# Patient Record
Sex: Female | Born: 1955 | Race: White | Hispanic: No | Smoking: Current every day smoker
Health system: Southern US, Community
[De-identification: ages and names within clinical notes are randomized; demographics above are authoritative.]

## PROBLEM LIST (undated history)

## (undated) DIAGNOSIS — S82409A Unspecified fracture of shaft of unspecified fibula, initial encounter for closed fracture: Secondary | ICD-10-CM

## (undated) DIAGNOSIS — I340 Nonrheumatic mitral (valve) insufficiency: Secondary | ICD-10-CM

## (undated) DIAGNOSIS — S82209A Unspecified fracture of shaft of unspecified tibia, initial encounter for closed fracture: Secondary | ICD-10-CM

## (undated) DIAGNOSIS — I4891 Unspecified atrial fibrillation: Secondary | ICD-10-CM

## (undated) DIAGNOSIS — K259 Gastric ulcer, unspecified as acute or chronic, without hemorrhage or perforation: Secondary | ICD-10-CM

## (undated) DIAGNOSIS — F418 Other specified anxiety disorders: Secondary | ICD-10-CM

## (undated) DIAGNOSIS — N289 Disorder of kidney and ureter, unspecified: Secondary | ICD-10-CM

## (undated) DIAGNOSIS — E785 Hyperlipidemia, unspecified: Secondary | ICD-10-CM

## (undated) DIAGNOSIS — D649 Anemia, unspecified: Secondary | ICD-10-CM

## (undated) DIAGNOSIS — K2980 Duodenitis without bleeding: Secondary | ICD-10-CM

## (undated) DIAGNOSIS — N1330 Unspecified hydronephrosis: Secondary | ICD-10-CM

## (undated) DIAGNOSIS — E43 Unspecified severe protein-calorie malnutrition: Secondary | ICD-10-CM

## (undated) DIAGNOSIS — D3500 Benign neoplasm of unspecified adrenal gland: Secondary | ICD-10-CM

## (undated) DIAGNOSIS — K852 Alcohol induced acute pancreatitis without necrosis or infection: Secondary | ICD-10-CM

## (undated) DIAGNOSIS — I10 Essential (primary) hypertension: Secondary | ICD-10-CM

## (undated) DIAGNOSIS — K589 Irritable bowel syndrome without diarrhea: Secondary | ICD-10-CM

## (undated) DIAGNOSIS — K859 Acute pancreatitis without necrosis or infection, unspecified: Secondary | ICD-10-CM

## (undated) DIAGNOSIS — I739 Peripheral vascular disease, unspecified: Secondary | ICD-10-CM

## (undated) DIAGNOSIS — R4701 Aphasia: Secondary | ICD-10-CM

## (undated) HISTORY — PX: ESOPHAGOGASTRODUODENOSCOPY: SHX1529

## (undated) HISTORY — PX: ABDOMINAL HYSTERECTOMY: SHX81

## (undated) HISTORY — DX: Peripheral vascular disease, unspecified: I73.9

## (undated) HISTORY — PX: BONE MARROW BIOPSY: SHX199

## (undated) HISTORY — PX: OTHER SURGICAL HISTORY: SHX169

## (undated) HISTORY — DX: Alcohol induced acute pancreatitis without necrosis or infection: K85.20

## (undated) HISTORY — DX: Gastric ulcer, unspecified as acute or chronic, without hemorrhage or perforation: K25.9

## (undated) HISTORY — DX: Unspecified atrial fibrillation: I48.91

## (undated) HISTORY — DX: Disorder of kidney and ureter, unspecified: N28.9

## (undated) HISTORY — DX: Acute pancreatitis without necrosis or infection, unspecified: K85.90

---

## 1999-11-17 ENCOUNTER — Encounter: Admission: RE | Admit: 1999-11-17 | Discharge: 1999-11-17 | Payer: Self-pay | Admitting: *Deleted

## 2000-12-19 ENCOUNTER — Encounter: Payer: Self-pay | Admitting: Internal Medicine

## 2000-12-19 ENCOUNTER — Encounter: Admission: RE | Admit: 2000-12-19 | Discharge: 2000-12-19 | Payer: Self-pay | Admitting: Internal Medicine

## 2002-08-10 ENCOUNTER — Encounter: Payer: Self-pay | Admitting: Family Medicine

## 2002-08-10 ENCOUNTER — Encounter: Admission: RE | Admit: 2002-08-10 | Discharge: 2002-08-10 | Payer: Self-pay | Admitting: Family Medicine

## 2002-08-14 ENCOUNTER — Encounter: Admission: RE | Admit: 2002-08-14 | Discharge: 2002-08-14 | Payer: Self-pay | Admitting: Family Medicine

## 2002-08-14 ENCOUNTER — Encounter: Payer: Self-pay | Admitting: Family Medicine

## 2002-09-15 ENCOUNTER — Encounter: Payer: Self-pay | Admitting: Urology

## 2002-09-15 ENCOUNTER — Ambulatory Visit (HOSPITAL_COMMUNITY): Admission: RE | Admit: 2002-09-15 | Discharge: 2002-09-15 | Payer: Self-pay | Admitting: Urology

## 2003-03-25 ENCOUNTER — Encounter: Admission: RE | Admit: 2003-03-25 | Discharge: 2003-03-25 | Payer: Self-pay | Admitting: Family Medicine

## 2003-04-03 DIAGNOSIS — K2980 Duodenitis without bleeding: Secondary | ICD-10-CM

## 2003-04-03 HISTORY — DX: Duodenitis without bleeding: K29.80

## 2003-04-06 ENCOUNTER — Encounter: Payer: Self-pay | Admitting: Internal Medicine

## 2003-04-06 DIAGNOSIS — K2980 Duodenitis without bleeding: Secondary | ICD-10-CM | POA: Insufficient documentation

## 2003-04-06 DIAGNOSIS — K449 Diaphragmatic hernia without obstruction or gangrene: Secondary | ICD-10-CM | POA: Insufficient documentation

## 2004-03-14 ENCOUNTER — Emergency Department (HOSPITAL_COMMUNITY): Admission: EM | Admit: 2004-03-14 | Discharge: 2004-03-14 | Payer: Self-pay | Admitting: Emergency Medicine

## 2004-05-09 ENCOUNTER — Emergency Department (HOSPITAL_COMMUNITY): Admission: EM | Admit: 2004-05-09 | Discharge: 2004-05-09 | Payer: Self-pay | Admitting: Emergency Medicine

## 2005-02-03 ENCOUNTER — Emergency Department (HOSPITAL_COMMUNITY): Admission: EM | Admit: 2005-02-03 | Discharge: 2005-02-03 | Payer: Self-pay | Admitting: Emergency Medicine

## 2005-03-27 ENCOUNTER — Encounter: Admission: RE | Admit: 2005-03-27 | Discharge: 2005-03-27 | Payer: Self-pay | Admitting: Nurse Practitioner

## 2006-04-02 DIAGNOSIS — F418 Other specified anxiety disorders: Secondary | ICD-10-CM

## 2006-04-02 DIAGNOSIS — K589 Irritable bowel syndrome without diarrhea: Secondary | ICD-10-CM

## 2006-04-02 HISTORY — DX: Irritable bowel syndrome, unspecified: K58.9

## 2006-04-02 HISTORY — DX: Other specified anxiety disorders: F41.8

## 2006-04-03 ENCOUNTER — Encounter: Admission: RE | Admit: 2006-04-03 | Discharge: 2006-07-02 | Payer: Self-pay | Admitting: Family Medicine

## 2006-12-31 ENCOUNTER — Ambulatory Visit: Payer: Self-pay | Admitting: Internal Medicine

## 2007-01-30 ENCOUNTER — Ambulatory Visit: Payer: Self-pay | Admitting: Internal Medicine

## 2007-06-10 DIAGNOSIS — F17200 Nicotine dependence, unspecified, uncomplicated: Secondary | ICD-10-CM

## 2007-06-10 DIAGNOSIS — K219 Gastro-esophageal reflux disease without esophagitis: Secondary | ICD-10-CM | POA: Insufficient documentation

## 2007-06-10 DIAGNOSIS — E785 Hyperlipidemia, unspecified: Secondary | ICD-10-CM

## 2007-06-10 DIAGNOSIS — I10 Essential (primary) hypertension: Secondary | ICD-10-CM | POA: Insufficient documentation

## 2007-06-10 DIAGNOSIS — N200 Calculus of kidney: Secondary | ICD-10-CM | POA: Insufficient documentation

## 2007-06-10 DIAGNOSIS — K589 Irritable bowel syndrome without diarrhea: Secondary | ICD-10-CM | POA: Insufficient documentation

## 2007-09-17 ENCOUNTER — Encounter: Payer: Self-pay | Admitting: Internal Medicine

## 2008-04-02 DIAGNOSIS — N1330 Unspecified hydronephrosis: Secondary | ICD-10-CM

## 2008-04-02 HISTORY — DX: Unspecified hydronephrosis: N13.30

## 2008-04-12 ENCOUNTER — Emergency Department (HOSPITAL_COMMUNITY): Admission: EM | Admit: 2008-04-12 | Discharge: 2008-04-12 | Payer: Self-pay | Admitting: Emergency Medicine

## 2008-06-23 ENCOUNTER — Encounter: Admission: RE | Admit: 2008-06-23 | Discharge: 2008-06-23 | Payer: Self-pay | Admitting: Internal Medicine

## 2008-12-14 ENCOUNTER — Emergency Department (HOSPITAL_COMMUNITY): Admission: EM | Admit: 2008-12-14 | Discharge: 2008-12-14 | Payer: Self-pay | Admitting: Emergency Medicine

## 2009-01-28 ENCOUNTER — Encounter: Admission: RE | Admit: 2009-01-28 | Discharge: 2009-01-28 | Payer: Self-pay | Admitting: Internal Medicine

## 2009-02-03 ENCOUNTER — Encounter: Admission: RE | Admit: 2009-02-03 | Discharge: 2009-02-03 | Payer: Self-pay | Admitting: Internal Medicine

## 2009-04-02 DIAGNOSIS — I10 Essential (primary) hypertension: Secondary | ICD-10-CM

## 2009-04-02 DIAGNOSIS — E785 Hyperlipidemia, unspecified: Secondary | ICD-10-CM

## 2009-04-02 HISTORY — DX: Essential (primary) hypertension: I10

## 2009-04-02 HISTORY — DX: Hyperlipidemia, unspecified: E78.5

## 2009-07-12 ENCOUNTER — Encounter: Admission: RE | Admit: 2009-07-12 | Discharge: 2009-07-12 | Payer: Self-pay | Admitting: Internal Medicine

## 2009-07-28 ENCOUNTER — Inpatient Hospital Stay (HOSPITAL_COMMUNITY): Admission: EM | Admit: 2009-07-28 | Discharge: 2009-07-29 | Payer: Self-pay | Admitting: Emergency Medicine

## 2009-07-29 ENCOUNTER — Encounter (INDEPENDENT_AMBULATORY_CARE_PROVIDER_SITE_OTHER): Payer: Self-pay | Admitting: Internal Medicine

## 2009-08-08 ENCOUNTER — Emergency Department (HOSPITAL_COMMUNITY): Admission: EM | Admit: 2009-08-08 | Discharge: 2009-08-08 | Payer: Self-pay | Admitting: Emergency Medicine

## 2009-11-14 ENCOUNTER — Encounter: Payer: Self-pay | Admitting: Cardiology

## 2009-12-19 ENCOUNTER — Emergency Department (HOSPITAL_COMMUNITY): Admission: EM | Admit: 2009-12-19 | Discharge: 2009-12-19 | Payer: Self-pay | Admitting: Emergency Medicine

## 2010-02-16 ENCOUNTER — Encounter: Payer: Self-pay | Admitting: Cardiology

## 2010-02-17 ENCOUNTER — Ambulatory Visit: Payer: Self-pay | Admitting: Cardiology

## 2010-02-17 DIAGNOSIS — Z9189 Other specified personal risk factors, not elsewhere classified: Secondary | ICD-10-CM | POA: Insufficient documentation

## 2010-02-17 DIAGNOSIS — R0681 Apnea, not elsewhere classified: Secondary | ICD-10-CM

## 2010-02-17 DIAGNOSIS — R0609 Other forms of dyspnea: Secondary | ICD-10-CM

## 2010-02-17 DIAGNOSIS — R0989 Other specified symptoms and signs involving the circulatory and respiratory systems: Secondary | ICD-10-CM

## 2010-02-20 ENCOUNTER — Telehealth: Payer: Self-pay | Admitting: Cardiology

## 2010-02-20 ENCOUNTER — Encounter: Payer: Self-pay | Admitting: Cardiology

## 2010-02-28 ENCOUNTER — Telehealth: Payer: Self-pay | Admitting: Cardiology

## 2010-03-02 DIAGNOSIS — R4701 Aphasia: Secondary | ICD-10-CM

## 2010-03-02 DIAGNOSIS — I340 Nonrheumatic mitral (valve) insufficiency: Secondary | ICD-10-CM

## 2010-03-02 HISTORY — DX: Aphasia: R47.01

## 2010-03-02 HISTORY — DX: Nonrheumatic mitral (valve) insufficiency: I34.0

## 2010-03-04 ENCOUNTER — Inpatient Hospital Stay (HOSPITAL_COMMUNITY)
Admission: EM | Admit: 2010-03-04 | Discharge: 2010-03-07 | Payer: Self-pay | Source: Home / Self Care | Attending: Internal Medicine | Admitting: Internal Medicine

## 2010-03-07 ENCOUNTER — Encounter (INDEPENDENT_AMBULATORY_CARE_PROVIDER_SITE_OTHER): Payer: Self-pay | Admitting: Internal Medicine

## 2010-03-22 ENCOUNTER — Emergency Department (HOSPITAL_COMMUNITY)
Admission: EM | Admit: 2010-03-22 | Discharge: 2010-03-22 | Payer: Self-pay | Source: Home / Self Care | Admitting: Emergency Medicine

## 2010-04-07 ENCOUNTER — Ambulatory Visit: Admit: 2010-04-07 | Payer: Self-pay

## 2010-04-07 ENCOUNTER — Ambulatory Visit: Admit: 2010-04-07 | Payer: Self-pay | Admitting: Cardiology

## 2010-05-02 NOTE — Assessment & Plan Note (Signed)
Summary: NP6/UNCONTROLLED BP/JML  Medications Added TOPROL XL 100 MG XR24H-TAB (METOPROLOL SUCCINATE) 1 by mouth daily ASPIRIN 81 MG  TABS (ASPIRIN) 1 by mouth daily LISINOPRIL 20 MG TABS (LISINOPRIL) one daily CYMBALTA 60 MG CPEP (DULOXETINE HCL) 1 by mouth daily KLONOPIN 0.5 MG TABS (CLONAZEPAM) 2 by mouth daily LIPITOR 10 MG TABS (ATORVASTATIN CALCIUM) 1 by mouth daily PROTONIX 40 MG SOLR (PANTOPRAZOLE SODIUM) 1 podaily PROZAC 40 MG CAPS (FLUOXETINE HCL) 1 by mouth daily DILTIAZEM HCL 120 MG TABS (DILTIAZEM HCL) 2  by mouth daily TRAZODONE HCL 100 MG TABS (TRAZODONE HCL) as needed CLONIDINE HCL 0.1 MG TABS (CLONIDINE HCL) one twice a day      Allergies Added: NKDA  Visit Type:  Initial Consult Primary Provider:  Tomi Bamberger, DNP  CC:  HTN.  History of Present Illness: The patient presents for difficult to control hypertension. She has apparently had this for some years. I did see an admission earlier this year hypertensive urgency with altered mental status. She had an echocardiogram which demonstrated no structural evidence of allergies other than mild LVH. He had CT and MRI of her head and neurology consult. Ultimately she was treated for her hypertension. However, this continues to be difficult to control. Recently she required clonidine and her primary provider office and had 0.1 mg daily added to her regimen. I did review other hospital records and seated in 2004 she had imaging of her kidneys and was found to have bilateral hydronephrosis. I am not sure if it was ever any treatment indicated for this or further evaluation. She denies other cardiac workup or history. She has multiple cardiovascular risk factors. However, she does not get substernal chest pressure, neck or arm discomfort. She has had some bleeding chest pain in the past.  She is mostly bothered by fatigue.  She says that she does not sleep well.  She does snore.  She will get dizzy occasionally and she relates this  to high blood pressure but she has not had any presyncope or syncope.  Current Medications (verified): 1)  Toprol Xl 100 Mg Xr24h-Tab (Metoprolol Succinate) .Marland Kitchen.. 1 By Mouth Daily 2)  Aspirin 81 Mg  Tabs (Aspirin) .Marland Kitchen.. 1 By Mouth Daily 3)  Lisinopril 10 Mg Tabs (Lisinopril) .Marland Kitchen.. 1 By Mouth Daily 4)  Cymbalta 60 Mg Cpep (Duloxetine Hcl) .Marland Kitchen.. 1 By Mouth Daily 5)  Klonopin 0.5 Mg Tabs (Clonazepam) .... 2 By Mouth Daily 6)  Lipitor 10 Mg Tabs (Atorvastatin Calcium) .Marland Kitchen.. 1 By Mouth Daily 7)  Protonix 40 Mg Solr (Pantoprazole Sodium) .Marland Kitchen.. 1 Podaily 8)  Prozac 40 Mg Caps (Fluoxetine Hcl) .Marland Kitchen.. 1 By Mouth Daily 9)  Diltiazem Hcl 120 Mg Tabs (Diltiazem Hcl) .... 2  By Mouth Daily 10)  Trazodone Hcl 100 Mg Tabs (Trazodone Hcl) .... As Needed  Allergies (verified): No Known Drug Allergies  Past History:  Past Medical History: DUODENITIS (ICD-535.60) HIATAL HERNIA (ICD-553.3) RENAL CALCULUS (ICD-592.0) HYPERLIPIDEMIA (ICD-272.4) HYPERTENSION (ICD-401.9) IBS (ICD-564.1) SMOKER (ICD-305.1) GERD (ICD-530.81)  Past Surgical History: Hysterectomy 1994 Tonsillectomy  Family History: Significant for father had diabetes, dyslipidemia, CVA  and  coronary artery disease.  Mother had CVA, diabetes mellitus, and  dyslipidemia.    Social History: Lives with husband and smokes a pack of cigarettes per  day and has been doing this for many years, at least greater than 10 years.  Drinks occasionally but denies illicit drug use.  She drives a school bus.  Review of Systems  Positive for headaches, joint pains. Negative for all other systems.  Vital Signs:  Patient profile:   55 year old female Height:      64 inches Weight:      188 pounds BMI:     32.39 Pulse rate:   76 / minute Resp:     16 per minute BP sitting:   154 / 102  (right arm)  Vitals Entered By: Marrion Coy, CNA (February 17, 2010 3:10 PM)  Physical Exam  General:  Well developed, well nourished, in no acute  distress. Head:  normocephalic and atraumatic Eyes:  PERRLA/EOM intact; conjunctiva and lids normal. Mouth:  Poor dentition. Oral mucosa normal. Neck:  Neck supple, no JVD. No masses, thyromegaly or abnormal cervical nodes. Chest Wall:  no deformities or breast masses noted Lungs:  Clear bilaterally to auscultation and percussion. Abdomen:  Bowel sounds positive; abdomen soft and non-tender without masses, organomegaly, or hernias noted. No hepatosplenomegaly. Msk:  Back normal, normal gait. Muscle strength and tone normal. Extremities:  No clubbing or cyanosis. Neurologic:  Alert and oriented x 3. Skin:  Intact without lesions or rashes. Cervical Nodes:  no significant adenopathy Axillary Nodes:  no significant adenopathy Inguinal Nodes:  no significant adenopathy Psych:  Normal affect.   Detailed Cardiovascular Exam  Neck    Carotids: Carotids full and equal bilaterally without bruits.  Carotids full and equal bilaterally without bruits.      Neck Veins: Normal, no JVD.  Normal, no JVD.    Heart    Inspection: no deformities or lifts noted.  no deformities or lifts noted.      Palpation: normal PMI with no thrills palpable.  normal PMI with no thrills palpable.      Auscultation: regular rate and rhythm, S1, S2 without murmurs, rubs, gallops, or clicks.  regular rate and rhythm, S1, S2 without murmurs, rubs, gallops, or clicks.    Vascular    Abdominal Aorta: no palpable masses, pulsations, or audible bruits.  no palpable masses, pulsations, or audible bruits.      Femoral Pulses: normal femoral pulses bilaterally.  normal femoral pulses bilaterally.      Pedal Pulses: normal pedal pulses bilaterally.  normal pedal pulses bilaterally.      Radial Pulses: normal radial pulses bilaterally.  normal radial pulses bilaterally.      Peripheral Circulation: no clubbing, cyanosis, or edema noted with normal capillary refill.  no clubbing, cyanosis, or edema noted with normal capillary  refill.     Impression & Recommendations:  Problem # 1:  HYPERTENSION (ICD-401.9) Her blood pressure has been somewhat difficult to control. I did review labs and she had a normal thyroid and urinalysis and basic metabolic profile except for a slightly low potassium recently. I am going to order renal Dopplers to evaluate a secondary etiology. Today I will increase her clonidine to b.i.d. and her lisinopril to 20 mg daily. She'll come back in about 2 weeks for a basic metabolic profile and followup. She will keep a blood pressure diary. Orders: Renal Artery Duplex (Renal Artery Duplex)  Problem # 2:  SMOKER (ICD-305.1) We discussed the need to stop smoking.  Problem # 3:  HYPERLIPIDEMIA (ICD-272.4) I reviewed recent blood tests and her LDL was 205. I will have her increase her Lipitor to 40 mg daily.  Problem # 4:  SNORING (ICD-786.09) The patient snorts his fatigue has headaches. I will send her for a sleep apnea study. This can contribute to difficult to control hypertension.  Other Orders: Sleep Disorder Referral (Sleep Disorder)  Patient Instructions: 1)  Your physician recommends that you schedule a follow-up appointment same day as renal doppler 2)  Your physician has recommended you make the following change in your medication: Increase Lisinopril to 20 mg a day and Clonidine 0.1 mg to twice a day 3)  Your physician has requested that you have a renal artery duplex. During this test, an ultrasound is used to evaluate blood flow to the kidneys. Allow one hour for this exam. Do not eat after midnight the day before and avoid carbonated beverages. Take your medications as you usually do. 4)  Your physician has recommended that you have a sleep study.  This test records several body functions during sleep, including:  brain activity, eye movement, oxygen and carbon dioxide blood levels, heart rate and rhythm, breathing rate and rhythm, the flow of air through your mouth and nose,  snoring, body muscle movements, and chest and belly movement. Prescriptions: CLONIDINE HCL 0.1 MG TABS (CLONIDINE HCL) one twice a day  #60 x 11   Entered by:   Charolotte Capuchin, RN   Authorized by:   Rollene Rotunda, MD, [facility]   Signed by:   Charolotte Capuchin, RN on 02/17/2010   Method used:   Electronically to        [facility] [phone]* (retail)       [address]       [place], [place]  [address]       Ph: [phone]       Fax: [phone]   RxID:   [id number] LISINOPRIL 20 MG TABS (LISINOPRIL) one daily  #30 x 11   Entered by:   Charolotte Capuchin, RN   Authorized by:   Rollene Rotunda, MD, [facility]   Signed by:   Charolotte Capuchin, RN on 02/17/2010   Method used:   Electronically to        [facility] [phone]* (retail)       [address]       [place], [place]  [address]       Ph: [phone]       Fax: [phone]   RxID:   [id number]

## 2010-05-02 NOTE — Letter (Signed)
Summary: Return To Work  [employer], Main Office  [address]   [place], [place] [address]   Phone: [phone]  Fax: [phone]        02/20/2010  TO: [facility] IT MAY CONCERN   RE: Krista Carter [id number] [id number]   The above named individual is under my medical care and may return to work on:  February 27, 2010 with no restrictions.  If you have any further questions or need additional information, please call.     Sincerely,    Charolotte Capuchin, RN for Dr Rollene Rotunda

## 2010-05-02 NOTE — Progress Notes (Signed)
Summary: addition to rtn ot work note   Phone Note Call from Patient   Caller: Patient Reason for Call: Talk to Nurse Summary of Call: pt needs rtn to work note dated the 18th, to state she can go back with no restrictions and fax to [phone] Initial call taken by: Glynda Jaeger,  February 20, 2010 11:20 AM  Follow-up for Phone Call        pt needs the note to be dates 02/27/2010 is previously discussed - just needs no restrictions added - done and will be faxed at the pts request Follow-up by: Charolotte Capuchin, RN,  February 20, 2010 12:24 PM

## 2010-05-02 NOTE — Letter (Signed)
Summary: Return To Work  [employer], Main Office  [address]   [place], [place] [address]   Phone: [phone]  Fax: [phone]        02/17/2010    TO: [facility] IT MAY CONCERN   RE: Krista Carter [id number] [id number]   The above named individual is under my medical care and may return to work on:  Monday February 27, 2010  If you have any further questions or need additional information, please call.       Sincerely,      Charolotte Capuchin, RN for Dr Rollene Rotunda

## 2010-05-02 NOTE — Progress Notes (Signed)
Summary: calling about get time off work due to /returning your call   Phone Note Call from Patient Call back at Home Phone [phone] Call back at [phone]    Caller: Patient Summary of Call: Pt calling about being taken out work due to her health,pt went back to work yesterday and had very bad pains Initial call taken by: Judie Grieve,  February 28, 2010 10:20 AM  Follow-up for Phone Call        [facility] for call back.  Layne Benton, RN, BSN  February 28, 2010 10:38 AM   Additional Follow-up for Phone Call Additional follow up Details #1::        Phone Call Completed SEE OTHER NOTE.PHONE CALL COMPLETED Scherrie Bateman, LPN  February 28, 2010 3:25 PM Additional Follow-up by: Roe Coombs,  February 28, 2010 1:21 PM

## 2010-05-02 NOTE — Progress Notes (Signed)
Summary: pt needs note for work   Phone Note Call from Patient Call back at [phone]   Caller: Patient Reason for Call: Talk to Nurse, Talk to Doctor Summary of Call: pt needs a note for work because she is not able to go back yet her arms, legs, and back still hurt Initial call taken by: Omer Jack,  February 28, 2010 3:04 PM  Follow-up for Phone Call        Phone Call Completed SPOKE WITH PT AND INFORMED PT TO CALL PMD FOR NOTE FOR WORK S/S LISTED IN MESSAGE ARE NOT HEART RELATED AND  WE ARE SEING PT FOR HYPERTENSION WHCIH WOULD NOT BE CAUSING LIMB AND BACK PAIN.PT VERBALIZED UNDERSTANDING. Follow-up by: Scherrie Bateman, LPN,  February 28, 2010 3:24 PM

## 2010-05-02 NOTE — Miscellaneous (Signed)
  Clinical Lists Changes  Observations: Added new observation of ECHOINTERP:  Left ventricle: The cavity size was normal. Wall thickness was   increased in a pattern of mild LVH. Systolic function was normal.   The estimated ejection fraction was in the range of 60% to 65%. Wall   motion was normal; there were no regional wall motion abnormalities.   Impressions:    - No cardiac source of emboli was indentified. (07/28/2009 9:43)      Echocardiogram  Procedure date:  07/28/2009  Findings:       Left ventricle: The cavity size was normal. Wall thickness was   increased in a pattern of mild LVH. Systolic function was normal.   The estimated ejection fraction was in the range of 60% to 65%. Wall   motion was normal; there were no regional wall motion abnormalities.   Impressions:    - No cardiac source of emboli was indentified.

## 2010-05-04 NOTE — Letter (Signed)
Summary: [facility] Office Note   [facility] Office Note   Imported By: Roderic Ovens 04/13/2010 14:03:51  _____________________________________________________________________  External Attachment:    Type:   Image     Comment:   External Document

## 2010-06-07 ENCOUNTER — Emergency Department (HOSPITAL_COMMUNITY)
Admission: EM | Admit: 2010-06-07 | Discharge: 2010-06-07 | Discharge status: Against Medical Advice | Payer: BC Managed Care – PPO | Attending: Emergency Medicine | Admitting: Emergency Medicine

## 2010-06-07 DIAGNOSIS — R110 Nausea: Secondary | ICD-10-CM | POA: Insufficient documentation

## 2010-06-07 DIAGNOSIS — H53149 Visual discomfort, unspecified: Secondary | ICD-10-CM | POA: Insufficient documentation

## 2010-06-07 DIAGNOSIS — R51 Headache: Secondary | ICD-10-CM | POA: Insufficient documentation

## 2010-06-13 LAB — HEPATIC FUNCTION PANEL
ALT: 12 U/L (ref 0–35)
Alkaline Phosphatase: 76 U/L (ref 39–117)
Bilirubin, Direct: 0.1 mg/dL (ref 0.0–0.3)
Indirect Bilirubin: 0.3 mg/dL (ref 0.3–0.9)
Total Bilirubin: 0.4 mg/dL (ref 0.3–1.2)
Total Protein: 6.1 g/dL (ref 6.0–8.3)

## 2010-06-13 LAB — BASIC METABOLIC PANEL
BUN: 11 mg/dL (ref 6–23)
BUN: 15 mg/dL (ref 6–23)
CO2: 28 mEq/L (ref 19–32)
Chloride: 100 mEq/L (ref 96–112)
Chloride: 103 mEq/L (ref 96–112)
Creatinine, Ser: 0.83 mg/dL (ref 0.4–1.2)
Glucose, Bld: 97 mg/dL (ref 70–99)
Potassium: 3 mEq/L — ABNORMAL LOW (ref 3.5–5.1)

## 2010-06-13 LAB — GLUCOSE, CAPILLARY
Glucose-Capillary: 103 mg/dL — ABNORMAL HIGH (ref 70–99)
Glucose-Capillary: 119 mg/dL — ABNORMAL HIGH (ref 70–99)
Glucose-Capillary: 215 mg/dL — ABNORMAL HIGH (ref 70–99)
Glucose-Capillary: 98 mg/dL (ref 70–99)
Glucose-Capillary: 99 mg/dL (ref 70–99)

## 2010-06-13 LAB — POCT CARDIAC MARKERS
CKMB, poc: <1 ng/mL — ABNORMAL LOW (ref 1.0–8.0)
Myoglobin, poc: 67.6 ng/mL (ref 12–200)
Troponin i, poc: <0.05 ng/mL (ref 0.00–0.09)

## 2010-06-13 LAB — URINALYSIS, ROUTINE W REFLEX MICROSCOPIC
Glucose, UA: NEGATIVE mg/dL
Specific Gravity, Urine: 1.012 (ref 1.005–1.030)
pH: 5.5 (ref 5.0–8.0)

## 2010-06-13 LAB — LIPID PANEL
Cholesterol: 165 mg/dL (ref 0–200)
Cholesterol: 165 mg/dL (ref 0–200)
LDL Cholesterol: 95 mg/dL (ref 0–99)
LDL Cholesterol: 98 mg/dL (ref 0–99)
Total CHOL/HDL Ratio: 3.5 RATIO

## 2010-06-13 LAB — CBC
HCT: 40.2 % (ref 36.0–46.0)
MCHC: 33.8 g/dL (ref 30.0–36.0)
MCV: 92 fL (ref 78.0–100.0)
RDW: 13.8 % (ref 11.5–15.5)

## 2010-06-13 LAB — DIFFERENTIAL
Basophils Absolute: 0.1 10*3/uL (ref 0.0–0.1)
Basophils Relative: 1 % (ref 0–1)
Eosinophils Relative: 5 % (ref 0–5)
Monocytes Absolute: 0.5 10*3/uL (ref 0.1–1.0)

## 2010-06-13 LAB — COMPREHENSIVE METABOLIC PANEL
Alkaline Phosphatase: 76 U/L (ref 39–117)
BUN: 14 mg/dL (ref 6–23)
Chloride: 97 mEq/L (ref 96–112)
Glucose, Bld: 121 mg/dL — ABNORMAL HIGH (ref 70–99)
Potassium: 3.4 mEq/L — ABNORMAL LOW (ref 3.5–5.1)
Total Bilirubin: 0.4 mg/dL (ref 0.3–1.2)
Total Protein: 6 g/dL (ref 6.0–8.3)

## 2010-06-13 LAB — PROTIME-INR: INR: 1.01 (ref 0.00–1.49)

## 2010-06-20 LAB — CBC
HCT: 39 % (ref 36.0–46.0)
HCT: 39.2 % (ref 36.0–46.0)
Hemoglobin: 13.4 g/dL (ref 12.0–15.0)
Hemoglobin: 13.5 g/dL (ref 12.0–15.0)
Hemoglobin: 13.6 g/dL (ref 12.0–15.0)
MCHC: 34.5 g/dL (ref 30.0–36.0)
MCHC: 34.9 g/dL (ref 30.0–36.0)
MCV: 93.1 fL (ref 78.0–100.0)
MCV: 93.2 fL (ref 78.0–100.0)
Platelets: 167 10*3/uL (ref 150–400)
Platelets: 173 10*3/uL (ref 150–400)
RBC: 4.12 MIL/uL (ref 3.87–5.11)
RBC: 4.19 MIL/uL (ref 3.87–5.11)
RBC: 4.21 MIL/uL (ref 3.87–5.11)
RDW: 14.3 % (ref 11.5–15.5)
RDW: 14.6 % (ref 11.5–15.5)
WBC: 5.4 10*3/uL (ref 4.0–10.5)
WBC: 6.3 10*3/uL (ref 4.0–10.5)
WBC: 9.2 10*3/uL (ref 4.0–10.5)

## 2010-06-20 LAB — COMPREHENSIVE METABOLIC PANEL
ALT: 15 U/L (ref 0–35)
ALT: 15 U/L (ref 0–35)
AST: 15 U/L (ref 0–37)
AST: 19 U/L (ref 0–37)
Albumin: 3.5 g/dL (ref 3.5–5.2)
Albumin: 3.5 g/dL (ref 3.5–5.2)
Alkaline Phosphatase: 72 U/L (ref 39–117)
Alkaline Phosphatase: 73 U/L (ref 39–117)
BUN: 11 mg/dL (ref 6–23)
BUN: 11 mg/dL (ref 6–23)
CO2: 26 mEq/L (ref 19–32)
CO2: 26 mEq/L (ref 19–32)
Calcium: 8.8 mg/dL (ref 8.4–10.5)
Calcium: 8.9 mg/dL (ref 8.4–10.5)
Chloride: 105 mEq/L (ref 96–112)
Chloride: 109 mEq/L (ref 96–112)
Creatinine, Ser: 0.73 mg/dL (ref 0.4–1.2)
Creatinine, Ser: 0.82 mg/dL (ref 0.4–1.2)
GFR calc Af Amer: >60 mL/min (ref >60)
GFR calc Af Amer: >60 mL/min (ref >60)
GFR calc non Af Amer: >60 mL/min (ref >60)
GFR calc non Af Amer: >60 mL/min (ref >60)
Glucose, Bld: 91 mg/dL (ref 70–99)
Glucose, Bld: 93 mg/dL (ref 70–99)
Potassium: 3.2 mEq/L — ABNORMAL LOW (ref 3.5–5.1)
Potassium: 3.7 mEq/L (ref 3.5–5.1)
Sodium: 138 mEq/L (ref 135–145)
Sodium: 143 mEq/L (ref 135–145)
Total Bilirubin: 0.5 mg/dL (ref 0.3–1.2)
Total Bilirubin: 0.7 mg/dL (ref 0.3–1.2)
Total Protein: 6.4 g/dL (ref 6.0–8.3)
Total Protein: 6.4 g/dL (ref 6.0–8.3)

## 2010-06-20 LAB — POCT I-STAT, CHEM 8
BUN: 14 mg/dL (ref 6–23)
Calcium, Ion: 1.1 mmol/L — ABNORMAL LOW (ref 1.12–1.32)
Calcium, Ion: 1.02 mmol/L — ABNORMAL LOW (ref 1.12–1.32)
Chloride: 105 mEq/L (ref 96–112)
Chloride: 110 mEq/L (ref 96–112)
Creatinine, Ser: 0.8 mg/dL (ref 0.4–1.2)
Creatinine, Ser: 0.8 mg/dL (ref 0.4–1.2)
Glucose, Bld: 98 mg/dL (ref 70–99)
Glucose, Bld: 79 mg/dL (ref 70–99)
HCT: 45 % (ref 36.0–46.0)
HCT: 41 % (ref 36.0–46.0)
Hemoglobin: 13.9 g/dL (ref 12.0–15.0)
Potassium: 3.2 mEq/L — ABNORMAL LOW (ref 3.5–5.1)
Sodium: 142 mEq/L (ref 135–145)
TCO2: 28 mmol/L (ref 0–100)

## 2010-06-20 LAB — URINALYSIS, ROUTINE W REFLEX MICROSCOPIC
Bilirubin Urine: NEGATIVE
Glucose, UA: NEGATIVE mg/dL
Nitrite: NEGATIVE
Specific Gravity, Urine: 1.012 (ref 1.005–1.030)
pH: 5.5 (ref 5.0–8.0)

## 2010-06-20 LAB — LIPID PANEL
Cholesterol: 172 mg/dL (ref 0–200)
Cholesterol: 180 mg/dL (ref 0–200)
HDL: 54 mg/dL (ref >39)
LDL Cholesterol: 102 mg/dL — ABNORMAL HIGH (ref 0–99)
Total CHOL/HDL Ratio: 3.3 RATIO
Total CHOL/HDL Ratio: 3.9 RATIO
Triglycerides: 121 mg/dL (ref <150)
VLDL: 24 mg/dL (ref 0–40)

## 2010-06-20 LAB — DIFFERENTIAL
Basophils Absolute: 0.1 10*3/uL (ref 0.0–0.1)
Basophils Relative: 1 % (ref 0–1)
Basophils Relative: 1 % (ref 0–1)
Eosinophils Absolute: 0.2 10*3/uL (ref 0.0–0.7)
Eosinophils Relative: 3 % (ref 0–5)
Lymphocytes Relative: 38 % (ref 12–46)
Lymphocytes Relative: 39 % (ref 12–46)
Lymphs Abs: 2.1 10*3/uL (ref 0.7–4.0)
Lymphs Abs: 2.4 10*3/uL (ref 0.7–4.0)
Monocytes Absolute: 0.3 10*3/uL (ref 0.1–1.0)
Monocytes Relative: 6 % (ref 3–12)
Monocytes Relative: 7 % (ref 3–12)
Neutro Abs: 2.7 10*3/uL (ref 1.7–7.7)
Neutro Abs: 3.3 10*3/uL (ref 1.7–7.7)
Neutrophils Relative %: 50 % (ref 43–77)
Neutrophils Relative %: 53 % (ref 43–77)

## 2010-06-20 LAB — POCT CARDIAC MARKERS: Troponin i, poc: <0.05 ng/mL (ref 0.00–0.09)

## 2010-06-20 LAB — GLUCOSE, CAPILLARY: Glucose-Capillary: 94 mg/dL (ref 70–99)

## 2010-06-20 LAB — TSH: TSH: 0.442 u[IU]/mL (ref 0.350–4.500)

## 2010-06-27 ENCOUNTER — Emergency Department (HOSPITAL_COMMUNITY)
Admission: EM | Admit: 2010-06-27 | Discharge: 2010-06-27 | Discharge status: Against Medical Advice | Payer: BC Managed Care – PPO | Attending: Emergency Medicine | Admitting: Emergency Medicine

## 2010-06-27 ENCOUNTER — Encounter (HOSPITAL_COMMUNITY)
Admission: RE | Admit: 2010-06-27 | Discharge: 2010-06-27 | Disposition: A | Discharge status: Home / Self Care | Payer: BC Managed Care – PPO | Source: Ambulatory Visit | Attending: Orthopedic Surgery | Admitting: Orthopedic Surgery

## 2010-06-27 DIAGNOSIS — Z01812 Encounter for preprocedural laboratory examination: Secondary | ICD-10-CM | POA: Insufficient documentation

## 2010-06-27 DIAGNOSIS — I10 Essential (primary) hypertension: Secondary | ICD-10-CM | POA: Insufficient documentation

## 2010-06-27 LAB — COMPREHENSIVE METABOLIC PANEL
ALT: 18 U/L (ref 0–35)
AST: 21 U/L (ref 0–37)
Alkaline Phosphatase: 95 U/L (ref 39–117)
CO2: 27 mEq/L (ref 19–32)
Chloride: 104 mEq/L (ref 96–112)
GFR calc non Af Amer: >60 mL/min (ref >60)
Glucose, Bld: 108 mg/dL — ABNORMAL HIGH (ref 70–99)
Potassium: 4.3 mEq/L (ref 3.5–5.1)
Sodium: 140 mEq/L (ref 135–145)
Total Bilirubin: 0.4 mg/dL (ref 0.3–1.2)

## 2010-06-27 LAB — DIFFERENTIAL
Basophils Absolute: 0.1 10*3/uL (ref 0.0–0.1)
Lymphocytes Relative: 33 % (ref 12–46)
Monocytes Absolute: 0.4 10*3/uL (ref 0.1–1.0)
Neutro Abs: 4.3 10*3/uL (ref 1.7–7.7)
Neutrophils Relative %: 60 % (ref 43–77)

## 2010-06-27 LAB — CBC
HCT: 44.7 % (ref 36.0–46.0)
Hemoglobin: 15.2 g/dL — ABNORMAL HIGH (ref 12.0–15.0)
MCHC: 34 g/dL (ref 30.0–36.0)
RBC: 4.97 MIL/uL (ref 3.87–5.11)
WBC: 7.2 10*3/uL (ref 4.0–10.5)

## 2010-06-27 LAB — SURGICAL PCR SCREEN: Staphylococcus aureus: NEGATIVE

## 2010-07-02 HISTORY — PX: ORIF NONUNION HUMERUS FRACTURE: SUR945

## 2010-07-05 ENCOUNTER — Encounter (HOSPITAL_COMMUNITY)
Admission: RE | Admit: 2010-07-05 | Discharge: 2010-07-05 | Disposition: A | Discharge status: Home / Self Care | Payer: BC Managed Care – PPO | Source: Ambulatory Visit | Attending: Orthopedic Surgery | Admitting: Orthopedic Surgery

## 2010-07-05 LAB — DIFFERENTIAL
Basophils Absolute: 0.1 10*3/uL (ref 0.0–0.1)
Basophils Relative: 1 % (ref 0–1)
Eosinophils Relative: 3 % (ref 0–5)
Monocytes Absolute: 0.5 10*3/uL (ref 0.1–1.0)
Monocytes Relative: 6 % (ref 3–12)

## 2010-07-05 LAB — COMPREHENSIVE METABOLIC PANEL
ALT: 13 U/L (ref 0–35)
CO2: 29 mEq/L (ref 19–32)
Calcium: 9.4 mg/dL (ref 8.4–10.5)
Creatinine, Ser: 0.77 mg/dL (ref 0.4–1.2)
GFR calc Af Amer: >60 mL/min (ref >60)
GFR calc non Af Amer: >60 mL/min (ref >60)
Glucose, Bld: 110 mg/dL — ABNORMAL HIGH (ref 70–99)
Sodium: 142 mEq/L (ref 135–145)
Total Protein: 6.6 g/dL (ref 6.0–8.3)

## 2010-07-05 LAB — CBC
HCT: 42.2 % (ref 36.0–46.0)
MCH: 30.6 pg (ref 26.0–34.0)
MCHC: 33.9 g/dL (ref 30.0–36.0)
RDW: 14.3 % (ref 11.5–15.5)

## 2010-07-07 LAB — URINALYSIS, ROUTINE W REFLEX MICROSCOPIC
Glucose, UA: NEGATIVE mg/dL
Ketones, ur: NEGATIVE mg/dL
Nitrite: NEGATIVE
Specific Gravity, Urine: 1.016 (ref 1.005–1.030)
pH: 5.5 (ref 5.0–8.0)

## 2010-07-07 LAB — DIFFERENTIAL
Basophils Absolute: 0 10*3/uL (ref 0.0–0.1)
Basophils Relative: 0 % (ref 0–1)
Eosinophils Absolute: 0 10*3/uL (ref 0.0–0.7)
Monocytes Relative: 3 % (ref 3–12)
Neutro Abs: 9.7 10*3/uL — ABNORMAL HIGH (ref 1.7–7.7)
Neutrophils Relative %: 91 % — ABNORMAL HIGH (ref 43–77)

## 2010-07-07 LAB — COMPREHENSIVE METABOLIC PANEL
ALT: 12 U/L (ref 0–35)
Alkaline Phosphatase: 91 U/L (ref 39–117)
BUN: 17 mg/dL (ref 6–23)
CO2: 25 mEq/L (ref 19–32)
Chloride: 106 mEq/L (ref 96–112)
GFR calc non Af Amer: 49 mL/min — ABNORMAL LOW (ref >60)
Glucose, Bld: 161 mg/dL — ABNORMAL HIGH (ref 70–99)
Potassium: 2.8 mEq/L — ABNORMAL LOW (ref 3.5–5.1)
Sodium: 141 mEq/L (ref 135–145)
Total Bilirubin: 0.3 mg/dL (ref 0.3–1.2)
Total Protein: 6.1 g/dL (ref 6.0–8.3)

## 2010-07-07 LAB — CBC
HCT: 38.9 % (ref 36.0–46.0)
Hemoglobin: 13.4 g/dL (ref 12.0–15.0)
RBC: 4.18 MIL/uL (ref 3.87–5.11)
WBC: 10.6 10*3/uL — ABNORMAL HIGH (ref 4.0–10.5)

## 2010-07-11 ENCOUNTER — Inpatient Hospital Stay (HOSPITAL_COMMUNITY)
Admission: RE | Admit: 2010-07-11 | Discharge: 2010-07-13 | DRG: 219 | Disposition: A | Discharge status: Home / Self Care | Payer: BC Managed Care – PPO | Source: Ambulatory Visit | Attending: Orthopedic Surgery | Admitting: Orthopedic Surgery

## 2010-07-11 ENCOUNTER — Other Ambulatory Visit: Payer: Self-pay | Admitting: Orthopedic Surgery

## 2010-07-11 DIAGNOSIS — W19XXXS Unspecified fall, sequela: Secondary | ICD-10-CM

## 2010-07-11 DIAGNOSIS — E785 Hyperlipidemia, unspecified: Secondary | ICD-10-CM | POA: Diagnosis present

## 2010-07-11 DIAGNOSIS — F17200 Nicotine dependence, unspecified, uncomplicated: Secondary | ICD-10-CM | POA: Diagnosis present

## 2010-07-11 DIAGNOSIS — J45909 Unspecified asthma, uncomplicated: Secondary | ICD-10-CM | POA: Diagnosis present

## 2010-07-11 DIAGNOSIS — E669 Obesity, unspecified: Secondary | ICD-10-CM | POA: Diagnosis present

## 2010-07-11 DIAGNOSIS — F341 Dysthymic disorder: Secondary | ICD-10-CM | POA: Diagnosis present

## 2010-07-11 DIAGNOSIS — E119 Type 2 diabetes mellitus without complications: Secondary | ICD-10-CM | POA: Diagnosis present

## 2010-07-11 DIAGNOSIS — K219 Gastro-esophageal reflux disease without esophagitis: Secondary | ICD-10-CM | POA: Diagnosis present

## 2010-07-11 DIAGNOSIS — S42309S Unspecified fracture of shaft of humerus, unspecified arm, sequela: Secondary | ICD-10-CM

## 2010-07-11 DIAGNOSIS — Z79899 Other long term (current) drug therapy: Secondary | ICD-10-CM

## 2010-07-11 DIAGNOSIS — I10 Essential (primary) hypertension: Secondary | ICD-10-CM | POA: Diagnosis present

## 2010-07-11 DIAGNOSIS — Z7982 Long term (current) use of aspirin: Secondary | ICD-10-CM

## 2010-07-11 DIAGNOSIS — Z01812 Encounter for preprocedural laboratory examination: Secondary | ICD-10-CM

## 2010-07-11 DIAGNOSIS — IMO0002 Reserved for concepts with insufficient information to code with codable children: Principal | ICD-10-CM | POA: Diagnosis present

## 2010-07-11 LAB — GLUCOSE, CAPILLARY: Glucose-Capillary: 93 mg/dL (ref 70–99)

## 2010-07-11 LAB — HEPATIC FUNCTION PANEL
AST: 12 U/L (ref 0–37)
Albumin: 3 g/dL — ABNORMAL LOW (ref 3.5–5.2)
Alkaline Phosphatase: 76 U/L (ref 39–117)
Total Bilirubin: 0.5 mg/dL (ref 0.3–1.2)
Total Protein: 5 g/dL — ABNORMAL LOW (ref 6.0–8.3)

## 2010-07-11 LAB — CBC
HCT: 32.7 % — ABNORMAL LOW (ref 36.0–46.0)
Hemoglobin: 10.6 g/dL — ABNORMAL LOW (ref 12.0–15.0)
MCHC: 32.4 g/dL (ref 30.0–36.0)
WBC: 11.3 10*3/uL — ABNORMAL HIGH (ref 4.0–10.5)

## 2010-07-11 LAB — BASIC METABOLIC PANEL
CO2: 27 mEq/L (ref 19–32)
Chloride: 103 mEq/L (ref 96–112)
Creatinine, Ser: 0.63 mg/dL (ref 0.4–1.2)
GFR calc Af Amer: >60 mL/min (ref >60)
Glucose, Bld: 137 mg/dL — ABNORMAL HIGH (ref 70–99)
Sodium: 138 mEq/L (ref 135–145)

## 2010-07-12 LAB — CBC
HCT: 30.9 % — ABNORMAL LOW (ref 36.0–46.0)
MCHC: 33 g/dL (ref 30.0–36.0)
MCV: 90.6 fL (ref 78.0–100.0)
Platelets: 172 10*3/uL (ref 150–400)
RDW: 14.4 % (ref 11.5–15.5)

## 2010-07-12 LAB — HEMOGLOBIN A1C
Hgb A1c MFr Bld: 5.9 % — ABNORMAL HIGH (ref <5.7)
Mean Plasma Glucose: 123 mg/dL — ABNORMAL HIGH (ref <117)

## 2010-07-12 LAB — TSH: TSH: 0.486 u[IU]/mL (ref 0.350–4.500)

## 2010-07-12 LAB — GLUCOSE, CAPILLARY: Glucose-Capillary: 113 mg/dL — ABNORMAL HIGH (ref 70–99)

## 2010-07-12 LAB — BASIC METABOLIC PANEL
BUN: 8 mg/dL (ref 6–23)
Calcium: 8.3 mg/dL — ABNORMAL LOW (ref 8.4–10.5)
Creatinine, Ser: 0.78 mg/dL (ref 0.4–1.2)
GFR calc non Af Amer: >60 mL/min (ref >60)
Glucose, Bld: 121 mg/dL — ABNORMAL HIGH (ref 70–99)

## 2010-07-13 LAB — GLUCOSE, CAPILLARY: Glucose-Capillary: 124 mg/dL — ABNORMAL HIGH (ref 70–99)

## 2010-07-13 LAB — CBC
HCT: 30.4 % — ABNORMAL LOW (ref 36.0–46.0)
Hemoglobin: 10.1 g/dL — ABNORMAL LOW (ref 12.0–15.0)
MCH: 30 pg (ref 26.0–34.0)
MCV: 90.2 fL (ref 78.0–100.0)
RBC: 3.37 MIL/uL — ABNORMAL LOW (ref 3.87–5.11)

## 2010-07-14 LAB — GLUCOSE, CAPILLARY: Glucose-Capillary: 102 mg/dL — ABNORMAL HIGH (ref 70–99)

## 2010-07-17 LAB — DIFFERENTIAL
Basophils Absolute: 0 10*3/uL (ref 0.0–0.1)
Eosinophils Absolute: 0 10*3/uL (ref 0.0–0.7)
Eosinophils Relative: 0 % (ref 0–5)
Lymphocytes Relative: 23 % (ref 12–46)

## 2010-07-17 LAB — BASIC METABOLIC PANEL
BUN: 11 mg/dL (ref 6–23)
Creatinine, Ser: 0.68 mg/dL (ref 0.4–1.2)
GFR calc non Af Amer: >60 mL/min (ref >60)
Glucose, Bld: 101 mg/dL — ABNORMAL HIGH (ref 70–99)
Potassium: 3.2 mEq/L — ABNORMAL LOW (ref 3.5–5.1)

## 2010-07-17 LAB — CBC
HCT: 42.5 % (ref 36.0–46.0)
MCV: 89.3 fL (ref 78.0–100.0)
Platelets: 203 10*3/uL (ref 150–400)
RDW: 14.3 % (ref 11.5–15.5)

## 2010-07-17 LAB — GLUCOSE, CAPILLARY: Glucose-Capillary: 103 mg/dL — ABNORMAL HIGH (ref 70–99)

## 2010-07-17 NOTE — Op Note (Signed)
Krista Carter, CORRON NO.:  [id number]  MEDICAL RECORD NO.:  [id number]           PATIENT TYPE:  LOCATION:                                 FACILITY:  PHYSICIAN:  Dionne Ano. Emelie Newsom, M.D.DATE OF BIRTH:  03/01/1956  DATE OF PROCEDURE: DATE OF DISCHARGE:                              OPERATIVE REPORT   PREOPERATIVE DIAGNOSIS:  Nonunion, right humerus.  POSTOPERATIVE DIAGNOSIS:  Nonunion, right humerus.  PROCEDURE: 1. Open reduction and internal fixation, nonunion, with 12 hole DePuy     plate, right humerus.  This was takedown of a nonunion subsequent     plating and bone grafting. 2. Bone grafted with cancellous bone chips and OP1 implantation.  This     was a long bone type repair with OP1 implant and cancellus bone     chips. 3. Stress radiography.  SURGEON:  Dionne Ano. [name], MD.  ASSISTANT:  [name], PA-C.  COMPLICATIONS:  None.  ANESTHESIA:  General with preoperative block by Dr. [name].  DRAINS:  One.  TOURNIQUET TIME:  Zero.  ESTIMATED BLOOD LOSS:  250 mL.  INDICATIONS FOR PROCEDURE:  This patient is an unfortunate female, has a history of diabetes tobacco abuse, hypertension, and other medical problems, who sustained a fracture about the humerus.  She was seen by my partner Dr. [name] and treated, ultimately signs of nonunion ensued and the patient was referred for evaluation.  We discussed with the patient our options and recommended the above-mentioned procedure.  She understand and accept the risks, benefits of bleeding, infection, anesthesia, damage to normal structures, and failure of surgery to accomplish its intended goals of relieving symptoms and restoring function.  With this mind, she desired to proceed.  OPERATIVE PROCEDURE:  The patient was administered anesthesia.  She was given a block in the preop holding area by Dr. [name].  She was counseled given preoperative antibiotics in the form of Ancef and  then taken to the operative arena and underwent a general anesthetic.  She was laid supine, appropriately padded, and the left hip was draped out for possible bone graft as well as the right upper extremity.  I have performed a two separate Betadine scrub and paints from shoulder to the fingertips and isolated the sterile field.  Once this was done, the patient underwent incision after outline marks were made and Ioban was placed over the skin.  Once the incision was made, dissection was carried down.  Anterior approach was created.  This was done by sweeping the biceps and cephalic vein medially.  This identified the brachialis, which was split midline in the internervous plane.  I then exposed the fracture.  Once this was done, I then encountered the high level of the injury in terms of the bony nonunion and extended the incision.  I created an interval between the PEG and the deltoid and exposed this very carefully.  I exposed the fracture site where there was a pseudoarthrosis.  Joint type fluid/serosanguineous fluid was suctioned out, and then following this, I then performed very careful and cautious take down the nonunion and exposed good  bleeding bone on both the proximal and distal ends. Once this was done, I noted that this was a long oblique type fracture and placed a 12 hole plate and compress the T-bones.  Following this, I placed a large amount of cancellous bone graft in the defect proximally and then performed a compression type plating technique.  The patient had great interdigitation of the opposing bones.  The 12 hole plate was applied in compression mode without difficulty.  She tolerated this quite well.  I was very please with stability.  X-rays were taken, which showed excellent position of screw and plate.  Following this, we placed OP1 bone graft.  This bone graft was chosen to try and maximize the ability of the fracture site to heal.  This was mixed with  cancellus bone graft.  Following mixing the OP1 implant with cancellous bone graft and placing this in the site, we then laid it on top of the fracture site.  Once this was done, the patient was closed deeply with 0 Vicryl and superficially with 3-0 Vicryl and the subcu followed by subcuticular stitch.  I should note greater than 3 liters of saline were placed prior to bone graft placement and the area was washed out nicely.  The radial nerve was not identified but was kept in mind at all times as was the medial neurovascular bundle (brachial artery median nerve).  I placed a drain prior to definitive closure of course and this will be removed postop day #1.  She was dressed sterilely and placed in the coaptation splint.  She tolerated the procedure well and no complicating features.  All sponge, needle, and instrument counts were reported as correct.  The patient will be monitored in the recovery room, will admitted to step down and given her history of multiple medical problems.  We are aggressive with the bone grafting technique given her history of diabetes, tobacco abuse, hypertension, and other medical problems.  We are going to plan for suture removal in 12-14 days, serial radiographs, gentle interval range of motion including Codman and pendulum exercises to try to decrease the risk of a frozen shoulder which she is going to have a high risk for my opinion.  I have discussed these issues.  I did not want her lifting, gripping, pushing, pulling, twisting, only gentle range of motion.  These notes had been discussed.  X-rays looked excellent.  We are pleased this.  She had excellent pulse.  No problems at the conclusion of the case.     Dionne Ano. [name], M.D.     [facility]  D:  07/11/2010  T:  07/12/2010  Job:  [id number]  Electronically Signed by Dominica Severin M.D. on 07/17/2010 08:26:13 PM

## 2010-08-13 NOTE — Consult Note (Signed)
NAMECARRIE, USERY NO.:  [id number]  MEDICAL RECORD NO.:  [id number]           PATIENT TYPE:  I  LOCATION:  3301                         FACILITY:  [facility]  PHYSICIAN:  Eduard Clos, MDDATE OF BIRTH:  12/27/1955  DATE OF CONSULTATION: DATE OF DISCHARGE:                                CONSULTATION   PRIMARY CARE PHYSICIAN:  Dr. Tomi Bamberger.  Consult requested by Dionne Ano. [name], MD for medical management of her medical issues.  CHIEF COMPLAINT:  Status post right ORIF and has diabetes, hypertension, and has been consulted for management of diabetes, hypertension.  HISTORY OF PRESENT ILLNESS:  A 55 year old female with a history of nonhealing right humerus fracture since fall, and has had a right ORIF today.  The patient has a known history of diabetes, hypertension, anxiety, and has been recently restarted on medication, which she was not taking for some time.  At this time, the patient is status post ORIF and is alert, awake, status post intubation. She is following commands, has no complaint.  Denies any chest pain, shortness of breath, any nausea, vomiting, abdominal pain.  Denies any fever or chills, cough, headache, or visual symptoms.  The patient at this time is on her regular home medication except for metformin.  PAST MEDICAL HISTORY:  Hypertension, diabetes mellitus type 2, hyperlipidemia, COPD, ongoing tobacco abuse.  PAST SURGICAL HISTORY:  Partial hysterectomy and tonsillectomy.  MEDICATIONS PRIOR TO ADMISSION: 1. Fluoxetine 40 mg p.o. daily. 2. Diltiazem XT 120 mg p.o. daily. 3. Lipitor 20 mg daily. 4. Albuterol inhaler. 5. Toprol-XL 100 mg daily. 6. Protonix. 7. Metformin 5 mg daily. 8. Lisinopril/hydrochlorothiazide 20/25 mg daily. 9. Klonopin 2 mg p.o. daily. 10.Aspirin 81 mg p.o. daily.  ALLERGIES:  No known drug allergies.  FAMILY HISTORY:  Significant for the patient's mother having renal cell carcinoma and also  diabetes.  SOCIAL HISTORY:  The patient lives with her husband.  Smokes cigarettes. Denies any alcohol or drug abuse, has been advised to quit smoking.  REVIEW OF SYSTEMS:  As per history of present illness, nothing else significant.  PHYSICAL EXAMINATION:  GENERAL:  The patient examined at bedside, not in acute distress. VITAL SIGNS:  Blood pressure 140/70, pulse 70 per minute, temperature 97.4, respirations 18 per minute, O2 sat 100%. HEENT:  Anicteric.  No pallor.  No discharge from ears, eyes, nose, or mouth. CHEST:  Bilateral air entry present.  No rhonchi.  No crepitation. HEART:  S1 and S2 heard. ABDOMEN:  Soft, nontender.  Bowel sounds heard. CNS:  The patient is alert, awake, and oriented to time, place, and person.  She is able to move upper and lower extremities.  The right upper is in a sling. EXTREMITIES:  There is no acute ischemic changes, cyanosis, or clubbing.  Labs done on July 05, 2010, shows CBC, WBC of 8, hemoglobin is 14.3, hematocrit is 42.2, platelets 216.  Basic metabolic panel shows sodium 142, potassium 4.4, chloride 105, carbon dioxide 29, glucose 110, BUN 12, creatinine 0.7, alkaline phosphatase 84, AST 14, ALT 13, albumin 3.9, calcium 9.4.  ASSESSMENT: 1. Status post  right open reduction and internal fixation for     nonhealing fracture of the right humerus. 2. Diabetes mellitus type 2. 3. Hypertension. 4. Ongoing tobacco abuse. 5. History of hyperlipidemia. 6. History of anxiety and depression.  PLAN: 1. At this time for her diabetes, we will continue CBG checks, sliding     scale coverage.  At this time, we will hold off metformin. 2. For hypertension, we will continue with her diltiazem and     lisinopril/hydrochlorothiazide.  We will place the patient on     p.r.n. IV hydralazine for blood pressure more than 160. 3. We will continue her antidepressants and anti-hyperlipidemia     medication. 4. I am going to check BMET, LFTs, CBC,  myoglobin, and hemoglobin A1c.  Thanks for involving [place] in the patient's care.  We will follow along with you.     Eduard Clos, MD     [facility]  D:  07/11/2010  T:  07/11/2010  Job:  [id number]  cc:   Dr. Tomi Bamberger  Electronically Signed by Midge Minium MD on 08/13/2010 08:04:06 AM

## 2010-08-15 NOTE — Assessment & Plan Note (Signed)
[facility]                         GASTROENTEROLOGY OFFICE NOTE   DAIELLE, MELCHER                        MRN:          [id number]  DATE:12/31/2006                            DOB:          01-Jan-1956    Ms. Vlachos is a 55 year old white female with a history of  gastroesophageal reflux disease.  She is a smoker, and she is  overweight.  She has been reasonably well-controlled on Protonix 40 mg  twice a day, but needs a refill for her prescriptions.  Last upper  endoscopy in January 2005 showed a small hiatal hernia with mild  duodenitis.  Perhaps, this showed no evidence of Barrett's esophagus.  She is here today, because of diffuse abdominal pain, mostly crampy,  resulting in urgent bowel movements.  It happens postprandially or  sometimes just during the day.  She usually has relief of the abdominal  pain with bowel movement.  She denies any rectal bleeding.   MEDICATIONS:  1. Toprol XL 100 mg  2. Zetia one p.o. q. day.  3. Crestor 10 mg p.o. q. day.  4. Aspirin.  5. Xanax.  6. Protonix 40 mg p.o. b.i.d.   PHYSICAL EXAMINATION:  VITAL SIGNS:  Blood pressure 158/88, pulse 84 and  weight 189 pounds.  The patient was alert, oriented, no distress.  HEENT:  Sclerae is nonicteric.  NECK:  Supple without lymphadenopathy.  LUNGS:  Clear to auscultation.  COR:  Normal S1, normal S2.  ABDOMEN:  Protuberant, soft, with minimal tenderness in left lower  quadrant, otherwise normal abdominal exam with __________  costal  margin.  RECTAL:  Normal rectal tone.  Small amount of hemoccult-positive stool  in the rectal ampulla.  The patient denied having any rectal pain or  irritation, but she had a bowel movement prior to the office visit.   IMPRESSION:  1. A 55 year old white female with crampy abdominal pain, heme-      positive stool, symptoms suggestive of irritable bowel syndrome.  2. Gastroesophageal reflux disease, controlled on the high-dose  proton      pump inhibitor, as patient continues to smoke.   PLAN:  1. A colonoscopy scheduled.  The patient will be 55 years old within      the next 6 months.  2. Refill for Protonix 40 mg b.i.d.  3. Bentyl 10 mg p.o. t.i.d. p.r.n. crampy abdominal pain.     Hedwig Morton. Juanda Chance, MD  Electronically Signed    DMB/MedQ  DD: 12/31/2006  DT: 12/31/2006  Job #: [id number]

## 2011-03-01 ENCOUNTER — Other Ambulatory Visit: Payer: Self-pay | Admitting: Cardiology

## 2011-03-24 ENCOUNTER — Emergency Department (HOSPITAL_COMMUNITY)
Admission: EM | Admit: 2011-03-24 | Discharge: 2011-03-24 | Disposition: A | Discharge status: Home / Self Care | Payer: BC Managed Care – PPO | Attending: Emergency Medicine | Admitting: Emergency Medicine

## 2011-03-24 ENCOUNTER — Encounter: Payer: Self-pay | Admitting: *Deleted

## 2011-03-24 DIAGNOSIS — K0510 Chronic gingivitis, plaque induced: Secondary | ICD-10-CM

## 2011-03-24 DIAGNOSIS — R220 Localized swelling, mass and lump, head: Secondary | ICD-10-CM | POA: Insufficient documentation

## 2011-03-24 DIAGNOSIS — R221 Localized swelling, mass and lump, neck: Secondary | ICD-10-CM | POA: Insufficient documentation

## 2011-03-24 DIAGNOSIS — K089 Disorder of teeth and supporting structures, unspecified: Secondary | ICD-10-CM | POA: Insufficient documentation

## 2011-03-24 DIAGNOSIS — K047 Periapical abscess without sinus: Secondary | ICD-10-CM | POA: Insufficient documentation

## 2011-03-24 DIAGNOSIS — K029 Dental caries, unspecified: Secondary | ICD-10-CM

## 2011-03-24 DIAGNOSIS — Z7982 Long term (current) use of aspirin: Secondary | ICD-10-CM | POA: Insufficient documentation

## 2011-03-24 DIAGNOSIS — E7800 Pure hypercholesterolemia, unspecified: Secondary | ICD-10-CM | POA: Insufficient documentation

## 2011-03-24 DIAGNOSIS — I10 Essential (primary) hypertension: Secondary | ICD-10-CM | POA: Insufficient documentation

## 2011-03-24 DIAGNOSIS — Z79899 Other long term (current) drug therapy: Secondary | ICD-10-CM | POA: Insufficient documentation

## 2011-03-24 DIAGNOSIS — E119 Type 2 diabetes mellitus without complications: Secondary | ICD-10-CM | POA: Insufficient documentation

## 2011-03-24 HISTORY — DX: Essential (primary) hypertension: I10

## 2011-03-24 MED ORDER — ACETAMINOPHEN-CODEINE 120-12 MG/5ML PO SUSP: 5.0000 mL | Freq: Four times a day (QID) | ORAL | Status: AC | PRN

## 2011-03-24 MED ORDER — PENICILLIN V POTASSIUM 500 MG PO TABS: 500.0000 mg | ORAL_TABLET | Freq: Four times a day (QID) | ORAL | Status: AC

## 2011-03-24 NOTE — ED Provider Notes (Signed)
History    this is a 55 year old female with significant dental decay, presenting to the ED with chief complaints of dental pain.  Patient states she has waxing and waning dental pain for the past several weeks, worsening the past 2 days. She has noticed increase swelling to upper gum line.  Pain worsened with cold water, chewing, or air.  She denies fever, sore throat, neck pain, ear pain, chest pain, shortness of breath. She denies rash, or recent trauma. She has tried over-the-counter ibuprofen without relief. .  CSN: [id number]  Arrival date & time 03/24/11  1535   First MD Initiated Contact with Patient 03/24/11 1659      Chief Complaint  Patient presents with  . Oral Swelling    (Consider location/radiation/quality/duration/timing/severity/associated sxs/prior treatment) HPI  Past Medical History  Diagnosis Date  . Diabetes mellitus   . Hypertension   . Hypercholesteremia     History reviewed. No pertinent past surgical history.  No family history on file.  History  Substance Use Topics  . Smoking status: Current Everyday Smoker -- 1.0 packs/day    Types: Cigarettes  . Smokeless tobacco: Not on file  . Alcohol Use: No    OB History    Grav Para Term Preterm Abortions TAB SAB Ect Mult Living                  Review of Systems  All other systems reviewed and are negative.    Allergies  Review of patient's allergies indicates no known allergies.  Home Medications   Current Outpatient Rx  Name Route Sig Dispense Refill  . ASPIRIN EC 81 MG PO TBEC Oral Take 81 mg by mouth daily.      Marland Kitchen CLONAZEPAM 1 MG PO TABS Oral Take 1 mg by mouth 2 (two) times daily.      Marland Kitchen DILTIAZEM HCL ER BEADS 120 MG PO CP24 Oral Take 240 mg by mouth daily.      Marland Kitchen LAMOTRIGINE 25 MG PO TABS Oral Take 25 mg by mouth 2 (two) times daily.      Marland Kitchen LISINOPRIL 20 MG PO TABS Oral Take 20 mg by mouth daily.      Marland Kitchen METOPROLOL SUCCINATE ER 100 MG PO TB24 Oral Take 100 mg by mouth daily.      Marland Kitchen  NIACIN (ANTIHYPERLIPIDEMIC) 500 MG PO TBCR Oral Take 500 mg by mouth at bedtime.      Marland Kitchen ZOLPIDEM TARTRATE 10 MG PO TABS Oral Take 5 mg by mouth at bedtime as needed. For insomnia/anxiety       BP 143/93  Pulse 75  Temp(Src) 99.3 F (37.4 C) (Oral)  Resp 18  SpO2 98%  Physical Exam  Constitutional: She appears well-developed and well-nourished. No distress.  HENT:  Head: Normocephalic and atraumatic.  Mouth/Throat:    Eyes: Conjunctivae are normal.  Neck: Normal range of motion. Neck supple.  Cardiovascular: Normal rate and regular rhythm.   Pulmonary/Chest: Effort normal.  Lymphadenopathy:    She has no cervical adenopathy.    ED Course  Procedures (including critical care time)  Labs Reviewed - No data to display No results found.   No diagnosis found.    MDM  Significant dental decay with evidence of gingivitis and likely periapical pulpitis. She is afebrile and her vital signs stable. She'll be getting antibiotic, and pain medication, along with referral for a dentist for further management.        Fayrene Helper, PA 03/24/11 1726

## 2011-03-24 NOTE — ED Notes (Signed)
Patient with poor oral hygiene and is not having upper gum/mouth pain.  Patient teeth are very badly managed and has cavities on all of them.  Upper mouth is swollen

## 2011-03-24 NOTE — ED Provider Notes (Signed)
Medical screening examination/treatment/procedure(s) were performed by non-physician practitioner and as supervising physician I was immediately available for consultation/collaboration.   Dayton Bailiff, MD 03/24/11 [phone]

## 2011-05-07 ENCOUNTER — Encounter (HOSPITAL_COMMUNITY): Payer: Self-pay

## 2011-05-07 ENCOUNTER — Emergency Department (HOSPITAL_COMMUNITY): Payer: BC Managed Care – PPO

## 2011-05-07 ENCOUNTER — Emergency Department (HOSPITAL_COMMUNITY)
Admission: EM | Admit: 2011-05-07 | Discharge: 2011-05-07 | Disposition: A | Discharge status: Home / Self Care | Payer: BC Managed Care – PPO | Attending: Emergency Medicine | Admitting: Emergency Medicine

## 2011-05-07 DIAGNOSIS — I10 Essential (primary) hypertension: Secondary | ICD-10-CM | POA: Insufficient documentation

## 2011-05-07 DIAGNOSIS — M1710 Unilateral primary osteoarthritis, unspecified knee: Secondary | ICD-10-CM

## 2011-05-07 DIAGNOSIS — IMO0002 Reserved for concepts with insufficient information to code with codable children: Secondary | ICD-10-CM | POA: Insufficient documentation

## 2011-05-07 DIAGNOSIS — M7989 Other specified soft tissue disorders: Secondary | ICD-10-CM | POA: Insufficient documentation

## 2011-05-07 DIAGNOSIS — M179 Osteoarthritis of knee, unspecified: Secondary | ICD-10-CM

## 2011-05-07 DIAGNOSIS — M25562 Pain in left knee: Secondary | ICD-10-CM

## 2011-05-07 DIAGNOSIS — M25569 Pain in unspecified knee: Secondary | ICD-10-CM | POA: Insufficient documentation

## 2011-05-07 DIAGNOSIS — F17200 Nicotine dependence, unspecified, uncomplicated: Secondary | ICD-10-CM | POA: Insufficient documentation

## 2011-05-07 DIAGNOSIS — E119 Type 2 diabetes mellitus without complications: Secondary | ICD-10-CM | POA: Insufficient documentation

## 2011-05-07 DIAGNOSIS — E7800 Pure hypercholesterolemia, unspecified: Secondary | ICD-10-CM | POA: Insufficient documentation

## 2011-05-07 MED ORDER — OXYCODONE-ACETAMINOPHEN 5-325 MG PO TABS
2.0000 | ORAL_TABLET | Freq: Once | ORAL | Status: AC
  Administered 2011-05-07: 2 via ORAL
  Filled 2011-05-07: qty 2

## 2011-05-07 MED ORDER — HYDROCODONE-ACETAMINOPHEN 5-325 MG PO TABS: 1.0000 | ORAL_TABLET | ORAL | Status: AC | PRN

## 2011-05-07 NOTE — ED Notes (Signed)
Pt placed on continuous pulse oximetry and blood pressure cuff 

## 2011-05-07 NOTE — ED Notes (Signed)
Complains of pain in left leg since last night, okay if bent but hurts if tries to straighten out her leg.

## 2011-05-07 NOTE — ED Provider Notes (Signed)
History     CSN: [id number]  Arrival date & time 05/07/11  1100   First MD Initiated Contact with Patient 05/07/11 1225      Chief Complaint  Patient presents with  . Leg Pain    (Consider location/radiation/quality/duration/timing/severity/associated sxs/prior treatment) Patient is a 56 y.o. female presenting with knee pain. The history is provided by the patient.  Knee Pain This is a new problem. The current episode started in the past 7 days. The problem occurs constantly. The problem has been gradually worsening. Associated symptoms include arthralgias and joint swelling. Pertinent negatives include no anorexia, fever, headaches, myalgias, nausea, numbness or weakness. The symptoms are aggravated by walking and standing. She has tried rest for the symptoms. The treatment provided mild relief.   Pt states she's had worsening L leg/knee pain over the past several days. This worsens with walking or wt bearing. It additionally is worse with full extension of the leg. She has noted some mild swelling to the knee. Denies any recent injury or change in activity. She denies having sensation of the knee clicking, popping, catching, or "giving out."  Pt has hx of knee osteoarthritis and is followed by Dr. [name]. She has received injections of cortisone to the knee in the past and states she's had to have the knee drained before. She was told that she may need jt replacement in the future.  Past Medical History  Diagnosis Date  . Diabetes mellitus   . Hypertension   . Hypercholesteremia     History reviewed. No pertinent past surgical history.  History reviewed. No pertinent family history.  History  Substance Use Topics  . Smoking status: Current Everyday Smoker -- 1.0 packs/day    Types: Cigarettes  . Smokeless tobacco: Not on file  . Alcohol Use: No    OB History    Grav Para Term Preterm Abortions TAB SAB Ect Mult Living                  Review of Systems  Constitutional:  Negative for fever.  Gastrointestinal: Negative for nausea and anorexia.  Musculoskeletal: Positive for joint swelling and arthralgias. Negative for myalgias.  Skin: Negative.   Neurological: Negative for weakness, numbness and headaches.    Allergies  Review of patient's allergies indicates no known allergies.  Home Medications   Current Outpatient Rx  Name Route Sig Dispense Refill  . ASPIRIN EC 81 MG PO TBEC Oral Take 81 mg by mouth daily.      Marland Kitchen CLONAZEPAM 1 MG PO TABS Oral Take 1 mg by mouth 2 (two) times daily.      Marland Kitchen DILTIAZEM HCL ER BEADS 120 MG PO CP24 Oral Take 240 mg by mouth daily.      Marland Kitchen LAMOTRIGINE 25 MG PO TABS Oral Take 25 mg by mouth 2 (two) times daily.      Marland Kitchen LISINOPRIL 20 MG PO TABS Oral Take 20 mg by mouth daily.      Marland Kitchen METFORMIN HCL 500 MG PO TABS Oral Take 250 mg by mouth daily.    Marland Kitchen METOPROLOL SUCCINATE ER 100 MG PO TB24 Oral Take 100 mg by mouth daily.      Marland Kitchen NIACIN ER (ANTIHYPERLIPIDEMIC) 500 MG PO TBCR Oral Take 500 mg by mouth at bedtime.      Marland Kitchen ZOLPIDEM TARTRATE 10 MG PO TABS Oral Take 5 mg by mouth at bedtime as needed. For insomnia/anxiety       BP 162/92  Pulse 76  Temp(Src) 97.8 F (36.6 C) (Oral)  Resp 18  SpO2 97%  Physical Exam  Nursing note and vitals reviewed. Constitutional: She is oriented to person, place, and time. She appears well-developed and well-nourished. No distress.  HENT:  Head: Normocephalic and atraumatic.  Cardiovascular: Normal rate.   Pulmonary/Chest: Effort normal.  Musculoskeletal:       Left knee: She exhibits decreased range of motion and effusion. She exhibits no ecchymosis, no deformity, no erythema and no bony tenderness. tenderness found.       Left upper leg: Normal.       Left lower leg: Normal. She exhibits no tenderness and no edema.       L knee: Exam limited due to pain. Pt able to fully extend at knee without catching although this elicits pain. Able to flex to about 90 deg. No erythema of the jt noted.  Tiny jt effusion medially. Patella not ballottable. No bony tenderness. Drawer exams benign. Unable to perform McMurray's 2/2 pain.  No calf pain, erythema, tenderness.  Neurological: She is alert and oriented to person, place, and time.  Skin: Skin is warm and dry. She is not diaphoretic.  Psychiatric: She has a normal mood and affect.    ED Course  Procedures (including critical care time)  Labs Reviewed - No data to display Dg Knee Complete 4 Views Left  05/07/2011  *RADIOLOGY REPORT*  Clinical Data: Left knee pain.  LEFT KNEE - COMPLETE 4+ VIEW  Comparison: 01/28/2009.  Findings: Progressive tricompartmental degenerative changes with joint space narrowing and osteophytic spurring.  Subchondral cystic change and possible small osteochondral abnormality involving the lateral femoral condyle.  A small joint effusion is noted.  No acute fracture.  IMPRESSION:  1.  Progressive tricompartmental degenerative changes. 2.  Probable small osteochondral lesion involving the lateral femoral condyle. 3.  Joint effusion.  Original Report Authenticated By: P. Loralie Champagne, M.D.  I personally reviewed the plain films.   1. OA (osteoarthritis) of knee   2. Pain in left knee       MDM  Pt with hx of knee OA presents with pain to L knee which has worsened over past several days. No known injury. On exam, she appears to have tiny effusion to medial knee without ballottable patella. Plain films reviewed which indicate sm jt effusion and OA. I suspect this is a flare of her OA causing her pain - lower suspicion for meniscal pathology although this is possible. Do not have clinical suspicion for any acutely worrisome etiology based on exam (DVT, septic joint). Doubt gout. She is followed by Dr. [name]. Encouraged her to call and make f/u appt for further eval, mgmt. Return precautions discussed.        Grant Fontana, [place] 05/07/11 2036  Grant Fontana, [place] 05/07/11 2039

## 2011-05-07 NOTE — ED Notes (Signed)
Introduced self to pt; gave pt 2 pillows

## 2011-05-07 NOTE — Progress Notes (Signed)
Orthopedic Tech Progress Note Patient Details:  Krista Carter [birth year] [id number]  Other Ortho Devices Type of Ortho Device: Knee Immobilizer Ortho Device Location: left knee Ortho Device Interventions: Application   Gaye Pollack 05/07/2011, 2:03 PM

## 2011-05-09 NOTE — ED Provider Notes (Signed)
Medical screening examination/treatment/procedure(s) were performed by non-physician practitioner and as supervising physician I was immediately available for consultation/collaboration.  Wakisha Alberts, MD 05/09/11 2031 

## 2011-05-11 ENCOUNTER — Other Ambulatory Visit: Payer: Self-pay | Admitting: Orthopedic Surgery

## 2011-05-11 DIAGNOSIS — M25562 Pain in left knee: Secondary | ICD-10-CM

## 2011-05-16 ENCOUNTER — Ambulatory Visit
Admission: RE | Admit: 2011-05-16 | Discharge: 2011-05-16 | Disposition: A | Discharge status: Home / Self Care | Payer: BC Managed Care – PPO | Source: Ambulatory Visit | Attending: Orthopedic Surgery | Admitting: Orthopedic Surgery

## 2011-05-16 DIAGNOSIS — M25562 Pain in left knee: Secondary | ICD-10-CM

## 2011-05-17 ENCOUNTER — Other Ambulatory Visit: Payer: BC Managed Care – PPO

## 2011-06-20 IMAGING — CR DG SHOULDER 2+V*R*
3 series · 3 of 3 positions shown · non-contrast
Comparison: None

CLINICAL DATA: Fall, right arm pain.

RIGHT SHOULDER - 2+ VIEW

[w shoulder ap internal righ]
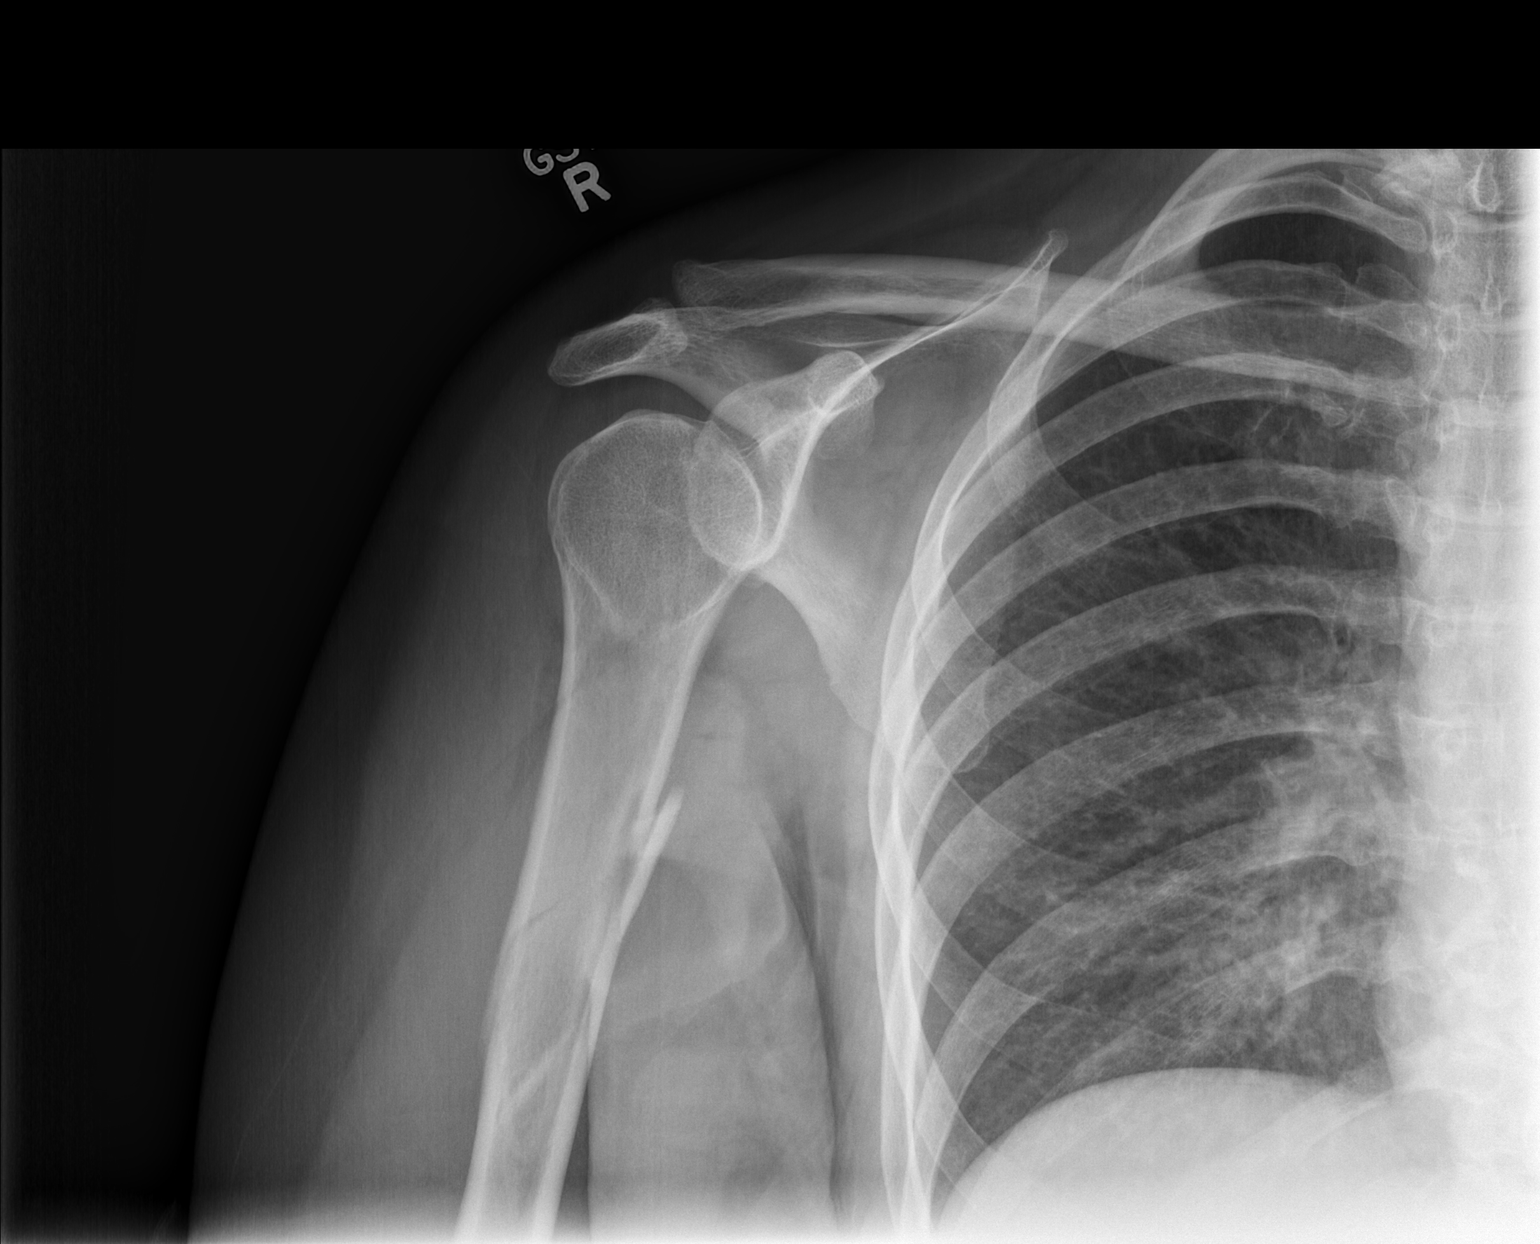

[w shoulder ap external righ]
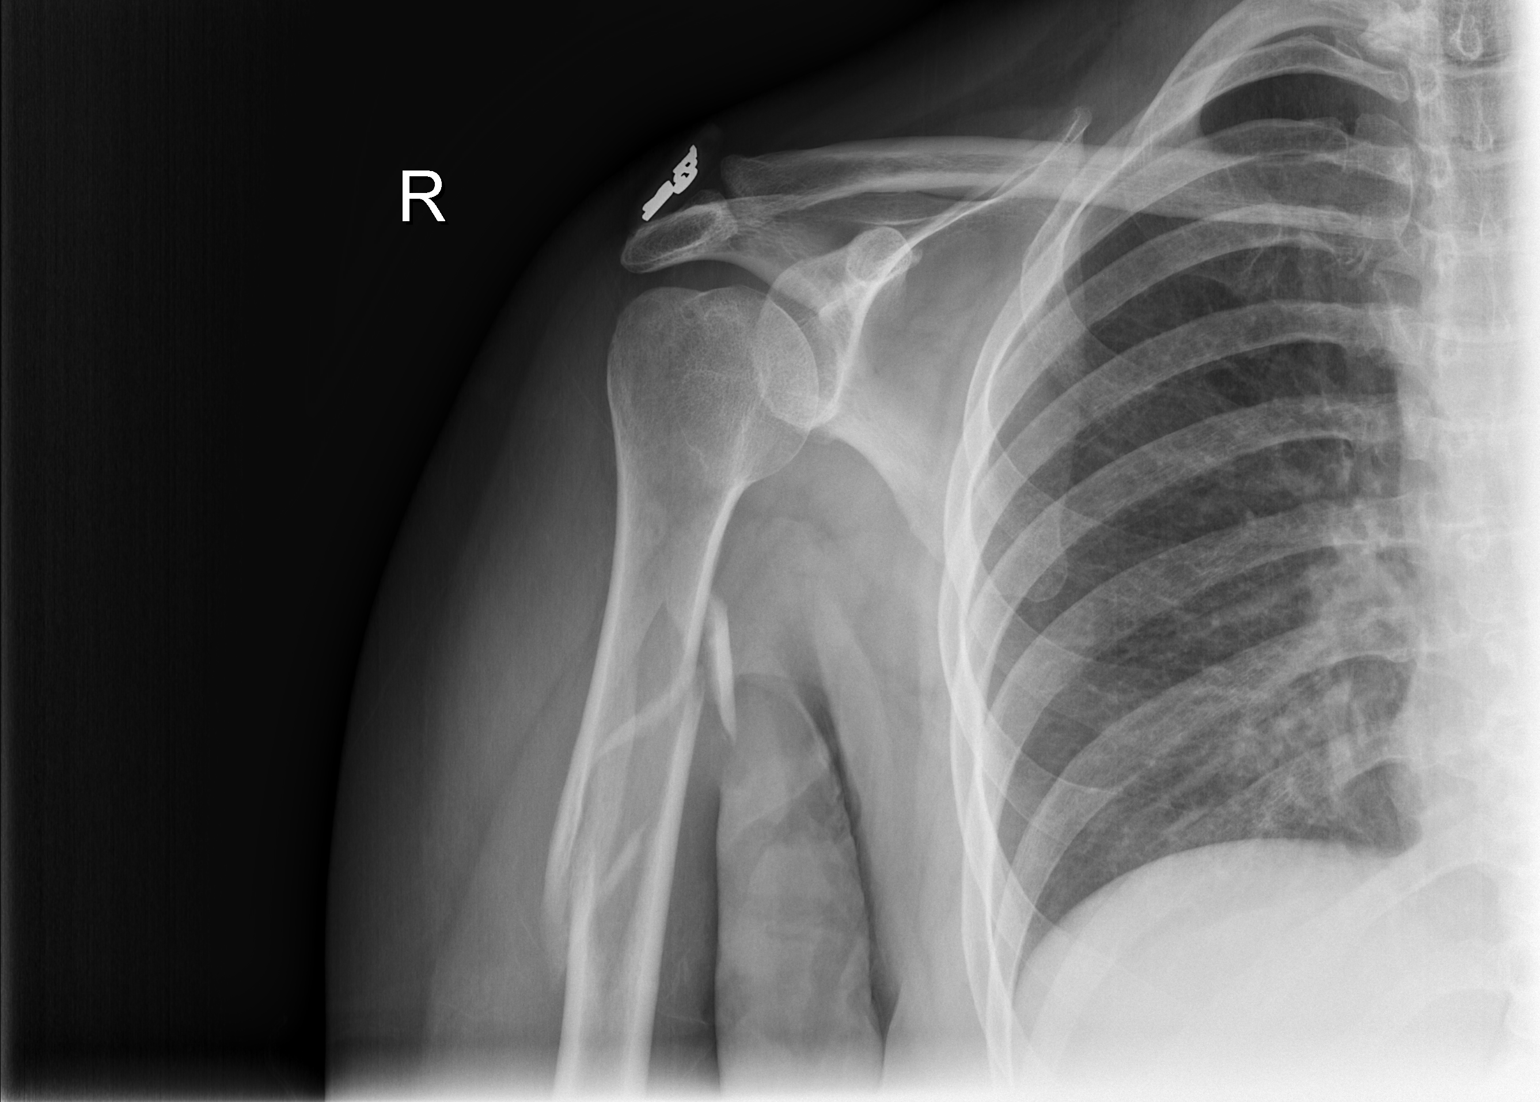

[w shoulder y view right]
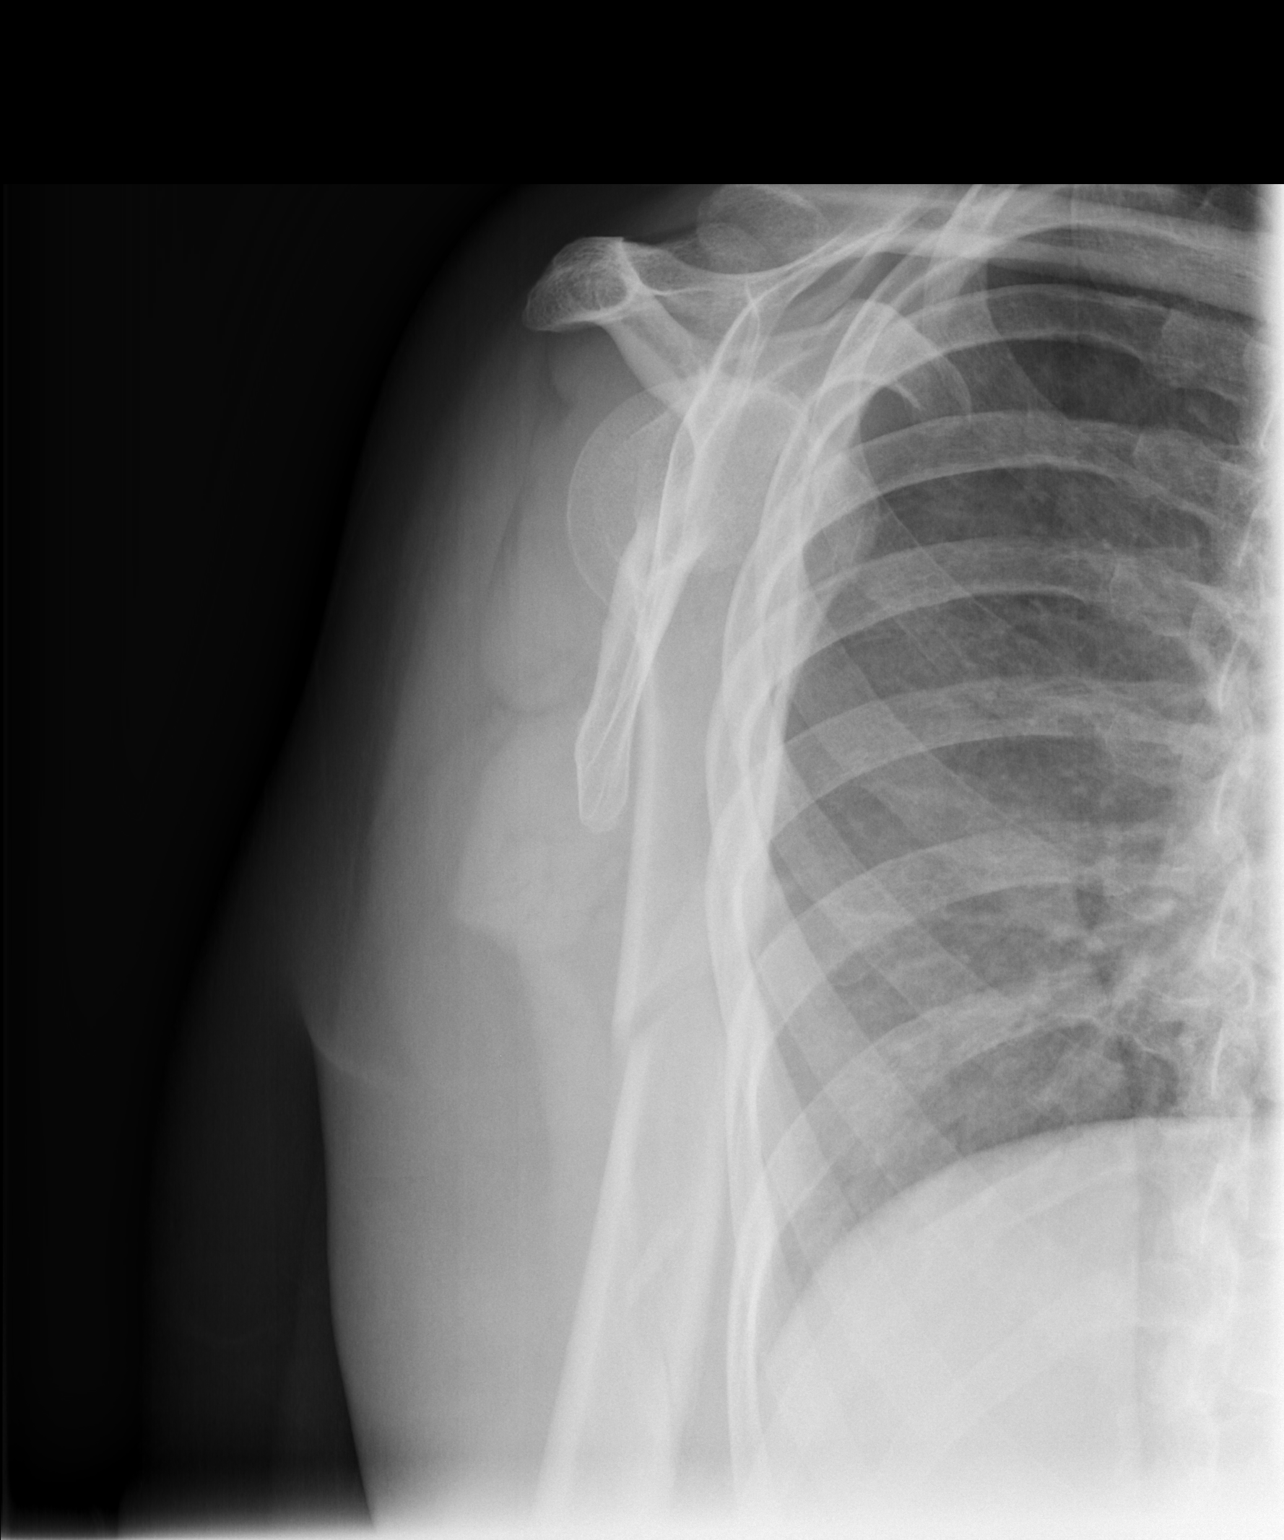

[3 of 3 positions shown; findings below may reference images not displayed]

FINDINGS: Mildly comminuted and displaced right humeral midshaft
fracture noted.  Glenohumeral joint and AC joint appear intact.  No
visualized rib abnormality.
IMPRESSION: Mildly comminuted right humeral shaft fracture.

## 2011-06-20 IMAGING — CR DG HUMERUS 2V *R*
2 series · 2 of 2 positions shown · non-contrast
Comparison: None

CLINICAL DATA: Fall, arm pain.

RIGHT HUMERUS - 2+ VIEW

[w shoulder ap internal righ]
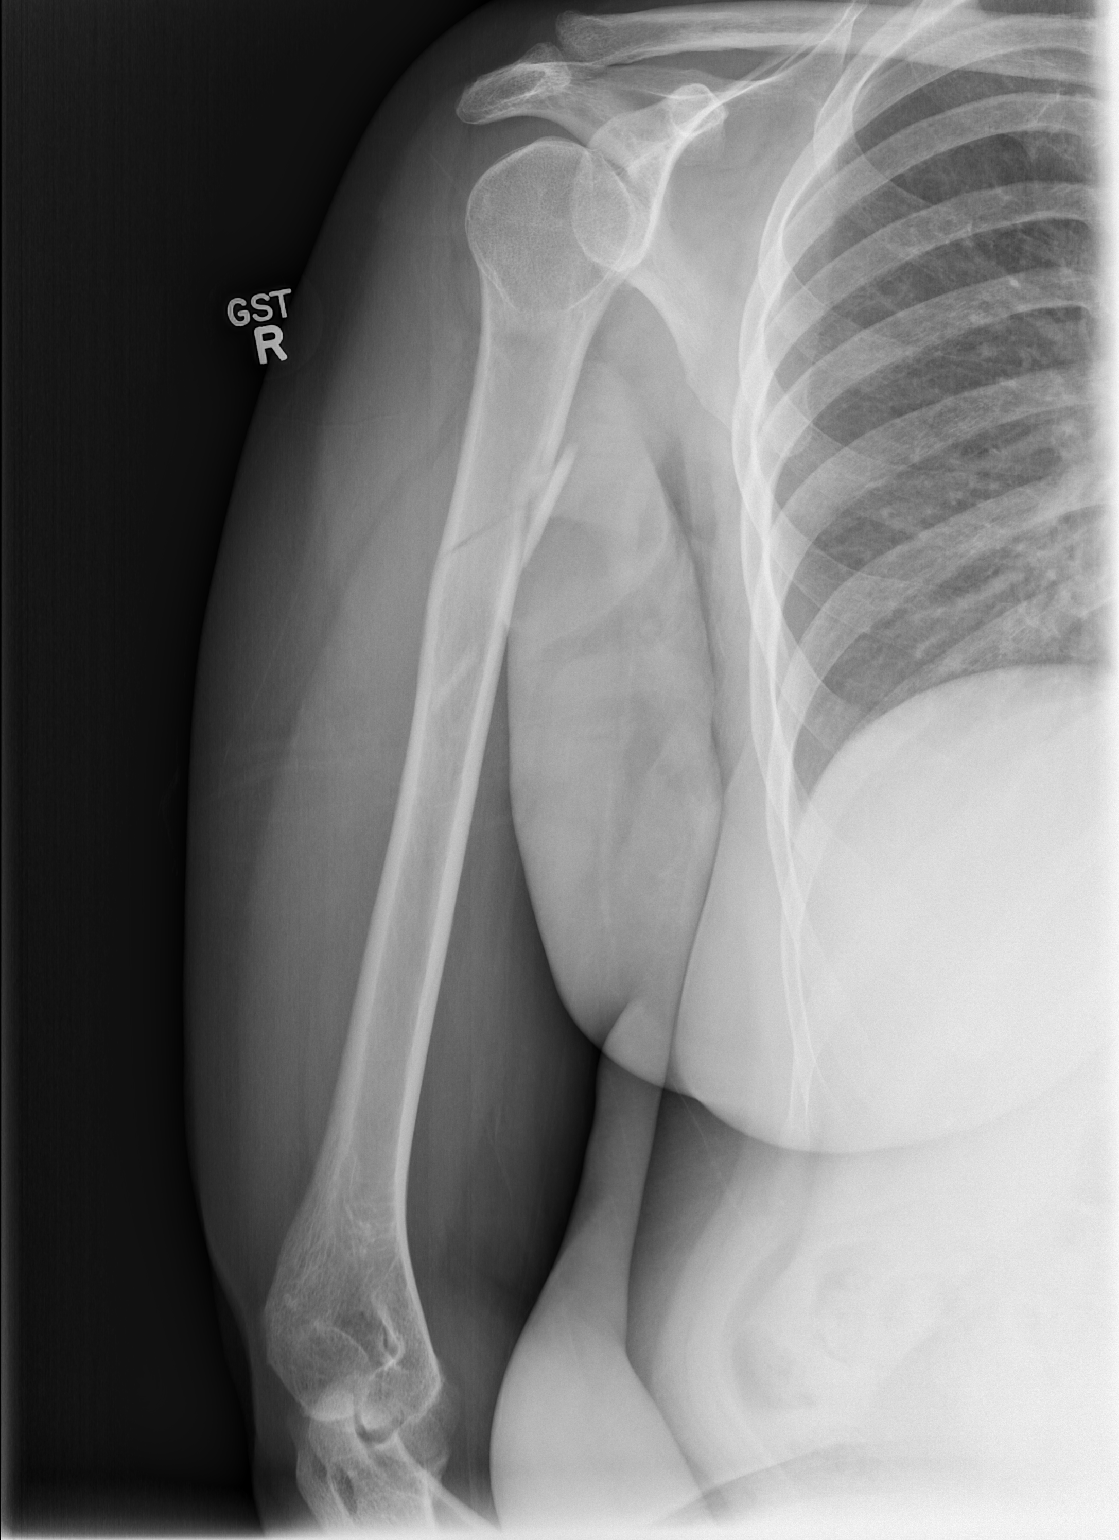

[w humerus ap right *]
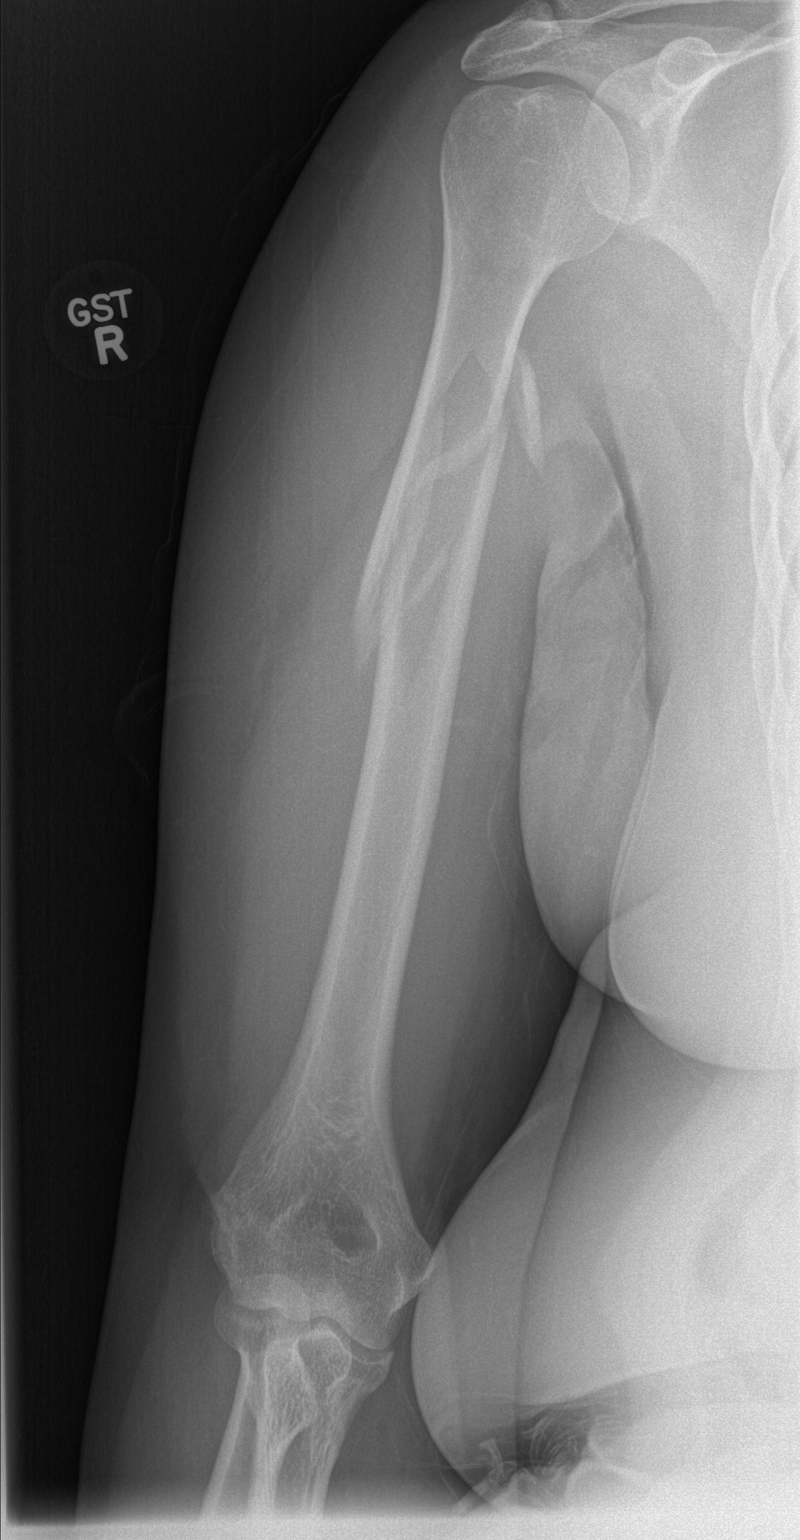

[2 of 2 positions shown; findings below may reference images not displayed]

FINDINGS: There is a mildly comminuted and displaced fracture
through the right humeral shaft.  The shoulder joint and elbow are
grossly unremarkable.  Soft tissues are intact.
IMPRESSION: Comminuted, mildly displaced right humeral shaft fracture.

## 2011-06-27 ENCOUNTER — Other Ambulatory Visit: Payer: Self-pay | Admitting: Cardiology

## 2011-08-02 ENCOUNTER — Other Ambulatory Visit: Payer: Self-pay | Admitting: Cardiology

## 2012-04-02 DIAGNOSIS — N289 Disorder of kidney and ureter, unspecified: Secondary | ICD-10-CM

## 2012-04-02 HISTORY — DX: Disorder of kidney and ureter, unspecified: N28.9

## 2012-10-31 DIAGNOSIS — E43 Unspecified severe protein-calorie malnutrition: Secondary | ICD-10-CM

## 2012-10-31 HISTORY — DX: Unspecified severe protein-calorie malnutrition: E43

## 2012-11-25 ENCOUNTER — Encounter (HOSPITAL_COMMUNITY): Payer: Self-pay | Admitting: Emergency Medicine

## 2012-11-25 ENCOUNTER — Inpatient Hospital Stay (HOSPITAL_COMMUNITY)
Admission: EM | Admit: 2012-11-25 | Discharge: 2012-11-27 | DRG: 568 | Disposition: A | Discharge status: Home / Self Care | Payer: BC Managed Care – PPO | Attending: Internal Medicine | Admitting: Internal Medicine

## 2012-11-25 DIAGNOSIS — K589 Irritable bowel syndrome without diarrhea: Secondary | ICD-10-CM

## 2012-11-25 DIAGNOSIS — R197 Diarrhea, unspecified: Secondary | ICD-10-CM

## 2012-11-25 DIAGNOSIS — E860 Dehydration: Secondary | ICD-10-CM

## 2012-11-25 DIAGNOSIS — K449 Diaphragmatic hernia without obstruction or gangrene: Secondary | ICD-10-CM

## 2012-11-25 DIAGNOSIS — I959 Hypotension, unspecified: Secondary | ICD-10-CM | POA: Diagnosis present

## 2012-11-25 DIAGNOSIS — E7800 Pure hypercholesterolemia, unspecified: Secondary | ICD-10-CM | POA: Diagnosis present

## 2012-11-25 DIAGNOSIS — E785 Hyperlipidemia, unspecified: Secondary | ICD-10-CM

## 2012-11-25 DIAGNOSIS — Z79899 Other long term (current) drug therapy: Secondary | ICD-10-CM

## 2012-11-25 DIAGNOSIS — R112 Nausea with vomiting, unspecified: Secondary | ICD-10-CM

## 2012-11-25 DIAGNOSIS — N289 Disorder of kidney and ureter, unspecified: Secondary | ICD-10-CM

## 2012-11-25 DIAGNOSIS — R0681 Apnea, not elsewhere classified: Secondary | ICD-10-CM

## 2012-11-25 DIAGNOSIS — I10 Essential (primary) hypertension: Secondary | ICD-10-CM | POA: Diagnosis present

## 2012-11-25 DIAGNOSIS — N179 Acute kidney failure, unspecified: Principal | ICD-10-CM | POA: Diagnosis present

## 2012-11-25 DIAGNOSIS — N200 Calculus of kidney: Secondary | ICD-10-CM

## 2012-11-25 DIAGNOSIS — E43 Unspecified severe protein-calorie malnutrition: Secondary | ICD-10-CM | POA: Diagnosis present

## 2012-11-25 DIAGNOSIS — Z9189 Other specified personal risk factors, not elsewhere classified: Secondary | ICD-10-CM

## 2012-11-25 DIAGNOSIS — E119 Type 2 diabetes mellitus without complications: Secondary | ICD-10-CM | POA: Diagnosis present

## 2012-11-25 DIAGNOSIS — F17200 Nicotine dependence, unspecified, uncomplicated: Secondary | ICD-10-CM | POA: Diagnosis present

## 2012-11-25 DIAGNOSIS — E876 Hypokalemia: Secondary | ICD-10-CM | POA: Diagnosis present

## 2012-11-25 DIAGNOSIS — K2980 Duodenitis without bleeding: Secondary | ICD-10-CM

## 2012-11-25 DIAGNOSIS — K219 Gastro-esophageal reflux disease without esophagitis: Secondary | ICD-10-CM

## 2012-11-25 DIAGNOSIS — Z6827 Body mass index (BMI) 27.0-27.9, adult: Secondary | ICD-10-CM

## 2012-11-25 DIAGNOSIS — R0609 Other forms of dyspnea: Secondary | ICD-10-CM

## 2012-11-25 LAB — URINALYSIS, ROUTINE W REFLEX MICROSCOPIC
Glucose, UA: NEGATIVE mg/dL
Ketones, ur: NEGATIVE mg/dL
Leukocytes, UA: NEGATIVE
Nitrite: NEGATIVE
Protein, ur: NEGATIVE mg/dL

## 2012-11-25 LAB — CBC WITH DIFFERENTIAL/PLATELET
Hemoglobin: 15.1 g/dL — ABNORMAL HIGH (ref 12.0–15.0)
Lymphocytes Relative: 35 % (ref 12–46)
Lymphs Abs: 4 10*3/uL (ref 0.7–4.0)
Monocytes Relative: 6 % (ref 3–12)
Neutro Abs: 6.6 10*3/uL (ref 1.7–7.7)
Neutrophils Relative %: 58 % (ref 43–77)
RBC: 4.61 MIL/uL (ref 3.87–5.11)
WBC: 11.5 10*3/uL — ABNORMAL HIGH (ref 4.0–10.5)

## 2012-11-25 LAB — COMPREHENSIVE METABOLIC PANEL
ALT: 10 U/L (ref 0–35)
Alkaline Phosphatase: 86 U/L (ref 39–117)
BUN: 28 mg/dL — ABNORMAL HIGH (ref 6–23)
Chloride: 99 mEq/L (ref 96–112)
GFR calc Af Amer: 35 mL/min — ABNORMAL LOW (ref >90)
Glucose, Bld: 119 mg/dL — ABNORMAL HIGH (ref 70–99)
Potassium: 2.5 mEq/L — CL (ref 3.5–5.1)
Sodium: 139 mEq/L (ref 135–145)
Total Bilirubin: 0.2 mg/dL — ABNORMAL LOW (ref 0.3–1.2)

## 2012-11-25 LAB — GLUCOSE, CAPILLARY: Glucose-Capillary: 80 mg/dL (ref 70–99)

## 2012-11-25 MED ORDER — NIACIN ER 500 MG PO CPCR
500.0000 mg | ORAL_CAPSULE | Freq: Every day | ORAL | Status: DC
  Administered 2012-11-26 (×2): 500 mg via ORAL
  Filled 2012-11-25 (×3): qty 1

## 2012-11-25 MED ORDER — LAMOTRIGINE 25 MG PO TABS
25.0000 mg | ORAL_TABLET | Freq: Two times a day (BID) | ORAL | Status: DC
  Administered 2012-11-26 – 2012-11-27 (×4): 25 mg via ORAL
  Filled 2012-11-25 (×5): qty 1

## 2012-11-25 MED ORDER — ZOLPIDEM TARTRATE 5 MG PO TABS: 5.0000 mg | ORAL_TABLET | Freq: Every evening | ORAL | Status: DC | PRN

## 2012-11-25 MED ORDER — NIACIN ER (ANTIHYPERLIPIDEMIC) 500 MG PO TBCR
500.0000 mg | EXTENDED_RELEASE_TABLET | Freq: Every day | ORAL | Status: DC
  Filled 2012-11-25: qty 1

## 2012-11-25 MED ORDER — INSULIN ASPART 100 UNIT/ML ~~LOC~~ SOLN: 0.0000 [IU] | Freq: Three times a day (TID) | SUBCUTANEOUS | Status: DC

## 2012-11-25 MED ORDER — POTASSIUM CHLORIDE CRYS ER 20 MEQ PO TBCR
20.0000 meq | EXTENDED_RELEASE_TABLET | Freq: Every day | ORAL | Status: DC
  Administered 2012-11-26 – 2012-11-27 (×2): 20 meq via ORAL
  Filled 2012-11-25 (×2): qty 1

## 2012-11-25 MED ORDER — ASPIRIN EC 81 MG PO TBEC
81.0000 mg | DELAYED_RELEASE_TABLET | Freq: Every day | ORAL | Status: DC
  Administered 2012-11-26 – 2012-11-27 (×2): 81 mg via ORAL
  Filled 2012-11-25 (×2): qty 1

## 2012-11-25 MED ORDER — SODIUM CHLORIDE 0.9 % IV BOLUS (SEPSIS)
1000.0000 mL | Freq: Once | INTRAVENOUS | Status: AC
  Administered 2012-11-25: 1000 mL via INTRAVENOUS

## 2012-11-25 MED ORDER — ONDANSETRON HCL 4 MG/2ML IJ SOLN: 4.0000 mg | Freq: Four times a day (QID) | INTRAMUSCULAR | Status: DC | PRN

## 2012-11-25 MED ORDER — ACETAMINOPHEN 325 MG PO TABS
650.0000 mg | ORAL_TABLET | Freq: Four times a day (QID) | ORAL | Status: DC | PRN
  Administered 2012-11-26: 650 mg via ORAL
  Administered 2012-11-26: 325 mg via ORAL
  Filled 2012-11-25 (×2): qty 2

## 2012-11-25 MED ORDER — POTASSIUM CHLORIDE 10 MEQ/100ML IV SOLN
10.0000 meq | INTRAVENOUS | Status: AC
  Administered 2012-11-25 (×3): 10 meq via INTRAVENOUS
  Filled 2012-11-25 (×3): qty 100

## 2012-11-25 MED ORDER — POTASSIUM CHLORIDE CRYS ER 20 MEQ PO TBCR
40.0000 meq | EXTENDED_RELEASE_TABLET | Freq: Once | ORAL | Status: AC
  Administered 2012-11-25: 40 meq via ORAL
  Filled 2012-11-25: qty 2

## 2012-11-25 MED ORDER — SODIUM CHLORIDE 0.9 % IJ SOLN
3.0000 mL | Freq: Two times a day (BID) | INTRAMUSCULAR | Status: DC
  Administered 2012-11-26 (×2): 3 mL via INTRAVENOUS

## 2012-11-25 MED ORDER — ONDANSETRON HCL 4 MG/2ML IJ SOLN: 4.0000 mg | Freq: Three times a day (TID) | INTRAMUSCULAR | Status: AC | PRN

## 2012-11-25 MED ORDER — POTASSIUM CHLORIDE IN NACL 40-0.9 MEQ/L-% IV SOLN
INTRAVENOUS | Status: AC
  Administered 2012-11-25 – 2012-11-26 (×2): via INTRAVENOUS
  Filled 2012-11-25 (×3): qty 1000

## 2012-11-25 MED ORDER — ONDANSETRON HCL 4 MG PO TABS: 4.0000 mg | ORAL_TABLET | Freq: Four times a day (QID) | ORAL | Status: DC | PRN

## 2012-11-25 MED ORDER — ACETAMINOPHEN 650 MG RE SUPP: 650.0000 mg | Freq: Four times a day (QID) | RECTAL | Status: DC | PRN

## 2012-11-25 MED ORDER — ENOXAPARIN SODIUM 40 MG/0.4ML ~~LOC~~ SOLN
40.0000 mg | Freq: Every day | SUBCUTANEOUS | Status: DC
  Administered 2012-11-26: 40 mg via SUBCUTANEOUS
  Filled 2012-11-25 (×3): qty 0.4

## 2012-11-25 MED ORDER — CLONAZEPAM 1 MG PO TABS
1.0000 mg | ORAL_TABLET | Freq: Two times a day (BID) | ORAL | Status: DC
  Administered 2012-11-26 – 2012-11-27 (×4): 1 mg via ORAL
  Filled 2012-11-25 (×6): qty 1

## 2012-11-25 NOTE — H&P (Signed)
[facility] History and Physical  Krista Carter ZOX:[id number] DOB: Aug 17, 1955 DOA: 11/25/2012  Referring physician: ER physician. PCP: Benita Stabile, MD   Chief Complaint: Diarrhea.  HPI: Krista Carter is a 57 y.o. female with history of hypertension and prediabetes presents with complaints of persistent diarrhea with nausea and vomiting. Patient has been having these symptoms for last one month. Last month patient also had used penicillin orally for dental work. Patient states that her diarrhea was present even before this. She occasionally gets crampy abdominal discomfort. Denies any fever chills. Denies any sick contacts or recent travel. In the ER patient was found to be mildly hypotensive with creatinine increased from baseline. Patient has been admitted for further management. Patient denies any chest pain or shortness of breath. Presently patient is not in acute distress.  Review of Systems: As presented in the history of presenting illness, rest negative.  Past Medical History  Diagnosis Date  . Diabetes mellitus   . Hypertension   . Hypercholesteremia    Past Surgical History  Procedure Laterality Date  . Abdominal hysterectomy     Social History:  reports that she has been smoking Cigarettes.  She has been smoking about 1.00 pack per day. She does not have any smokeless tobacco history on file. She reports that she does not drink alcohol or use illicit drugs. Home. where does patient live-- Can do ADLs Can patient participate in ADLs?  No Known Allergies  Family History  Problem Relation Age of Onset  . Stroke Mother   . Kidney cancer Mother       Prior to Admission medications   Medication Sig Start Date End Date Taking? Authorizing Provider  aspirin EC 81 MG tablet Take 81 mg by mouth daily.     Yes Historical Provider, MD  clonazePAM (KLONOPIN) 1 MG tablet Take 1 mg by mouth 2 (two) times daily.     Yes Historical Provider, MD  diltiazem (TIAZAC) 120  MG 24 hr capsule Take 240 mg by mouth daily.     Yes Historical Provider, MD  lamoTRIgine (LAMICTAL) 25 MG tablet Take 25 mg by mouth 2 (two) times daily.     Yes Historical Provider, MD  lisinopril (PRINIVIL,ZESTRIL) 20 MG tablet Take 20 mg by mouth daily.     Yes Historical Provider, MD  metFORMIN (GLUCOPHAGE) 500 MG tablet Take 250 mg by mouth daily.   Yes Historical Provider, MD  metoprolol (TOPROL-XL) 100 MG 24 hr tablet Take 100 mg by mouth daily.     Yes Historical Provider, MD  niacin (NIASPAN) 500 MG CR tablet Take 500 mg by mouth at bedtime.     Yes Historical Provider, MD  potassium chloride SA (K-DUR,KLOR-CON) 20 MEQ tablet Take 20 mEq by mouth daily.   Yes Historical Provider, MD  zolpidem (AMBIEN) 10 MG tablet Take 5 mg by mouth at bedtime as needed. For insomnia/anxiety    Yes Historical Provider, MD   Physical Exam: Filed Vitals:   11/25/12 1601 11/25/12 1826  BP: 102/57 93/65  Pulse: 85 76  Temp: 98.5 F (36.9 C)   TempSrc: Oral   Resp: 18   SpO2: 100% 100%     General:  Well-developed and nourished.  Eyes: Anicteric no pallor.  ENT: No discharge from the ears eyes nose mouth.  Neck: No mass felt.  Cardiovascular: S1-S2 heard.  Respiratory: No rhonchi or crepitations.  Abdomen: Soft nontender bowel sounds present.  Skin: No rash.  Musculoskeletal: No edema.  Psychiatric:  Appears normal.  Neurologic: Alert awake oriented to time place and person. Moves all extremities.  Labs on Admission:  Basic Metabolic Panel:  Recent Labs Lab 11/25/12 1610  NA 139  K 2.5*  CL 99  CO2 24  GLUCOSE 119*  BUN 28*  CREATININE 1.81*  CALCIUM 9.4   Liver Function Tests:  Recent Labs Lab 11/25/12 1610  AST 12  ALT 10  ALKPHOS 86  BILITOT 0.2*  PROT 7.4  ALBUMIN 3.8   No results found for this basename: LIPASE, AMYLASE,  in the last 168 hours No results found for this basename: AMMONIA,  in the last 168 hours CBC:  Recent Labs Lab 11/25/12 1610   WBC 11.5*  NEUTROABS 6.6  HGB 15.1*  HCT 42.3  MCV 91.8  PLT 237   Cardiac Enzymes: No results found for this basename: CKTOTAL, CKMB, CKMBINDEX, TROPONINI,  in the last 168 hours  BNP (last 3 results) No results found for this basename: PROBNP,  in the last 8760 hours CBG: No results found for this basename: GLUCAP,  in the last 168 hours  Radiological Exams on Admission: No results found.   Assessment/Plan Principal Problem:   Nausea vomiting and diarrhea Active Problems:   HYPERTENSION   Hypokalemia   Acute renal failure   1. Diarrhea with nausea and vomiting - cause is not clear. Stool studies have been ordered including C. difficile. Abdomen appears benign on exam. Continue with hydration for now. 2. Acute renal failure - probably from dehydration secondary to diarrhea and poor oral intake. Patient also was mildly hypotensive which could have further contributed to the renal failure. Patient is receiving 2 L normal saline bolus. For now we'll hold antihypertensives. Closely follow intake output and metabolic panel. 3. Hypokalemia - probably from diarrhea. Replace and recheck. 4. Hypotension - probably from dehydration. Hold antihypertensives. Aggressive fluid hydration. 5. Prediabetes - hold metformin for now. Closely follow CBGs.    Code Status: Full code.  Family Communication: None.  Disposition Plan: Admit to inpatient.    Chanda Laperle N. [facility] Pager [phone].  If 7PM-7AM, please contact night-coverage [email or web address] Password [facility] 11/25/2012, 8:45 PM

## 2012-11-25 NOTE — ED Notes (Signed)
Pt c/o dizziness, gen weakness, and diarrhea x several days; pt sts dizziness upon standing only

## 2012-11-25 NOTE — ED Provider Notes (Signed)
CSN: [id number]     Arrival date & time 11/25/12  1556 History   First MD Initiated Contact with Patient 11/25/12 1836     Chief Complaint  Patient presents with  . Dizziness  . Weakness  . Diarrhea   (Consider location/radiation/quality/duration/timing/severity/associated sxs/prior Treatment) HPI Comments: Patient with history of diabetes, hypertension presents with complaint of watery diarrhea for the past one month. She has had approximately 5 episodes of watery diarrhea per day, every day. She has seen her primary care physician for this who prescribed potassium and 'a blue pill' for stomach cramps. Over the past 2 days she has noted an increase in vomiting. No blood in her stools. No fever or urinary symptoms. Today she feels lightheaded with standing. She describes tunnel vision but no full syncope. The onset of this condition was gradual. The course is gradually worsening. Aggravating factors: standing. Alleviating factors: none.    The history is provided by the patient.    Past Medical History  Diagnosis Date  . Diabetes mellitus   . Hypertension   . Hypercholesteremia    History reviewed. No pertinent past surgical history. History reviewed. No pertinent family history. History  Substance Use Topics  . Smoking status: Current Every Day Smoker -- 1.00 packs/day    Types: Cigarettes  . Smokeless tobacco: Not on file  . Alcohol Use: No   OB History   Grav Para Term Preterm Abortions TAB SAB Ect Mult Living                 Review of Systems  Constitutional: Negative for fever and chills.  HENT: Negative for sore throat and rhinorrhea.   Eyes: Negative for redness.  Respiratory: Negative for cough.   Cardiovascular: Negative for chest pain.  Gastrointestinal: Positive for nausea, vomiting and diarrhea. Negative for abdominal pain.  Genitourinary: Negative for dysuria.  Musculoskeletal: Negative for myalgias.  Skin: Negative for rash.  Neurological: Negative for  headaches.    Allergies  Review of patient's allergies indicates no known allergies.  Home Medications   Current Outpatient Rx  Name  Route  Sig  Dispense  Refill  . aspirin EC 81 MG tablet   Oral   Take 81 mg by mouth daily.           . clonazePAM (KLONOPIN) 1 MG tablet   Oral   Take 1 mg by mouth 2 (two) times daily.           Marland Kitchen diltiazem (TIAZAC) 120 MG 24 hr capsule   Oral   Take 240 mg by mouth daily.           Marland Kitchen lamoTRIgine (LAMICTAL) 25 MG tablet   Oral   Take 25 mg by mouth 2 (two) times daily.           Marland Kitchen lisinopril (PRINIVIL,ZESTRIL) 20 MG tablet   Oral   Take 20 mg by mouth daily.           . metFORMIN (GLUCOPHAGE) 500 MG tablet   Oral   Take 250 mg by mouth daily.         . metoprolol (TOPROL-XL) 100 MG 24 hr tablet   Oral   Take 100 mg by mouth daily.           . niacin (NIASPAN) 500 MG CR tablet   Oral   Take 500 mg by mouth at bedtime.           . potassium chloride SA (K-DUR,KLOR-CON) 20 MEQ  tablet   Oral   Take 20 mEq by mouth daily.         Marland Kitchen zolpidem (AMBIEN) 10 MG tablet   Oral   Take 5 mg by mouth at bedtime as needed. For insomnia/anxiety           BP 93/65  Pulse 76  Temp(Src) 98.5 F (36.9 C) (Oral)  Resp 18  SpO2 100% Physical Exam  Nursing note and vitals reviewed. Constitutional: She appears well-developed and well-nourished.  HENT:  Head: Normocephalic and atraumatic.  Mucous membranes dry.  Eyes: Conjunctivae are normal. Right eye exhibits no discharge. Left eye exhibits no discharge.  Neck: Normal range of motion. Neck supple.  Cardiovascular: Normal rate, regular rhythm and normal heart sounds.   Pulmonary/Chest: Effort normal and breath sounds normal.  Abdominal: Soft. There is no tenderness. There is no rebound and no guarding.  Neurological: She is alert.  Skin: Skin is warm and dry.  Psychiatric: She has a normal mood and affect.    ED Course  Procedures (including critical care  time) Labs Review Labs Reviewed  CBC WITH DIFFERENTIAL - Abnormal; Notable for the following:    WBC 11.5 (*)    Hemoglobin 15.1 (*)    All other components within normal limits  COMPREHENSIVE METABOLIC PANEL - Abnormal; Notable for the following:    Potassium 2.5 (*)    Glucose, Bld 119 (*)    BUN 28 (*)    Creatinine, Ser 1.81 (*)    Total Bilirubin 0.2 (*)    GFR calc non Af Amer 30 (*)    GFR calc Af Amer 35 (*)    All other components within normal limits  URINALYSIS, ROUTINE W REFLEX MICROSCOPIC - Abnormal; Notable for the following:    Color, Urine AMBER (*)    APPearance CLOUDY (*)    Bilirubin Urine SMALL (*)    All other components within normal limits  STOOL CULTURE  GI PATHOGEN PANEL BY PCR, STOOL   Imaging Review No results found.  Patient seen and examined. Work-up initiated. 2L NS ordered.   Vital signs reviewed and are as follows: Filed Vitals:   11/25/12 1826  BP: 93/65  Pulse: 76  Temp:   Resp:    D/w Dr. Deretha Emory.   Patient informed of results. She is agreeable to admission.    MDM   1. Diarrhea   2. Hypokalemia   3. Dehydration   4. Renal insufficiency    Admit for hypotension, dehydration and electrolyte imbalance in setting of persistent diarrhea.     Renne Crigler, PA-C 11/25/12 2025

## 2012-11-26 ENCOUNTER — Encounter (HOSPITAL_COMMUNITY): Payer: Self-pay | Admitting: Radiology

## 2012-11-26 ENCOUNTER — Inpatient Hospital Stay (HOSPITAL_COMMUNITY): Payer: BC Managed Care – PPO

## 2012-11-26 DIAGNOSIS — E43 Unspecified severe protein-calorie malnutrition: Secondary | ICD-10-CM | POA: Insufficient documentation

## 2012-11-26 LAB — BASIC METABOLIC PANEL
GFR calc Af Amer: 55 mL/min — ABNORMAL LOW (ref >90)
GFR calc non Af Amer: 48 mL/min — ABNORMAL LOW (ref >90)
Potassium: 3.5 mEq/L (ref 3.5–5.1)
Sodium: 143 mEq/L (ref 135–145)

## 2012-11-26 LAB — CBC
Hemoglobin: 11.9 g/dL — ABNORMAL LOW (ref 12.0–15.0)
RBC: 3.67 MIL/uL — ABNORMAL LOW (ref 3.87–5.11)

## 2012-11-26 LAB — GLUCOSE, CAPILLARY
Glucose-Capillary: 113 mg/dL — ABNORMAL HIGH (ref 70–99)
Glucose-Capillary: 81 mg/dL (ref 70–99)
Glucose-Capillary: 94 mg/dL (ref 70–99)

## 2012-11-26 LAB — HEMOGLOBIN A1C: Mean Plasma Glucose: 120 mg/dL — ABNORMAL HIGH (ref <117)

## 2012-11-26 MED ORDER — BOOST / RESOURCE BREEZE PO LIQD
1.0000 | Freq: Three times a day (TID) | ORAL | Status: DC
  Administered 2012-11-26 – 2012-11-27 (×2): 1 via ORAL

## 2012-11-26 MED ORDER — IOHEXOL 300 MG/ML  SOLN
100.0000 mL | Freq: Once | INTRAMUSCULAR | Status: AC | PRN
  Administered 2012-11-26: 100 mL via INTRAVENOUS

## 2012-11-26 MED ORDER — ACETYLCYSTEINE 20 % IN SOLN
600.0000 mg | Freq: Two times a day (BID) | RESPIRATORY_TRACT | Status: DC
  Filled 2012-11-26 (×3): qty 4

## 2012-11-26 MED ORDER — IOHEXOL 300 MG/ML  SOLN
25.0000 mL | INTRAMUSCULAR | Status: AC
  Administered 2012-11-26 (×2): 25 mL via ORAL

## 2012-11-26 MED ORDER — TRAMADOL HCL 50 MG PO TABS
50.0000 mg | ORAL_TABLET | Freq: Four times a day (QID) | ORAL | Status: DC | PRN
  Administered 2012-11-26: 50 mg via ORAL
  Filled 2012-11-26: qty 1

## 2012-11-26 NOTE — ED Provider Notes (Signed)
Medical screening examination/treatment/procedure(s) were performed by non-physician practitioner and as supervising physician I was immediately available for consultation/collaboration.   Shelda Jakes, MD 11/26/12 [phone]

## 2012-11-26 NOTE — Progress Notes (Signed)
INITIAL NUTRITION ASSESSMENT  DOCUMENTATION CODES Per approved criteria  -Severe malnutrition in the context of acute illness or injury   INTERVENTION: 1. Resource Breeze po BID, each supplement provides 250 kcal and 9 grams of protein.   NUTRITION DIAGNOSIS: Inadequate oral intake related to inability to eat as evidenced by N/V/D with solid foods.   Goal: PO intake to meet >/=90% estimated nutrition needs with good tolerance.   Monitor:  PO intake, diet advance, supplement tolerance   Reason for Assessment: Malnutrition Screening tool  57 y.o. female  Admitting Dx: Nausea vomiting and diarrhea  ASSESSMENT: Pt admitted with diarrhea, nausea and vomiting for about 1 month. Recently on abx, but reports diarrhea started prior to abx.   Pt states that for about the past month she has not been able to eat any solid foods. Was able to drink some soda and other beverages. Usual weight is about 180 lbs, thinks she has lost about 20 lbs in the past month.   Pt meets criteria for severe malnutrition in the context of acute illness 2/2 meeting </=50% estimated nutrition needs for >/=5 days and weight loss of 10% body weight in the past month.    Height: Ht Readings from Last 1 Encounters:  11/25/12 5\' 4"  (1.626 m)    Weight: Wt Readings from Last 1 Encounters:  11/25/12 162 lb 2 oz (73.539 kg)    Ideal Body Weight: 120 lbs   % Ideal Body Weight: 135%  Wt Readings from Last 10 Encounters:  11/25/12 162 lb 2 oz (73.539 kg)  02/17/10 188 lb (85.276 kg)    Usual Body Weight: 180 lbs   % Usual Body Weight: 90%  BMI:  Body mass index is 27.81 kg/(m^2). Overweight   Estimated Nutritional Needs: Kcal: 1600-1800 Protein: 70-80 gm  Fluid: 1.61.8 L   Skin: intact   Diet Order: Clear Liquid  EDUCATION NEEDS: -No education needs identified at this time   Intake/Output Summary (Last 24 hours) at 11/26/12 1122 Last data filed at 11/26/12 0900  Gross per 24 hour  Intake  1311.25 ml  Output    200 ml  Net 1111.25 ml    Last BM: 8/27   Labs:   Recent Labs Lab 11/25/12 1610 11/26/12 0558  NA 139 143  K 2.5* 3.5  CL 99 112  CO2 24 22  BUN 28* 22  CREATININE 1.81* 1.25*  CALCIUM 9.4 8.2*  GLUCOSE 119* 93    CBG (last 3)   Recent Labs  11/25/12 2133 11/26/12 0831  GLUCAP 80 89    Scheduled Meds: . aspirin EC  81 mg Oral Daily  . clonazePAM  1 mg Oral BID  . enoxaparin (LOVENOX) injection  40 mg Subcutaneous Daily  . insulin aspart  0-9 Units Subcutaneous TID WC  . lamoTRIgine  25 mg Oral BID  . niacin  500 mg Oral QHS  . potassium chloride SA  20 mEq Oral Daily  . sodium chloride  3 mL Intravenous Q12H    Continuous Infusions: . 0.9 % NaCl with KCl 40 mEq / L 100 mL/hr (11/26/12 0809)    Past Medical History  Diagnosis Date  . Diabetes mellitus   . Hypertension   . Hypercholesteremia     Past Surgical History  Procedure Laterality Date  . Abdominal hysterectomy      Clarene Duke RD, LDN Pager [phone] After Hours pager [phone]

## 2012-11-26 NOTE — Progress Notes (Signed)
[facility]                                                                                Patient Demographics  Krista Carter, is a 57 y.o. female, DOB - [birth year], XLK:[id number], DGU:[id number]  Admit date - 11/25/2012  Admitting Physician Eduard Clos, MD  Outpatient Primary MD for the patient is Benita Stabile, MD  LOS - 1   Chief Complaint  Patient presents with  . Dizziness  . Weakness  . Diarrhea        Assessment & Plan    Diarrhea with nausea and vomiting - cause is not clear. Stool studies have been ordered including C. difficile. Abdomen appears benign on exam. Continue with hydration for now. Will order CT scan abdomen pelvis with oral contrast as diarrhea has been there for one month, hold Glucophage as could contemplate to diarrhea.      Acute renal failure with dehydration and hypo-tension from diarrhea - probably from dehydration secondary to diarrhea and poor oral intake. Patient also was mildly hypotensive which could have further contributed to the renal failure. Failure resolved after IV fluids, continue gentle hydration. For now we'll hold antihypertensives. Closely follow metabolic panel.      Hypokalemia - probably from diarrhea. Replaced and able.      Prediabetes - hold metformin for now. Check A1c, Closely follow CBGs.  Lab Results  Component Value Date   HGBA1C  Value: 5.9 (NOTE)                                                                       According to the ADA Clinical Practice Recommendations for 2011, when HbA1c is used as a screening test:   >=6.5%   Diagnostic of Diabetes Mellitus           (if abnormal result  is confirmed)  5.7-6.4%   Increased risk of developing Diabetes Mellitus  References:Diagnosis and Classification of Diabetes Mellitus,Diabetes Care,2011,34(Suppl 1):S62-S69 and Standards of Medical Care in         Diabetes - 2011,Diabetes Care,2011,34  (Suppl 1):S11-S61.* 07/11/2010    CBG (last 3)    Recent Labs  11/25/12 2133 11/26/12 0831  GLUCAP 80 89       Code Status: Full  Family Communication: Husband bedside  Disposition Plan: Home   Procedures CT scan abdomen pelvis with oral contrast   Consults      DVT Prophylaxis  Lovenox   Lab Results  Component Value Date   PLT 168 11/26/2012    Medications  Scheduled Meds: . aspirin EC  81 mg Oral Daily  . clonazePAM  1 mg Oral BID  . enoxaparin (LOVENOX) injection  40 mg Subcutaneous Daily  . insulin aspart  0-9 Units Subcutaneous TID WC  . lamoTRIgine  25 mg Oral BID  . niacin  500 mg Oral QHS  . potassium chloride SA  20 mEq Oral Daily  .  sodium chloride  3 mL Intravenous Q12H   Continuous Infusions: . 0.9 % NaCl with KCl 40 mEq / L 100 mL/hr (11/26/12 0809)   PRN Meds:.acetaminophen, acetaminophen, ondansetron (ZOFRAN) IV, ondansetron, zolpidem  Antibiotics     Anti-infectives   None       Time Spent in minutes   35   SINGH,PRASHANT K M.D on 11/26/2012 at 10:54 AM  Between 7am to 7pm - Pager - [phone]  After 7pm go to [email or web address] - password [email or web address]  And look for the night coverage person covering for me after hours  [facility] Office  [phone]    Subjective:   Krista Carter today has, No headache, No chest pain, No abdominal pain - No Nausea, No new weakness tingling or numbness, No Cough - SOB. Improving diarrhea  Objective:   Filed Vitals:   11/26/12 0640 11/26/12 0642 11/26/12 0644 11/26/12 0900  BP: 94/60 98/55 87/56  98/61  Pulse: 70 70 73 72  Temp: 98.4 F (36.9 C)   99.1 F (37.3 C)  TempSrc: Oral   Oral  Resp: 18 18 18 18   Height:      Weight:      SpO2: 98% 100% 100% 99%    Wt Readings from Last 3 Encounters:  11/25/12 73.539 kg (162 lb 2 oz)  02/17/10 85.276 kg (188 lb)     Intake/Output Summary (Last 24 hours) at 11/26/12 1054 Last data filed at 11/26/12 0900  Gross per 24 hour  Intake 1311.25 ml  Output    200 ml  Net 1111.25 ml     Exam Awake Alert, Oriented X 3, No new F.N deficits, Normal affect [place].AT,PERRAL Supple Neck,No JVD, No cervical lymphadenopathy appriciated.  Symmetrical Chest wall movement, Good air movement bilaterally, CTAB RRR,No Gallops,Rubs or new Murmurs, No Parasternal Heave +ve B.Sounds, Abd Soft, Non tender, No organomegaly appriciated, No rebound - guarding or rigidity. No Cyanosis, Clubbing or edema, No new Rash or bruise     Data Review   Micro Results No results found for this or any previous visit (from the past 240 hour(s)).  Radiology Reports No results found.  CBC  Recent Labs Lab 11/25/12 1610 11/26/12 0558  WBC 11.5* 7.4  HGB 15.1* 11.9*  HCT 42.3 34.1*  PLT 237 168  MCV 91.8 92.9  MCH 32.8 32.4  MCHC 35.7 34.9  RDW 14.2 14.4  LYMPHSABS 4.0  --   MONOABS 0.7  --   EOSABS 0.1  --   BASOSABS 0.1  --     Chemistries   Recent Labs Lab 11/25/12 1610 11/26/12 0558  NA 139 143  K 2.5* 3.5  CL 99 112  CO2 24 22  GLUCOSE 119* 93  BUN 28* 22  CREATININE 1.81* 1.25*  CALCIUM 9.4 8.2*  AST 12  --   ALT 10  --   ALKPHOS 86  --   BILITOT 0.2*  --    ------------------------------------------------------------------------------------------------------------------ estimated creatinine clearance is 49.9 ml/min (by C-G formula based on Cr of 1.25). ------------------------------------------------------------------------------------------------------------------ No results found for this basename: HGBA1C,  in the last 72 hours ------------------------------------------------------------------------------------------------------------------ No results found for this basename: CHOL, HDL, LDLCALC, TRIG, CHOLHDL, LDLDIRECT,  in the last 72 hours ------------------------------------------------------------------------------------------------------------------ No results found for this basename: TSH, T4TOTAL, FREET3, T3FREE, THYROIDAB,  in the last 72  hours ------------------------------------------------------------------------------------------------------------------ No results found for this basename: VITAMINB12, FOLATE, FERRITIN, TIBC, IRON, RETICCTPCT,  in the last 72 hours  Coagulation profile No results found for this basename: INR,  PROTIME,  in the last 168 hours  No results found for this basename: DDIMER,  in the last 72 hours  Cardiac Enzymes No results found for this basename: CK, CKMB, TROPONINI, MYOGLOBIN,  in the last 168 hours ------------------------------------------------------------------------------------------------------------------ No components found with this basename: POCBNP,

## 2012-11-26 NOTE — Progress Notes (Signed)
Utilization review completed.  

## 2012-11-27 LAB — BASIC METABOLIC PANEL
CO2: 24 mEq/L (ref 19–32)
Chloride: 108 mEq/L (ref 96–112)
GFR calc Af Amer: 87 mL/min — ABNORMAL LOW (ref >90)
Potassium: 4.2 mEq/L (ref 3.5–5.1)
Sodium: 140 mEq/L (ref 135–145)

## 2012-11-27 MED ORDER — CIPROFLOXACIN HCL 500 MG PO TABS: 500.0000 mg | ORAL_TABLET | Freq: Two times a day (BID) | ORAL | Status: DC

## 2012-11-27 MED ORDER — LISINOPRIL 20 MG PO TABS: 10.0000 mg | ORAL_TABLET | Freq: Every day | ORAL | Status: DC

## 2012-11-27 MED ORDER — CIPROFLOXACIN HCL 500 MG PO TABS
500.0000 mg | ORAL_TABLET | Freq: Two times a day (BID) | ORAL | Status: DC
  Administered 2012-11-27: 500 mg via ORAL
  Filled 2012-11-27 (×3): qty 1

## 2012-11-27 MED ORDER — METRONIDAZOLE 500 MG PO TABS: 500.0000 mg | ORAL_TABLET | Freq: Three times a day (TID) | ORAL | Status: DC

## 2012-11-27 MED ORDER — METRONIDAZOLE 500 MG PO TABS
500.0000 mg | ORAL_TABLET | Freq: Three times a day (TID) | ORAL | Status: DC
  Administered 2012-11-27: 500 mg via ORAL
  Filled 2012-11-27 (×4): qty 1

## 2012-11-27 MED ORDER — HYOSCYAMINE SULFATE ER 0.375 MG PO TB12: 0.3750 mg | ORAL_TABLET | Freq: Two times a day (BID) | ORAL | Status: DC | PRN

## 2012-11-27 NOTE — Discharge Summary (Addendum)
[facility]                                                                                   Krista Carter, is a 57 y.o. female  DOB [birth year]  MRN [id number].  Admission date:  11/25/2012  Discharge Date:  11/27/2012  Primary MD  Benita Stabile, MD  Admitting Physician  Eduard Clos, MD  Admission Diagnosis  Diarrhea [787.91] Dehydration [276.51] Hypokalemia [276.8] Acute renal failure [584.9] Renal insufficiency [593.9] Nausea vomiting and diarrhea [787.91, 787.01]  Discharge Diagnosis     Principal Problem:   Nausea vomiting and diarrhea Active Problems:   HYPERTENSION   Hypokalemia   Acute renal failure   Protein-calorie malnutrition, severe      Past Medical History  Diagnosis Date  . Diabetes mellitus   . Hypertension   . Hypercholesteremia     Past Surgical History  Procedure Laterality Date  . Abdominal hysterectomy       Recommendations for primary care physician for things to follow:   Any follow final stool cultures, BMP and blood pressure closely, please insure patient follows with GI in one week, appointment has been set as below.   Discharge Diagnoses:   Principal Problem:   Nausea vomiting and diarrhea Active Problems:   HYPERTENSION   Hypokalemia   Acute renal failure   Protein-calorie malnutrition, severe    Discharge Condition: stable   Diet recommendation: See Discharge Instructions below   Consults      History of present illness and  Hospital Course:     Kindly see H&P for history of present illness and admission details, please review complete Labs, Consult reports and Test reports for all details in brief Krista Carter, is a 57 y.o. female, patient with history of borderline diabetes mellitus 2, hypertension who was admitted to the hospital with one month history of nausea vomiting and diarrhea causing dehydration prerenal azotemia along with acute renal failure and hypokalemia, she was given supportive  care with IV fluids, her ACE inhibitor was held, she underwent CT scan of the abdomen pelvis which confirmed changes as below suggestive of intermittent bowel versus infectious colitis, she was C. difficile negative, she has been placed on Cipro and Flagyl, she is already feeling better and is pain-free, she has had no nausea vomiting and her bowel movements are down to 3-4 a day, her final stool cultures are still pending will request primary care physician to kindly follow those. Patient has seen [facility] in the past and I have set up an appointment for her within a week, she probably will benefit from colonoscopy in the next few weeks.  Upon discharge I have reduced her ACE inhibitor dose, will request primary care physician to please monitor her blood pressure and BMP and adjust as needed. Her A1c this admission was 5.8 which makes her prediabetic.   Patient to be symptom-free husband his bedside, both were offered to stay one more day and see GI in inpatient setting however they chose not to do so and they chose to see GI in the office.   Addendum-post discharge patient requested that she wants to see the GI  inpatient, I called [facility], patient was seen by Arnetha Massy GI PA who agreed with the above plan and requested that patient be discharged with outpatient followup with GI within a week. Of note in the meantime stool cultures have come back except for viral pathogens which are still pending she is negative for C. difficile along with Shigella Salmonella or equal.    Today   Subjective:   Krista Carter today has no headache,no chest abdominal pain,no new weakness tingling or numbness, feels much better wants to go home today.   Objective:   Blood pressure 119/71, pulse 77, temperature 98.4 F (36.9 C), temperature source Oral, resp. rate 18, height 5\' 4"  (1.626 m), weight 73.539 kg (162 lb 2 oz), SpO2 99.00%.   Intake/Output Summary (Last 24 hours) at 11/27/12 0930 Last  data filed at 11/27/12 0919  Gross per 24 hour  Intake    750 ml  Output    250 ml  Net    500 ml    Exam Awake Alert, Oriented *3, No new F.N deficits, Normal affect [place].AT,PERRAL Supple Neck,No JVD, No cervical lymphadenopathy appriciated.  Symmetrical Chest wall movement, Good air movement bilaterally, CTAB RRR,No Gallops,Rubs or new Murmurs, No Parasternal Heave +ve B.Sounds, Abd Soft, Non tender, No organomegaly appriciated, No rebound -guarding or rigidity. No Cyanosis, Clubbing or edema, No new Rash or bruise  Data Review   Major procedures and Radiology Reports - PLEASE review detailed and final reports for all details, in brief -       Ct Abdomen Pelvis W Contrast  11/26/2012   *RADIOLOGY REPORT*  Clinical Data: Diarrhea, acute renal failure, creatinine 1.25, history hypertension, diabetes, hypercholesterolemia  CT ABDOMEN AND PELVIS WITH CONTRAST  Technique:  Multidetector CT imaging of the abdomen and pelvis was performed following the standard protocol during bolus administration of intravenous contrast. Sagittal and coronal MPR images reconstructed from axial data set.  Contrast: OMNIPAQUE IOHEXOL 300 MG/ML  SOLN Dilute oral contrast.  Comparison: None  Findings: Bibasilar atelectasis. Tiny left renal cysts. Mild fullness of right renal collecting system without of calculus or definite obstruction. Liver, spleen, pancreas and kidneys otherwise unremarkable. Thickening of bilateral adrenal glands without discrete mass.  Splenule at splenic hilum. Gallbladder collapsed. Question cecal wall thickening. Normal appendix. Additional sites of incomplete colonic distention limiting assessment of colonic wall thickness but suspicion of rectosigmoid wall thickening raised, cannot exclude colitis.  Small bowel loops unremarkable. Stomach decompressed, poorly assessed. Uterus surgically absent with normal sized ovaries and unremarkable bladder. Numerous pelvic phleboliths. No mass,  adenopathy, free fluid or free air. No acute osseous findings.  IMPRESSION: Rectosigmoid wall thickening suspicious for colitis question infection or inflammatory bowel disease. Questionable cecal wall thickening versus artifact from underdistension; consider assessment by colonoscopy or barium enema. No other definite intra-abdominal or intrapelvic abnormalities.   Original Report Authenticated By: Ulyses Southward, M.D.    Micro Results      Recent Results (from the past 240 hour(s))  CLOSTRIDIUM DIFFICILE BY PCR     Status: None   Collection Time    11/26/12  1:13 PM      Result Value Range Status   C difficile by pcr NEGATIVE  NEGATIVE Final     CBC w Diff: Lab Results  Component Value Date   WBC 7.4 11/26/2012   HGB 11.9* 11/26/2012   HCT 34.1* 11/26/2012   PLT 168 11/26/2012   LYMPHOPCT 35 11/25/2012   MONOPCT 6 11/25/2012   EOSPCT  1 11/25/2012   BASOPCT 0 11/25/2012    CMP: Lab Results  Component Value Date   NA 140 11/27/2012   K 4.2 11/27/2012   CL 108 11/27/2012   CO2 24 11/27/2012   BUN 11 11/27/2012   CREATININE 0.86 11/27/2012   PROT 7.4 11/25/2012   ALBUMIN 3.8 11/25/2012   BILITOT 0.2* 11/25/2012   ALKPHOS 86 11/25/2012   AST 12 11/25/2012   ALT 10 11/25/2012  .   Discharge Instructions     Follow with Primary MD Benita Stabile, MD in 3 days   Get CBC, CMP, checked 3 days by Primary MD and again as instructed by your Primary MD.    Get Medicines reviewed and adjusted.  Please request your Prim.MD to go over all Hospital Tests and Procedure/Radiological results at the follow up, please get all Hospital records sent to your Prim MD by signing hospital release before you go home.  Activity: As tolerated with Full fall precautions use walker/cane & assistance as needed   Diet:  Heart Healthy  For Heart failure patients - Check your Weight same time everyday, if you gain over 2 pounds, or you develop in leg swelling, experience more shortness of breath or chest pain,  call your Primary MD immediately. Follow Cardiac Low Salt Diet and 1.8 lit/day fluid restriction.  Disposition Home    If you experience worsening of your admission symptoms, develop shortness of breath, life threatening emergency, suicidal or homicidal thoughts you must seek medical attention immediately by calling 911 or calling your MD immediately  if symptoms less severe.  You Must read complete instructions/literature along with all the possible adverse reactions/side effects for all the Medicines you take and that have been prescribed to you. Take any new Medicines after you have completely understood and accpet all the possible adverse reactions/side effects.   Do not drive and provide baby sitting services if your were admitted for syncope or siezures until you have seen by Primary MD or a Neurologist and advised to do so again.  Do not drive when taking Pain medications.    Do not take more than prescribed Pain, Sleep and Anxiety Medications  Special Instructions: If you have smoked or chewed Tobacco  in the last 2 yrs please stop smoking, stop any regular Alcohol  and or any Recreational drug use.  Wear Seat belts while driving.   Please note  You were cared for by a hospitalist during your hospital stay. If you have any questions about your discharge medications or the care you received while you were in the hospital after you are discharged, you can call the unit and asked to speak with the hospitalist on call if the hospitalist that took care of you is not available. Once you are discharged, your primary care physician will handle any further medical issues. Please note that NO REFILLS for any discharge medications will be authorized once you are discharged, as it is imperative that you return to your primary care physician (or establish a relationship with a primary care physician if you do not have one) for your aftercare needs so that they can reassess your need for medications  and monitor your lab values.        Follow-up Information   Follow up with Benita Stabile, MD. Schedule an appointment as soon as possible for a visit in 3 days.   Specialty:  Family Medicine   Contact information:   [facility] [address],  [place] [address] [phone]       Follow up with Lina Sar, MD On 12/04/2012. (10 am)    Specialty:  Gastroenterology   Contact information:   [address] [place] [address] [phone]         Discharge Medications     Medication List         aspirin EC 81 MG tablet  Take 81 mg by mouth daily.     ciprofloxacin 500 MG tablet  Commonly known as:  CIPRO  Take 1 tablet (500 mg total) by mouth 2 (two) times daily.     clonazePAM 1 MG tablet  Commonly known as:  KLONOPIN  Take 1 mg by mouth 2 (two) times daily.     diltiazem 120 MG 24 hr capsule  Commonly known as:  TIAZAC  Take 240 mg by mouth daily.     hyoscyamine 0.375 MG 12 hr tablet  Commonly known as:  LEVBID  Take 1 tablet (0.375 mg total) by mouth every 12 (twelve) hours as needed for cramping.     lamoTRIgine 25 MG tablet  Commonly known as:  LAMICTAL  Take 25 mg by mouth 2 (two) times daily.     lisinopril 20 MG tablet  Commonly known as:  PRINIVIL,ZESTRIL  Take 0.5 tablets (10 mg total) by mouth daily.     metFORMIN 500 MG tablet  Commonly known as:  GLUCOPHAGE  Take 250 mg by mouth daily.     metoprolol succinate 100 MG 24 hr tablet  Commonly known as:  TOPROL-XL  Take 100 mg by mouth daily.     metroNIDAZOLE 500 MG tablet  Commonly known as:  FLAGYL  Take 1 tablet (500 mg total) by mouth every 8 (eight) hours.     niacin 500 MG CR tablet  Commonly known as:  NIASPAN  Take 500 mg by mouth at bedtime.     potassium chloride SA 20 MEQ tablet  Commonly known as:  K-DUR,KLOR-CON  Take 20 mEq by mouth daily.     zolpidem 10 MG tablet  Commonly known as:  AMBIEN  Take 5 mg by mouth  at bedtime as needed. For insomnia/anxiety           Total Time in preparing paper work, data evaluation and todays exam - 35 minutes  Leroy Sea M.D on 11/27/2012 at 9:30 AM  [facility] Office  [phone]

## 2012-11-28 LAB — GI PATHOGEN PANEL BY PCR, STOOL
Cryptosporidium by PCR: NEGATIVE
E coli (ETEC) LT/ST: NEGATIVE
E coli 0157 by PCR: NEGATIVE
Norovirus GI/GII: NEGATIVE
Salmonella by PCR: NEGATIVE
Shigella by PCR: NEGATIVE

## 2012-11-30 LAB — STOOL CULTURE

## 2012-12-03 ENCOUNTER — Encounter: Payer: Self-pay | Admitting: Internal Medicine

## 2012-12-04 ENCOUNTER — Ambulatory Visit: Payer: BC Managed Care – PPO | Admitting: Physician Assistant

## 2012-12-05 ENCOUNTER — Encounter: Payer: Self-pay | Admitting: *Deleted

## 2013-01-30 ENCOUNTER — Ambulatory Visit: Payer: BC Managed Care – PPO | Admitting: Internal Medicine

## 2013-04-24 ENCOUNTER — Other Ambulatory Visit: Payer: Self-pay | Admitting: Family Medicine

## 2013-04-24 ENCOUNTER — Ambulatory Visit
Admission: RE | Admit: 2013-04-24 | Discharge: 2013-04-24 | Disposition: A | Discharge status: Home / Self Care | Payer: BC Managed Care – PPO | Source: Ambulatory Visit | Attending: Family Medicine | Admitting: Family Medicine

## 2013-04-24 DIAGNOSIS — R059 Cough, unspecified: Secondary | ICD-10-CM

## 2013-04-24 DIAGNOSIS — R05 Cough: Secondary | ICD-10-CM

## 2013-08-10 ENCOUNTER — Emergency Department (HOSPITAL_COMMUNITY)
Admission: EM | Admit: 2013-08-10 | Discharge: 2013-08-10 | Disposition: A | Discharge status: Home / Self Care | Payer: BC Managed Care – PPO | Attending: Emergency Medicine | Admitting: Emergency Medicine

## 2013-08-10 ENCOUNTER — Encounter (HOSPITAL_COMMUNITY): Payer: Self-pay | Admitting: Emergency Medicine

## 2013-08-10 DIAGNOSIS — E119 Type 2 diabetes mellitus without complications: Secondary | ICD-10-CM | POA: Insufficient documentation

## 2013-08-10 DIAGNOSIS — R05 Cough: Secondary | ICD-10-CM | POA: Insufficient documentation

## 2013-08-10 DIAGNOSIS — R079 Chest pain, unspecified: Secondary | ICD-10-CM | POA: Insufficient documentation

## 2013-08-10 DIAGNOSIS — R059 Cough, unspecified: Secondary | ICD-10-CM | POA: Insufficient documentation

## 2013-08-10 DIAGNOSIS — I10 Essential (primary) hypertension: Secondary | ICD-10-CM | POA: Insufficient documentation

## 2013-08-10 DIAGNOSIS — R197 Diarrhea, unspecified: Secondary | ICD-10-CM | POA: Insufficient documentation

## 2013-08-10 DIAGNOSIS — R0602 Shortness of breath: Secondary | ICD-10-CM | POA: Insufficient documentation

## 2013-08-10 DIAGNOSIS — R110 Nausea: Secondary | ICD-10-CM | POA: Insufficient documentation

## 2013-08-10 DIAGNOSIS — M549 Dorsalgia, unspecified: Secondary | ICD-10-CM | POA: Insufficient documentation

## 2013-08-10 DIAGNOSIS — R1084 Generalized abdominal pain: Secondary | ICD-10-CM | POA: Insufficient documentation

## 2013-08-10 LAB — CBC
HEMATOCRIT: 42.7 % (ref 36.0–46.0)
HEMOGLOBIN: 14.3 g/dL (ref 12.0–15.0)
MCH: 31.3 pg (ref 26.0–34.0)
MCHC: 33.5 g/dL (ref 30.0–36.0)
MCV: 93.4 fL (ref 78.0–100.0)
Platelets: 239 10*3/uL (ref 150–400)
RBC: 4.57 MIL/uL (ref 3.87–5.11)
RDW: 14.4 % (ref 11.5–15.5)
WBC: 7.3 10*3/uL (ref 4.0–10.5)

## 2013-08-10 LAB — BASIC METABOLIC PANEL
BUN: 20 mg/dL (ref 6–23)
CALCIUM: 9.5 mg/dL (ref 8.4–10.5)
CO2: 24 mEq/L (ref 19–32)
Chloride: 101 mEq/L (ref 96–112)
Creatinine, Ser: 0.85 mg/dL (ref 0.50–1.10)
GFR calc non Af Amer: 76 mL/min — ABNORMAL LOW (ref >90)
GFR, EST AFRICAN AMERICAN: 88 mL/min — AB (ref >90)
GLUCOSE: 98 mg/dL (ref 70–99)
POTASSIUM: 3 meq/L — AB (ref 3.7–5.3)
Sodium: 141 mEq/L (ref 137–147)

## 2013-08-10 LAB — I-STAT TROPONIN, ED: Troponin i, poc: 0 ng/mL (ref 0.00–0.08)

## 2013-08-10 LAB — LIPASE, BLOOD: Lipase: 30 U/L (ref 11–59)

## 2013-08-10 LAB — PRO B NATRIURETIC PEPTIDE: Pro B Natriuretic peptide (BNP): 111.2 pg/mL (ref 0–125)

## 2013-08-10 NOTE — ED Notes (Signed)
Patient returned call.  She left due to wait.  She has been encouraged to return.

## 2013-08-10 NOTE — ED Notes (Signed)
Called and left a message,encouraged to call back

## 2013-08-10 NOTE — ED Notes (Signed)
Called to reassess vital signs.  No answer in lobby.

## 2013-08-10 NOTE — ED Notes (Addendum)
Pt reports non radiating 5/10 central "sharp" chest pain and generalized abdominal pain with SOB (worse with exertion), nausea, back pain, diarrhea and generalized weakness x 1 month. Pt in NAD. AO x 4.

## 2013-08-13 ENCOUNTER — Encounter: Payer: Self-pay | Admitting: Nurse Practitioner

## 2013-08-17 ENCOUNTER — Encounter: Payer: Self-pay | Admitting: Nurse Practitioner

## 2013-08-17 ENCOUNTER — Encounter: Payer: Self-pay | Admitting: Internal Medicine

## 2013-08-17 ENCOUNTER — Ambulatory Visit (INDEPENDENT_AMBULATORY_CARE_PROVIDER_SITE_OTHER): Payer: BC Managed Care – PPO | Admitting: Nurse Practitioner

## 2013-08-17 VITALS — BP 108/56 | HR 70 | Ht 64.0 in | Wt 149.0 lb

## 2013-08-17 DIAGNOSIS — R197 Diarrhea, unspecified: Secondary | ICD-10-CM

## 2013-08-17 DIAGNOSIS — R634 Abnormal weight loss: Secondary | ICD-10-CM

## 2013-08-17 DIAGNOSIS — R110 Nausea: Secondary | ICD-10-CM

## 2013-08-17 DIAGNOSIS — R1084 Generalized abdominal pain: Secondary | ICD-10-CM

## 2013-08-17 MED ORDER — MOVIPREP 100 G PO SOLR: 1.0000 | ORAL | Status: DC

## 2013-08-17 NOTE — Progress Notes (Signed)
HPI :  Patient is a 58 year old known remotely to Dr. Olevia Perches. She had a normal colonoscopy in 2008 for evaluation of heme positive stool, abdominal pain and urgent bowel movements.  Symptoms felt to be secondary to IBS. She also has a history of GERD. EGD in 2008 pertinent for a hiatal hernia and duodenitis.  Patient had a major gastrointestinal problems for several years following her 2008 workup until last August when she was hospitalized for" a colon infection". I reviewed the hospital records, stool pathogen panel was negative the CT scan of the abdomen and pelvis with contrast showed rectosigmoid wall thickening suspicious for colitis. It was questionable cecal wall thickening . We did not see the patient during that August 2014 hospitalization, she was apparently advised to followup with [place] outpatient. Her symptoms had quieted down until about 3-4 months ago when she developed a recurrent postprandial diarrhea and, diffuse abdominal pain . Abdominal pain mainly in the am, before breakfast. No recent antibiotics. She reports a 30 pound weight loss in the last 6 months. Patient was evaluated in the emergency department on  08/10/12. Except for hypokalemia, labs were unremarkable. Following ED visit patient was evaluated by her PCP who checked stool studies (still pending) and referred her here  Past Medical History  Diagnosis Date  . Diabetes mellitus   . Hypertension   . Hypercholesteremia   . Renal insufficiency   . GERD (gastroesophageal reflux disease)   . Hiatal hernia     Family History  Problem Relation Age of Onset  . Stroke Mother   . Kidney cancer Mother    History  Substance Use Topics  . Smoking status: Current Every Day Smoker -- 1.00 packs/day    Types: Cigarettes  . Smokeless tobacco: Never Used  . Alcohol Use: No   Current Outpatient Prescriptions  Medication Sig Dispense Refill  . aspirin EC 81 MG tablet Take 81 mg by mouth daily.        . clonazePAM  (KLONOPIN) 1 MG tablet Take 1 mg by mouth 2 (two) times daily.        Marland Kitchen diltiazem (TIAZAC) 120 MG 24 hr capsule Take 240 mg by mouth daily.        Marland Kitchen lisinopril (PRINIVIL,ZESTRIL) 20 MG tablet Take 0.5 tablets (10 mg total) by mouth daily.      . metFORMIN (GLUCOPHAGE) 500 MG tablet Take 250 mg by mouth daily.      . metoprolol (TOPROL-XL) 100 MG 24 hr tablet Take 100 mg by mouth daily.        . niacin (NIASPAN) 500 MG CR tablet Take 500 mg by mouth at bedtime.        . pantoprazole (PROTONIX) 40 MG tablet Take 40 mg by mouth daily.      . potassium chloride SA (K-DUR,KLOR-CON) 20 MEQ tablet Take 20 mEq by mouth daily.      . sertraline (ZOLOFT) 50 MG tablet Take 50 mg by mouth daily.      Marland Kitchen zolpidem (AMBIEN) 10 MG tablet Take 5 mg by mouth at bedtime as needed. For insomnia/anxiety       . MOVIPREP 100 G SOLR Take 1 kit (200 g total) by mouth as directed.  1 kit  0   No current facility-administered medications for this visit.   No Known Allergies   Review of Systems: Positive for anxiety, arthritis, back pain, headaches, depression and fatigue. All other systems reviewed and negative except where noted in  HPI.    Physical Exam: BP 108/56  Pulse 70  Ht 5' 4"  (1.626 m)  Wt 149 lb (67.586 kg)  BMI 25.56 kg/m2 Constitutional: Pleasant,well-developed, white female in no acute distress. HEENT: Normocephalic and atraumatic. Conjunctivae are normal. No scleral icterus. Neck supple.  Cardiovascular: Normal rate, regular rhythm.  Pulmonary/chest: Effort normal and breath sounds normal. No wheezing, rales or rhonchi. Abdominal: Soft, nondistended, nontender. Bowel sounds active throughout. There are no masses palpable. No hepatomegaly. Extremities: no edema Lymphadenopathy: No cervical adenopathy noted. Neurological: Alert and oriented to person place and time. Skin: Skin is warm and dry. No rashes noted. Psychiatric: Normal mood and affect. Behavior is normal.   ASSESSMENT AND  PLAN:  75. 58 year old female with diffuse abdominal pain, nausea, and diarrhea. Patient worked up for similar symptoms in 2008, felt to have irritable bowel syndrome. Symptoms better for several years until 2014 when hospitalized with recurrent symptoms in the setting of CT scan findings of rectosigmoiditis .Now presenting with recurrent diffuse abdominal pain, diarrhea, nausea, and significant, unexplained weight loss. For further evaluation patient will be scheduled for colonoscopy. The risks, benefits, and alternatives to colonoscopy with possible biopsy and possible polypectomy were discussed with the patient and she consents to proceed.   2. Weight loss. She has intermittent nausea. Symptoms may be related to #1 but up until recently patient had been using BC Powders on a regular basis. Need to rule out upper GI pathology. For further evaluation patient will be scheduled for EGD to be done at time of colonoscopy. The benefits, risks, and potential complications of EGD with possible biopsies  were discussed with the patient and she agrees to proceed.   3. Diabetes. She doesn't check CBGs at home.   4. GERD. Asymptomatic on daily PPI (years).

## 2013-08-17 NOTE — Patient Instructions (Signed)
You have been scheduled for an endoscopy and colonoscopy with propofol. Please follow the written instructions given to you at your visit today. Please pick up your prep at the pharmacy within the next 1-3 days. [employer]. We have given you a free coupon.  If you use inhalers (even only as needed), please bring them with you on the day of your procedure. Your physician has requested that you go to www.startemmi.com and enter the access code given to you at your visit today. This web site gives a general overview about your procedure. However, you should still follow specific instructions given to you by our office regarding your preparation for the procedure.

## 2013-08-18 ENCOUNTER — Encounter: Payer: Self-pay | Admitting: Nurse Practitioner

## 2013-08-18 NOTE — Progress Notes (Signed)
Reviewed and agree. IBS vs visceral neuropathy ( bacterial overgrowth), would empirically try Bentyl till we can do colon. And probiotics. ?? Glucophage may be a cotributing factor

## 2013-09-30 ENCOUNTER — Encounter: Payer: Self-pay | Admitting: Internal Medicine

## 2013-09-30 ENCOUNTER — Ambulatory Visit (AMBULATORY_SURGERY_CENTER): Payer: BC Managed Care – PPO | Admitting: Internal Medicine

## 2013-09-30 VITALS — BP 131/92 | HR 69 | Temp 98.1°F | Resp 23 | Ht 64.0 in | Wt 149.0 lb

## 2013-09-30 DIAGNOSIS — R110 Nausea: Secondary | ICD-10-CM

## 2013-09-30 DIAGNOSIS — R1084 Generalized abdominal pain: Secondary | ICD-10-CM

## 2013-09-30 DIAGNOSIS — R634 Abnormal weight loss: Secondary | ICD-10-CM

## 2013-09-30 DIAGNOSIS — K319 Disease of stomach and duodenum, unspecified: Secondary | ICD-10-CM

## 2013-09-30 DIAGNOSIS — K259 Gastric ulcer, unspecified as acute or chronic, without hemorrhage or perforation: Secondary | ICD-10-CM

## 2013-09-30 DIAGNOSIS — R197 Diarrhea, unspecified: Secondary | ICD-10-CM

## 2013-09-30 HISTORY — DX: Gastric ulcer, unspecified as acute or chronic, without hemorrhage or perforation: K25.9

## 2013-09-30 LAB — GLUCOSE, CAPILLARY
GLUCOSE-CAPILLARY: 82 mg/dL (ref 70–99)
GLUCOSE-CAPILLARY: 68 mg/dL — AB (ref 70–99)
GLUCOSE-CAPILLARY: 70 mg/dL (ref 70–99)
GLUCOSE-CAPILLARY: 85 mg/dL (ref 70–99)
Glucose-Capillary: 91 mg/dL (ref 70–99)

## 2013-09-30 MED ORDER — SODIUM CHLORIDE 0.9 % IV SOLN: 500.0000 mL | INTRAVENOUS | Status: DC

## 2013-09-30 NOTE — Progress Notes (Signed)
A/ox3, pleased with MAC, report to RN 

## 2013-09-30 NOTE — Op Note (Addendum)
[facility]  [address] [place], [id number]   ENDOSCOPY PROCEDURE REPORT  PATIENT: Krista Carter, Krista Carter  MR#: [id number] BIRTHDATE: June 28, 1955 , 66  yrs. old GENDER: Female ENDOSCOPIST: Lafayette Dragon, MD REFERRED BY:  Donnie Coffin, M.D. PROCEDURE DATE:  09/30/2013 PROCEDURE:  EGD w/ biopsy ASA CLASS:     Class II INDICATIONS:  last endoscopy in 2008 showed gastritis and duodenitis.  Patient has been taking BC powders. MEDICATIONS: MAC sedation, administered by CRNA and propofol (Diprivan) 250mg  IV TOPICAL ANESTHETIC: none  DESCRIPTION OF PROCEDURE: After the risks benefits and alternatives of the procedure were thoroughly explained, informed consent was obtained.  The LB [id number] endoscope was introduced through the mouth and advanced to the second portion of the duodenum. Without limitations.  The instrument was slowly withdrawn as the mucosa was fully examined.      Esophagus: proximal mid and distal esophageal mucosa was unremarkable. There was the mild irregularity of the squamocolumnar junction. No stricture. No hiatal hernia Stomach:  gastric folds were normal. In the gastric antrum there were 2 discrete gastric ulcers one in the prepyloric antrum measuring at least 1-1/2-2 cm in diameter,  shallow surrounded with erythema. It appeared benign. Biopsies were obtained. Another ulcer was   nearby in the prepyloric antrum and measured 4-5 mm in diameter. There were  satellite erosions around the ulcers as well as erythema consistent with gastritis. Pyloric outlet was normal Duodenum: duodenal bulb and descending duodenum is normal[ The scope was then withdrawn from the patient and the procedure completed.  COMPLICATIONS: There were no complications. ENDOSCOPIC IMPRESSION: 2 gastric ulcers in the gastric antrum most likely benign. Status post biopsies. Antral gastritisRule out H. pylori No active bleeding  RECOMMENDATIONS: 1.  Await  pathology results 2.  Discontinue oral anti-inflammatory agents and aspirin 3.  Continue PPI 4.  repeat upper endoscopy in 6 weeks way she will complete healing of the ulcer  REPEAT EXAM: In 6 week(s)  for EGD .  eSigned:  Lafayette Dragon, MD 11/18/2013 7:03 AM Revised: 11/18/2013 7:03 AM  CC:  PATIENT NAME:  Krista Carter, Krista Carter MR#: [id number]

## 2013-09-30 NOTE — Patient Instructions (Signed)
YOU HAD AN ENDOSCOPIC PROCEDURE TODAY AT [facility]: Refer to the procedure report that was given to you for any specific questions about what was found during the examination.  If the procedure report does not answer your questions, please call your gastroenterologist to clarify.  If you requested that your care partner not be given the details of your procedure findings, then the procedure report has been included in a sealed envelope for you to review at your convenience later.  YOU SHOULD EXPECT: Some feelings of bloating in the abdomen. Passage of more gas than usual.  Walking can help get rid of the air that was put into your GI tract during the procedure and reduce the bloating. If you had a lower endoscopy (such as a colonoscopy or flexible sigmoidoscopy) you may notice spotting of blood in your stool or on the toilet paper. If you underwent a bowel prep for your procedure, then you may not have a normal bowel movement for a few days.  DIET: Your first meal following the procedure should be a light meal and then it is ok to progress to your normal diet.  A half-sandwich or bowl of soup is an example of a good first meal.  Heavy or fried foods are harder to digest and may make you feel nauseous or bloated.  Likewise meals heavy in dairy and vegetables can cause extra gas to form and this can also increase the bloating.  Drink plenty of fluids but you should avoid alcoholic beverages for 24 hours.  ACTIVITY: Your care partner should take you home directly after the procedure.  You should plan to take it easy, moving slowly for the rest of the day.  You can resume normal activity the day after the procedure however you should NOT DRIVE or use heavy machinery for 24 hours (because of the sedation medicines used during the test).    SYMPTOMS TO REPORT IMMEDIATELY: A gastroenterologist can be reached at any hour.  During normal business hours, 8:30 AM to 5:00 PM Monday through Friday,  call [phone].  After hours and on weekends, please call the GI answering service at [phone] who will take a message and have the physician on call contact you.  Dr, Olevia Perches thinks that your [facility] powders are causing your ulcers.  She wants you to stop using them and take tylenol.  She can also take mylanta and maalox to help with the pain.   Take your prilosec every day as ordered.   Following lower endoscopy (colonoscopy or flexible sigmoidoscopy):  Excessive amounts of blood in the stool  Significant tenderness or worsening of abdominal pains  Swelling of the abdomen that is new, acute  Fever of 100F or higher  Following upper endoscopy (EGD)  Vomiting of blood or coffee ground material  New chest pain or pain under the shoulder blades  Painful or persistently difficult swallowing  New shortness of breath  Fever of 100F or higher  Black, tarry-looking stools  FOLLOW UP: If any biopsies were taken you will be contacted by phone or by letter within the next 1-3 weeks.  Call your gastroenterologist if you have not heard about the biopsies in 3 weeks.  Our staff will call the home number listed on your records the next business day following your procedure to check on you and address any questions or concerns that you may have at that time regarding the information given to you following your procedure. This is a Manufacturing engineer  call and so if there is no answer at the home number and we have not heard from you through the emergency physician on call, we will assume that you have returned to your regular daily activities without incident.  SIGNATURES/CONFIDENTIALITY: You and/or your care partner have signed paperwork which will be entered into your electronic medical record.  These signatures attest to the fact that that the information above on your After Visit Summary has been reviewed and is understood.  Full responsibility of the confidentiality of this discharge information lies with  you and/or your care-partner.

## 2013-09-30 NOTE — Op Note (Signed)
[place]  [address] [place], [address]   COLONOSCOPY PROCEDURE REPORT  PATIENT: Krista Carter, Krista Carter  MR#: [id number] BIRTHDATE: 1956-03-07 , 56  yrs. old GENDER: Female ENDOSCOPIST: Lafayette Dragon, MD REFERRED AG:TXMI Alroy Dust, M.D. PROCEDURE DATE:  09/30/2013 PROCEDURE:   Colonoscopy, screening First Screening Colonoscopy - Avg.  risk and is 50 yrs.  old or older - No.  First Screening Colonoscopy - Avg.  risk and is 50 yrs.  old or older Yes.  Prior Negative Screening - Now for repeat screening.  N/A  History of Adenoma - Now for follow-up colonoscopy & has been > or = to 3 yrs.  N/A  Polyps Removed Today? No. Recommend repeat exam, <10 yrs? No. ASA CLASS:   Class II INDICATIONS:Average risk patient for colon cancer. MEDICATIONS: MAC sedation, administered by CRNA and propofol (Diprivan) 150mg  IV  DESCRIPTION OF PROCEDURE:   After the risks benefits and alternatives of the procedure were thoroughly explained, informed consent was obtained.  A digital rectal exam revealed no abnormalities of the rectum.   The LB [id number]  endoscope was introduced through the anus and advanced to the cecum, which was identified by both the appendix and ileocecal valve. No adverse events experienced.   The quality of the prep was good, using MoviPrep  The instrument was then slowly withdrawn as the colon was fully examined.      COLON FINDINGS: A normal appearing cecum, ileocecal valve, and appendiceal orifice were identified.  The ascending, hepatic flexure, transverse, splenic flexure, descending, sigmoid colon and rectum appeared unremarkable.  No polyps or cancers were seen. Retroflexed views revealed no abnormalities. The time to cecum=3 minutes 56 seconds.  Withdrawal time=7 minutes 40 seconds.  The scope was withdrawn and the procedure completed. COMPLICATIONS: There were no complications.  ENDOSCOPIC IMPRESSION: Normal  colon  RECOMMENDATIONS:   eSigned:  Lafayette Dragon, MD 09/30/2013 3:23 PM   cc:

## 2013-09-30 NOTE — Progress Notes (Signed)
Called to room to assist during endoscopic procedure.  Patient ID and intended procedure confirmed with present staff. Received instructions for my participation in the procedure from the performing physician.  

## 2013-09-30 NOTE — Progress Notes (Signed)
Notified crna ,Laural Roes           That pt drank "a sip of 7 up" at 1315 today.

## 2013-10-01 ENCOUNTER — Telehealth: Payer: Self-pay | Admitting: *Deleted

## 2013-10-01 NOTE — Telephone Encounter (Signed)
Left message that we called for f/u 

## 2013-10-08 ENCOUNTER — Encounter: Payer: Self-pay | Admitting: Internal Medicine

## 2013-10-21 ENCOUNTER — Telehealth: Payer: Self-pay | Admitting: Nurse Practitioner

## 2013-10-21 MED ORDER — SUCRALFATE 1 G PO TABS: 1.0000 g | ORAL_TABLET | Freq: Two times a day (BID) | ORAL | Status: DC

## 2013-10-21 MED ORDER — PANTOPRAZOLE SODIUM 40 MG PO TBEC: 40.0000 mg | DELAYED_RELEASE_TABLET | Freq: Two times a day (BID) | ORAL | Status: DC

## 2013-10-21 NOTE — Telephone Encounter (Signed)
Please increase Protonix to 40 mg po bid, Carafate 1gm, 1 po bid. She needs follow up EGD  In mid August ( 6-8 weeks foloowinf 09/30/2013 EGD for gatric ulcers).

## 2013-10-21 NOTE — Telephone Encounter (Signed)
Patient calling to report that she is still having diarrhea and stomach pain. States she has had diarrhea 3 times today. Denies bleeding. Pain is below belly button. She is unable to describe the pain. States "it just hurts." She has not taken anything for diarrhea or pain. Please, advise.

## 2013-10-21 NOTE — Telephone Encounter (Signed)
Rx's sent to pharmacy.Patient notified of recommendations. She is scheduled already for procedure.

## 2013-11-06 ENCOUNTER — Ambulatory Visit (AMBULATORY_SURGERY_CENTER): Payer: Self-pay

## 2013-11-06 VITALS — Ht 64.0 in | Wt 144.4 lb

## 2013-11-06 DIAGNOSIS — K253 Acute gastric ulcer without hemorrhage or perforation: Secondary | ICD-10-CM

## 2013-11-06 NOTE — Progress Notes (Signed)
No allergies to eggs or soy No home oxygen No diet/weight loss meds No past problems with anesthesia  Has email

## 2013-11-09 ENCOUNTER — Encounter: Payer: Self-pay | Admitting: Internal Medicine

## 2013-11-18 ENCOUNTER — Encounter: Payer: Self-pay | Admitting: Internal Medicine

## 2013-11-18 ENCOUNTER — Ambulatory Visit (AMBULATORY_SURGERY_CENTER): Payer: BC Managed Care – PPO | Admitting: Internal Medicine

## 2013-11-18 VITALS — BP 159/105 | HR 76 | Temp 98.1°F | Resp 19 | Ht 64.0 in | Wt 144.0 lb

## 2013-11-18 DIAGNOSIS — K253 Acute gastric ulcer without hemorrhage or perforation: Secondary | ICD-10-CM

## 2013-11-18 LAB — GLUCOSE, CAPILLARY
Glucose-Capillary: 88 mg/dL (ref 70–99)
Glucose-Capillary: 95 mg/dL (ref 70–99)

## 2013-11-18 MED ORDER — SODIUM CHLORIDE 0.9 % IV SOLN: 500.0000 mL | INTRAVENOUS | Status: DC

## 2013-11-18 MED ORDER — PB-HYOSCY-ATROPINE-SCOPOLAMINE 16.2 MG PO TABS: 1.0000 | ORAL_TABLET | Freq: Two times a day (BID) | ORAL | Status: DC

## 2013-11-18 NOTE — Patient Instructions (Signed)
YOU HAD AN ENDOSCOPIC PROCEDURE TODAY AT THE [facility]: Refer to the procedure report that was given to you for any specific questions about what was found during the examination.  If the procedure report does not answer your questions, please call your gastroenterologist to clarify.  If you requested that your care partner not be given the details of your procedure findings, then the procedure report has been included in a sealed envelope for you to review at your convenience later.  YOU SHOULD EXPECT: Some feelings of bloating in the abdomen. Passage of more gas than usual.  Walking can help get rid of the air that was put into your GI tract during the procedure and reduce the bloating. If you had a lower endoscopy (such as a colonoscopy or flexible sigmoidoscopy) you may notice spotting of blood in your stool or on the toilet paper. If you underwent a bowel prep for your procedure, then you may not have a normal bowel movement for a few days.  DIET: Your first meal following the procedure should be a light meal and then it is ok to progress to your normal diet.  A half-sandwich or bowl of soup is an example of a good first meal.  Heavy or fried foods are harder to digest and may make you feel nauseous or bloated.  Likewise meals heavy in dairy and vegetables can cause extra gas to form and this can also increase the bloating.  Drink plenty of fluids but you should avoid alcoholic beverages for 24 hours.  ACTIVITY: Your care partner should take you home directly after the procedure.  You should plan to take it easy, moving slowly for the rest of the day.  You can resume normal activity the day after the procedure however you should NOT DRIVE or use heavy machinery for 24 hours (because of the sedation medicines used during the test).    SYMPTOMS TO REPORT IMMEDIATELY: A gastroenterologist can be reached at any hour.  During normal business hours, 8:30 AM to 5:00 PM Monday through Friday,  call [phone].  After hours and on weekends, please call the GI answering service at [phone] who will take a message and have the physician on call contact you.    Following upper endoscopy (EGD)  Vomiting of blood or coffee ground material  New chest pain or pain under the shoulder blades  Painful or persistently difficult swallowing  New shortness of breath  Fever of 100F or higher  Black, tarry-looking stools  FOLLOW UP: If any biopsies were taken you will be contacted by phone or by letter within the next 1-3 weeks.  Call your gastroenterologist if you have not heard about the biopsies in 3 weeks.  Our staff will call the home number listed on your records the next business day following your procedure to check on you and address any questions or concerns that you may have at that time regarding the information given to you following your procedure. This is a courtesy call and so if there is no answer at the home number and we have not heard from you through the emergency physician on call, we will assume that you have returned to your regular daily activities without incident.  SIGNATURES/CONFIDENTIALITY: You and/or your care partner have signed paperwork which will be entered into your electronic medical record.  These signatures attest to the fact that that the information above on your After Visit Summary has been reviewed and is understood.  Full   responsibility of the confidentiality of this discharge information lies with you and/or your care-partner.   Continue to Avoid NSAIDS, RESUME remainder of medications.

## 2013-11-18 NOTE — Progress Notes (Signed)
Report to PACU, RN, vss, BBS= Clear.  

## 2013-11-18 NOTE — Op Note (Signed)
[place]  [address]. [place], [address]   ENDOSCOPY PROCEDURE REPORT  PATIENT: Krista Carter, Krista Carter  MR#: [id number] BIRTHDATE: 07/02/1955 , 4  yrs. old GENDER: Female ENDOSCOPIST: Lafayette Dragon, MD REFERRED BY:  Donnie Coffin, M.D. PROCEDURE DATE:  11/18/2013 PROCEDURE:  EGD, diagnostic ASA CLASS:     Class II INDICATIONS:  followup gastric ulcers found on upper endoscopy in July 2015 ulcers were benign and attributes to NSAIDs, she has been on PPIs and has no complaints. MEDICATIONS: MAC sedation, administered by CRNA and propofol (Diprivan) 200mg  IV TOPICAL ANESTHETIC: none  DESCRIPTION OF PROCEDURE: After the risks benefits and alternatives of the procedure were thoroughly explained, informed consent was obtained.  The LB [id number] endoscope was introduced through the mouth and advanced to the second portion of the duodenum. Without limitations.  The instrument was slowly withdrawn as the mucosa was fully examined.      Esophagus: esophageal mucosa was normal in proximal and midesophagus. There was whitish exudate distally indicating mild esophagitis. There was no stricture. Squamocolumnar junction was normal  Stomach: the gastric body was unremarkable. There were 4 small gastric ulcers circumferentially around the pyloric outlet. The largest ulcer has decreased from 15 mm to 5 mm. The other 3 ulcers measured 3-5 mm in diameter and they were surrounded by erythema. Gastric outlet was patent. There were no stigmata of bleeding. Retroflexion of the endoscope revealed normal fundus and cardia  Duodenum: duodenal bulb and descending duodenum was normal[ The scope was then withdrawn from the patient and the procedure completed.  COMPLICATIONS: There were no complications. ENDOSCOPIC IMPRESSION:  persistent bouts smaller gastric ulcers in prepyloric Ultram with no stigmata of bleeding Distal esophagitis  RECOMMENDATIONS: Continue  PPIs Continue to hold NSAIDs Stop smoking Add Carafate 1 g twice a day  REPEAT EXAM: for EGD pending biopsy results.  eSigned:  Lafayette Dragon, MD 11/18/2013 4:16 PM   CC:  PATIENT NAME:  Krista Carter, Krista Carter MR#: [id number]

## 2013-11-19 ENCOUNTER — Telehealth: Payer: Self-pay

## 2013-11-19 NOTE — Telephone Encounter (Signed)
Left message on answering machine. 

## 2013-11-26 ENCOUNTER — Emergency Department (HOSPITAL_COMMUNITY): Payer: BC Managed Care – PPO

## 2013-11-26 ENCOUNTER — Encounter (HOSPITAL_COMMUNITY): Payer: Self-pay | Admitting: Emergency Medicine

## 2013-11-26 ENCOUNTER — Observation Stay (HOSPITAL_COMMUNITY)
Admission: EM | Admit: 2013-11-26 | Discharge: 2013-11-28 | Disposition: A | Discharge status: Home / Self Care | Payer: BC Managed Care – PPO | Attending: Internal Medicine | Admitting: Internal Medicine

## 2013-11-26 DIAGNOSIS — Z7982 Long term (current) use of aspirin: Secondary | ICD-10-CM | POA: Insufficient documentation

## 2013-11-26 DIAGNOSIS — R0989 Other specified symptoms and signs involving the circulatory and respiratory systems: Secondary | ICD-10-CM

## 2013-11-26 DIAGNOSIS — F17200 Nicotine dependence, unspecified, uncomplicated: Secondary | ICD-10-CM | POA: Diagnosis not present

## 2013-11-26 DIAGNOSIS — E876 Hypokalemia: Secondary | ICD-10-CM

## 2013-11-26 DIAGNOSIS — K589 Irritable bowel syndrome without diarrhea: Secondary | ICD-10-CM

## 2013-11-26 DIAGNOSIS — R0681 Apnea, not elsewhere classified: Secondary | ICD-10-CM

## 2013-11-26 DIAGNOSIS — N200 Calculus of kidney: Secondary | ICD-10-CM

## 2013-11-26 DIAGNOSIS — Z9189 Other specified personal risk factors, not elsewhere classified: Secondary | ICD-10-CM

## 2013-11-26 DIAGNOSIS — R42 Dizziness and giddiness: Secondary | ICD-10-CM | POA: Diagnosis present

## 2013-11-26 DIAGNOSIS — IMO0002 Reserved for concepts with insufficient information to code with codable children: Secondary | ICD-10-CM | POA: Insufficient documentation

## 2013-11-26 DIAGNOSIS — E785 Hyperlipidemia, unspecified: Secondary | ICD-10-CM | POA: Diagnosis not present

## 2013-11-26 DIAGNOSIS — K2980 Duodenitis without bleeding: Secondary | ICD-10-CM

## 2013-11-26 DIAGNOSIS — K259 Gastric ulcer, unspecified as acute or chronic, without hemorrhage or perforation: Secondary | ICD-10-CM

## 2013-11-26 DIAGNOSIS — E860 Dehydration: Secondary | ICD-10-CM | POA: Diagnosis not present

## 2013-11-26 DIAGNOSIS — E43 Unspecified severe protein-calorie malnutrition: Secondary | ICD-10-CM | POA: Insufficient documentation

## 2013-11-26 DIAGNOSIS — I959 Hypotension, unspecified: Secondary | ICD-10-CM | POA: Diagnosis not present

## 2013-11-26 DIAGNOSIS — R197 Diarrhea, unspecified: Secondary | ICD-10-CM

## 2013-11-26 DIAGNOSIS — N179 Acute kidney failure, unspecified: Secondary | ICD-10-CM

## 2013-11-26 DIAGNOSIS — R0609 Other forms of dyspnea: Secondary | ICD-10-CM

## 2013-11-26 DIAGNOSIS — R112 Nausea with vomiting, unspecified: Secondary | ICD-10-CM

## 2013-11-26 DIAGNOSIS — K219 Gastro-esophageal reflux disease without esophagitis: Secondary | ICD-10-CM | POA: Insufficient documentation

## 2013-11-26 DIAGNOSIS — E119 Type 2 diabetes mellitus without complications: Secondary | ICD-10-CM | POA: Diagnosis not present

## 2013-11-26 DIAGNOSIS — K449 Diaphragmatic hernia without obstruction or gangrene: Secondary | ICD-10-CM

## 2013-11-26 DIAGNOSIS — I10 Essential (primary) hypertension: Secondary | ICD-10-CM

## 2013-11-26 DIAGNOSIS — R079 Chest pain, unspecified: Secondary | ICD-10-CM | POA: Diagnosis not present

## 2013-11-26 DIAGNOSIS — R634 Abnormal weight loss: Secondary | ICD-10-CM

## 2013-11-26 DIAGNOSIS — R1084 Generalized abdominal pain: Secondary | ICD-10-CM

## 2013-11-26 DIAGNOSIS — R110 Nausea: Secondary | ICD-10-CM

## 2013-11-26 LAB — BASIC METABOLIC PANEL
Anion gap: 17 — ABNORMAL HIGH (ref 5–15)
BUN: 32 mg/dL — ABNORMAL HIGH (ref 6–23)
CALCIUM: 9.9 mg/dL (ref 8.4–10.5)
CO2: 23 meq/L (ref 19–32)
CREATININE: 1.67 mg/dL — AB (ref 0.50–1.10)
Chloride: 101 mEq/L (ref 96–112)
GFR calc Af Amer: 39 mL/min — ABNORMAL LOW (ref >90)
GFR calc non Af Amer: 33 mL/min — ABNORMAL LOW (ref >90)
Glucose, Bld: 108 mg/dL — ABNORMAL HIGH (ref 70–99)
Potassium: 3.1 mEq/L — ABNORMAL LOW (ref 3.7–5.3)
Sodium: 141 mEq/L (ref 137–147)

## 2013-11-26 LAB — CBC
HEMATOCRIT: 44.6 % (ref 36.0–46.0)
Hemoglobin: 15.2 g/dL — ABNORMAL HIGH (ref 12.0–15.0)
MCH: 32.4 pg (ref 26.0–34.0)
MCHC: 34.1 g/dL (ref 30.0–36.0)
MCV: 95.1 fL (ref 78.0–100.0)
Platelets: 208 10*3/uL (ref 150–400)
RBC: 4.69 MIL/uL (ref 3.87–5.11)
RDW: 14.2 % (ref 11.5–15.5)
WBC: 8.8 10*3/uL (ref 4.0–10.5)

## 2013-11-26 LAB — I-STAT TROPONIN, ED: Troponin i, poc: 0 ng/mL (ref 0.00–0.08)

## 2013-11-26 MED ORDER — SODIUM CHLORIDE 0.9 % IV SOLN
1000.0000 mL | Freq: Once | INTRAVENOUS | Status: AC
  Administered 2013-11-26: 1000 mL via INTRAVENOUS

## 2013-11-26 MED ORDER — POTASSIUM CHLORIDE CRYS ER 20 MEQ PO TBCR
40.0000 meq | EXTENDED_RELEASE_TABLET | Freq: Once | ORAL | Status: AC
  Administered 2013-11-27: 40 meq via ORAL
  Filled 2013-11-26: qty 2

## 2013-11-26 MED ORDER — ONDANSETRON HCL 4 MG/2ML IJ SOLN
4.0000 mg | Freq: Once | INTRAMUSCULAR | Status: AC
  Administered 2013-11-27: 4 mg via INTRAVENOUS
  Filled 2013-11-26: qty 2

## 2013-11-26 MED ORDER — POTASSIUM CHLORIDE 10 MEQ/100ML IV SOLN
10.0000 meq | Freq: Once | INTRAVENOUS | Status: AC
  Administered 2013-11-27: 10 meq via INTRAVENOUS
  Filled 2013-11-26: qty 100

## 2013-11-26 MED ORDER — SODIUM CHLORIDE 0.9 % IV SOLN
1000.0000 mL | INTRAVENOUS | Status: DC
Start: 2013-11-27 — End: 2013-11-28
  Administered 2013-11-27: 1000 mL via INTRAVENOUS

## 2013-11-26 NOTE — ED Provider Notes (Signed)
CSN: [id number]     Arrival date & time 11/26/13  1824 History   First MD Initiated Contact with Patient 11/26/13 2333     Chief Complaint  Patient presents with  . Chest Pain  . Dizziness     (Consider location/radiation/quality/duration/timing/severity/associated sxs/prior Treatment) Patient is a 58 y.o. female presenting with chest pain and dizziness. The history is provided by the patient.  Chest Pain Associated symptoms: dizziness   Dizziness Associated symptoms: chest pain   She comes in because of lightheadedness when she stands up and low blood pressure at home. She states that she has had no appetite, and she has had lightheadedness when she stands for about the last 3 days. She took her blood pressure at home today and was 71 systolic. She has been having diarrhea for about the last 6 months and during the same time, she has been having some crampy mid abdominal pain. There has been no blood in the stool. She has not had fever, chills, sweats. That she has had anorexia, there has been no nausea or vomiting. She is being treated for gastric ulcer and had an EGD done last week which showed that the ulcers are the process of healing. She has taken Maalox but has not been on anything else further diarrhea.  Past Medical History  Diagnosis Date  . Diabetes mellitus   . Hypertension   . Hypercholesteremia   . Renal insufficiency   . GERD (gastroesophageal reflux disease)   . Hiatal hernia    Past Surgical History  Procedure Laterality Date  . Abdominal hysterectomy     Family History  Problem Relation Age of Onset  . Stroke Mother   . Kidney cancer Mother   . Colon cancer Maternal Uncle    History  Substance Use Topics  . Smoking status: Current Every Day Smoker -- 1.00 packs/day    Types: Cigarettes  . Smokeless tobacco: Never Used  . Alcohol Use: No   OB History   Grav Para Term Preterm Abortions TAB SAB Ect Mult Living                 Review of Systems   Cardiovascular: Positive for chest pain.  Neurological: Positive for dizziness.  All other systems reviewed and are negative.     Allergies  Review of patient's allergies indicates no known allergies.  Home Medications   Prior to Admission medications   Medication Sig Start Date End Date Taking? Authorizing Provider  aspirin EC 81 MG tablet Take 81 mg by mouth daily.     Yes Historical Provider, MD  [place] alk-PHENObarbital (DONNATAL) 16.2 MG tablet Take 1 tablet by mouth 2 (two) times daily at 10 AM and 5 PM. 11/18/13  Yes Lafayette Dragon, MD  clonazePAM (KLONOPIN) 1 MG tablet Take 1 mg by mouth 2 (two) times daily.     Yes Historical Provider, MD  dicyclomine (BENTYL) 20 MG tablet  08/13/13  Yes Historical Provider, MD  diltiazem (TIAZAC) 120 MG 24 hr capsule Take 240 mg by mouth daily.     Yes Historical Provider, MD  lisinopril (PRINIVIL,ZESTRIL) 20 MG tablet Take 0.5 tablets (10 mg total) by mouth daily. 11/27/12  Yes Thurnell Lose, MD  metFORMIN (GLUCOPHAGE) 500 MG tablet Take 250 mg by mouth daily.   Yes Historical Provider, MD  metoprolol (TOPROL-XL) 100 MG 24 hr tablet Take 100 mg by mouth daily.     Yes Historical Provider, MD  niacin (NIASPAN) 500 MG CR  tablet Take 500 mg by mouth at bedtime.     Yes Historical Provider, MD  pantoprazole (PROTONIX) 40 MG tablet Take 40 mg by mouth 2 (two) times daily. 10/21/13  Yes Lafayette Dragon, MD  potassium chloride SA (K-DUR,KLOR-CON) 20 MEQ tablet Take 20 mEq by mouth daily.   Yes Historical Provider, MD  sertraline (ZOLOFT) 50 MG tablet Take 50 mg by mouth daily.   Yes Historical Provider, MD  sucralfate (CARAFATE) 1 G tablet Take 1 g by mouth 2 (two) times daily. 10/21/13  Yes Lafayette Dragon, MD  VENTOLIN HFA 108 (90 BASE) MCG/ACT inhaler Inhale 1 puff into the lungs every 4 (four) hours as needed for wheezing or shortness of breath.  07/23/13  Yes Historical Provider, MD  zolpidem (AMBIEN) 10 MG tablet Take 5 mg by mouth at bedtime as  needed for sleep. For insomnia/anxiety   Yes Historical Provider, MD   BP 100/73  Pulse 66  Temp(Src) 97.9 F (36.6 C) (Oral)  Resp 16  SpO2 100% Physical Exam  Nursing note and vitals reviewed.  58 year old female, resting comfortably and in no acute distress. Vital signs are normal. Oxygen saturation is 100%, which is normal. Head is normocephalic and atraumatic. PERRLA, EOMI. Oropharynx is clear. Conjunctivae are pink. Neck is nontender and supple without adenopathy or JVD. Back is nontender and there is no CVA tenderness. Lungs are clear without rales, wheezes, or rhonchi. Chest is nontender. Heart has regular rate and rhythm without murmur. Abdomen is soft, flat, with some mild periumbilical tenderness. There is a rebound or guarding. There are no masses or hepatosplenomegaly and peristalsis is normoactive. Extremities have no cyanosis or edema, full range of motion is present. Skin is warm and dry without rash. Neurologic: Mental status is normal, cranial nerves are intact, there are no motor or sensory deficits.  ED Course  Procedures (including critical care time) Labs Review Results for orders placed during the hospital encounter of 11/26/13  CBC      Result Value Ref Range   WBC 8.8  4.0 - 10.5 K/uL   RBC 4.69  3.87 - 5.11 MIL/uL   Hemoglobin 15.2 (*) 12.0 - 15.0 g/dL   HCT 44.6  36.0 - 46.0 %   MCV 95.1  78.0 - 100.0 fL   MCH 32.4  26.0 - 34.0 pg   MCHC 34.1  30.0 - 36.0 g/dL   RDW 14.2  11.5 - 15.5 %   Platelets 208  150 - 400 K/uL  BASIC METABOLIC PANEL      Result Value Ref Range   Sodium 141  137 - 147 mEq/L   Potassium 3.1 (*) 3.7 - 5.3 mEq/L   Chloride 101  96 - 112 mEq/L   CO2 23  19 - 32 mEq/L   Glucose, Bld 108 (*) 70 - 99 mg/dL   BUN 32 (*) 6 - 23 mg/dL   Creatinine, Ser 1.67 (*) 0.50 - 1.10 mg/dL   Calcium 9.9  8.4 - 10.5 mg/dL   GFR calc non Af Amer 33 (*) >90 mL/min   GFR calc Af Amer 39 (*) >90 mL/min   Anion gap 17 (*) 5 - 15  CBC WITH  DIFFERENTIAL      Result Value Ref Range   WBC 8.0  4.0 - 10.5 K/uL   RBC 4.32  3.87 - 5.11 MIL/uL   Hemoglobin 13.6  12.0 - 15.0 g/dL   HCT 41.5  36.0 - 46.0 %  MCV 96.1  78.0 - 100.0 fL   MCH 31.5  26.0 - 34.0 pg   MCHC 32.8  30.0 - 36.0 g/dL   RDW 14.3  11.5 - 15.5 %   Platelets 182  150 - 400 K/uL   Neutrophils Relative % 47  43 - 77 %   Neutro Abs 3.8  1.7 - 7.7 K/uL   Lymphocytes Relative 43  12 - 46 %   Lymphs Abs 3.4  0.7 - 4.0 K/uL   Monocytes Relative 6  3 - 12 %   Monocytes Absolute 0.5  0.1 - 1.0 K/uL   Eosinophils Relative 3  0 - 5 %   Eosinophils Absolute 0.2  0.0 - 0.7 K/uL   Basophils Relative 1  0 - 1 %   Basophils Absolute 0.1  0.0 - 0.1 K/uL  GLUCOSE, CAPILLARY      Result Value Ref Range   Glucose-Capillary 136 (*) 70 - 99 mg/dL   Comment 1 Documented in Chart     Comment 2 Notify RN    I-STAT TROPOININ, ED      Result Value Ref Range   Troponin i, poc 0.00  0.00 - 0.08 ng/mL   Comment 3           I-STAT CG4 LACTIC ACID, ED      Result Value Ref Range   Lactic Acid, Venous 0.47 (*) 0.5 - 2.2 mmol/L  CBG MONITORING, ED      Result Value Ref Range   Glucose-Capillary 79  70 - 99 mg/dL   Dg Chest 2 View  11/26/2013   CLINICAL DATA:  Difficulty breathing  EXAM: CHEST  2 VIEW  COMPARISON:  April 24, 2013  FINDINGS: The lungs are clear. The heart size and pulmonary vascularity are normal. No adenopathy. There is postoperative change in the right humerus.  IMPRESSION: No edema or consolidation.   Electronically Signed   By: Lowella Grip M.D.   On: 11/26/2013 20:18     Imaging Review Dg Chest 2 View  11/26/2013   CLINICAL DATA:  Difficulty breathing  EXAM: CHEST  2 VIEW  COMPARISON:  April 24, 2013  FINDINGS: The lungs are clear. The heart size and pulmonary vascularity are normal. No adenopathy. There is postoperative change in the right humerus.  IMPRESSION: No edema or consolidation.   Electronically Signed   By: Lowella Grip M.D.   On:  11/26/2013 20:18     EKG Interpretation   Date/Time:  Thursday November 26 2013 18:30:24 EDT Ventricular Rate:  87 PR Interval:  138 QRS Duration: 82 QT Interval:  366 QTC Calculation: 440 R Axis:   81 Text Interpretation:  Normal sinus rhythm Normal ECG When compared with  ECG of 08/10/2013, No significant change was found Confirmed by [facility]  MD,  Britnay Magnussen ([id number]) on 11/26/2013 11:35:03 PM      MDM   Final diagnoses:  Hypotension, unspecified  Acute kidney injury  Diarrhea  Gastric ulcer, unspecified chronicity    Metastatic dizziness. Although an S. vital signs were normal, when she was brought back to her room, and blood pressures have been running from the mid 70s to mid 56C systolic. Laboratory evaluations, showing hypokalemia, and elevated BUN and creatinine which is a significant change from last night U. from May of this year. Old records are reviewed and she had been admitted for diarrhea with renal insufficiency we'll one year ago. EGD done August 19 showed presence of gastric ulcers which have healed  significantly compared with prior EGD of July 1. Biopsies taken July 1 showed reactive inflammation without evidence of Helicobacter pylori or malignancy. She will be given potassium replacement and IV fluids. She'll need to be admitted. The case is discussed with Dr. Ernestina Patches of diet hospitalist who agrees to come in to evaluate the patient.    Delora Fuel, MD 80/99/83 [id number]

## 2013-11-26 NOTE — ED Notes (Signed)
Presents with 2-3 days of generalized chest pain described as sharp associated with lack of appetite, SOB, dizziness and near syncope. Bilateral breath sounds clear. Nothing makes pain better and nothing makes pain worse.

## 2013-11-27 ENCOUNTER — Encounter (HOSPITAL_COMMUNITY): Payer: Self-pay | Admitting: General Practice

## 2013-11-27 DIAGNOSIS — R42 Dizziness and giddiness: Secondary | ICD-10-CM | POA: Diagnosis present

## 2013-11-27 DIAGNOSIS — E860 Dehydration: Secondary | ICD-10-CM

## 2013-11-27 DIAGNOSIS — N179 Acute kidney failure, unspecified: Secondary | ICD-10-CM

## 2013-11-27 DIAGNOSIS — I959 Hypotension, unspecified: Secondary | ICD-10-CM

## 2013-11-27 DIAGNOSIS — R079 Chest pain, unspecified: Secondary | ICD-10-CM

## 2013-11-27 LAB — PRO B NATRIURETIC PEPTIDE: Pro B Natriuretic peptide (BNP): 68.7 pg/mL (ref 0–125)

## 2013-11-27 LAB — COMPREHENSIVE METABOLIC PANEL
ALT: 8 U/L (ref 0–35)
AST: 11 U/L (ref 0–37)
Albumin: 3.5 g/dL (ref 3.5–5.2)
Alkaline Phosphatase: 73 U/L (ref 39–117)
Anion gap: 11 (ref 5–15)
BUN: 27 mg/dL — ABNORMAL HIGH (ref 6–23)
CO2: 22 meq/L (ref 19–32)
Calcium: 8.4 mg/dL (ref 8.4–10.5)
Chloride: 109 mEq/L (ref 96–112)
Creatinine, Ser: 1.18 mg/dL — ABNORMAL HIGH (ref 0.50–1.10)
GFR calc Af Amer: 59 mL/min — ABNORMAL LOW (ref >90)
GFR, EST NON AFRICAN AMERICAN: 51 mL/min — AB (ref >90)
GLUCOSE: 101 mg/dL — AB (ref 70–99)
POTASSIUM: 3.5 meq/L — AB (ref 3.7–5.3)
SODIUM: 142 meq/L (ref 137–147)
Total Bilirubin: <0.2 mg/dL — ABNORMAL LOW (ref 0.3–1.2)
Total Protein: 6.5 g/dL (ref 6.0–8.3)

## 2013-11-27 LAB — CBC WITH DIFFERENTIAL/PLATELET
BASOS PCT: 1 % (ref 0–1)
Basophils Absolute: 0.1 10*3/uL (ref 0.0–0.1)
EOS ABS: 0.2 10*3/uL (ref 0.0–0.7)
Eosinophils Relative: 3 % (ref 0–5)
HCT: 41.5 % (ref 36.0–46.0)
Hemoglobin: 13.6 g/dL (ref 12.0–15.0)
Lymphocytes Relative: 43 % (ref 12–46)
Lymphs Abs: 3.4 10*3/uL (ref 0.7–4.0)
MCH: 31.5 pg (ref 26.0–34.0)
MCHC: 32.8 g/dL (ref 30.0–36.0)
MCV: 96.1 fL (ref 78.0–100.0)
MONOS PCT: 6 % (ref 3–12)
Monocytes Absolute: 0.5 10*3/uL (ref 0.1–1.0)
NEUTROS PCT: 47 % (ref 43–77)
Neutro Abs: 3.8 10*3/uL (ref 1.7–7.7)
PLATELETS: 182 10*3/uL (ref 150–400)
RBC: 4.32 MIL/uL (ref 3.87–5.11)
RDW: 14.3 % (ref 11.5–15.5)
WBC: 8 10*3/uL (ref 4.0–10.5)

## 2013-11-27 LAB — LIPID PANEL
CHOLESTEROL: 226 mg/dL — AB (ref 0–200)
HDL: 48 mg/dL (ref >39)
LDL Cholesterol: 139 mg/dL — ABNORMAL HIGH (ref 0–99)
TRIGLYCERIDES: 195 mg/dL — AB (ref <150)
Total CHOL/HDL Ratio: 4.7 RATIO
VLDL: 39 mg/dL (ref 0–40)

## 2013-11-27 LAB — URINE MICROSCOPIC-ADD ON

## 2013-11-27 LAB — URINALYSIS, ROUTINE W REFLEX MICROSCOPIC
BILIRUBIN URINE: NEGATIVE
Glucose, UA: NEGATIVE mg/dL
Ketones, ur: NEGATIVE mg/dL
Leukocytes, UA: NEGATIVE
NITRITE: NEGATIVE
PH: 5.5 (ref 5.0–8.0)
Protein, ur: NEGATIVE mg/dL
Specific Gravity, Urine: 1.013 (ref 1.005–1.030)
Urobilinogen, UA: 0.2 mg/dL (ref 0.0–1.0)

## 2013-11-27 LAB — HEMOGLOBIN A1C
HEMOGLOBIN A1C: 5.7 % — AB (ref <5.7)
Mean Plasma Glucose: 117 mg/dL — ABNORMAL HIGH (ref <117)

## 2013-11-27 LAB — CLOSTRIDIUM DIFFICILE BY PCR: CDIFFPCR: NEGATIVE

## 2013-11-27 LAB — TROPONIN I
Troponin I: <0.3 ng/mL (ref <0.30)
Troponin I: <0.3 ng/mL (ref <0.30)
Troponin I: <0.3 ng/mL (ref <0.30)

## 2013-11-27 LAB — GLUCOSE, CAPILLARY
GLUCOSE-CAPILLARY: 98 mg/dL (ref 70–99)
GLUCOSE-CAPILLARY: 105 mg/dL — AB (ref 70–99)
Glucose-Capillary: 136 mg/dL — ABNORMAL HIGH (ref 70–99)
Glucose-Capillary: 104 mg/dL — ABNORMAL HIGH (ref 70–99)

## 2013-11-27 LAB — I-STAT CG4 LACTIC ACID, ED: Lactic Acid, Venous: 0.47 mmol/L — ABNORMAL LOW (ref 0.5–2.2)

## 2013-11-27 LAB — TSH: TSH: 0.846 u[IU]/mL (ref 0.350–4.500)

## 2013-11-27 LAB — MAGNESIUM
MAGNESIUM: 1.9 mg/dL (ref 1.5–2.5)
Magnesium: 2.1 mg/dL (ref 1.5–2.5)

## 2013-11-27 LAB — CBG MONITORING, ED: Glucose-Capillary: 79 mg/dL (ref 70–99)

## 2013-11-27 MED ORDER — SUCRALFATE 1 G PO TABS
1.0000 g | ORAL_TABLET | Freq: Two times a day (BID) | ORAL | Status: DC
  Administered 2013-11-27 – 2013-11-28 (×4): 1 g via ORAL
  Filled 2013-11-27 (×5): qty 1

## 2013-11-27 MED ORDER — INSULIN ASPART 100 UNIT/ML ~~LOC~~ SOLN: 0.0000 [IU] | Freq: Three times a day (TID) | SUBCUTANEOUS | Status: DC

## 2013-11-27 MED ORDER — CLONAZEPAM 1 MG PO TABS
1.0000 mg | ORAL_TABLET | Freq: Two times a day (BID) | ORAL | Status: DC
  Administered 2013-11-27 – 2013-11-28 (×2): 1 mg via ORAL
  Filled 2013-11-27 (×3): qty 1

## 2013-11-27 MED ORDER — PANTOPRAZOLE SODIUM 40 MG PO TBEC
40.0000 mg | DELAYED_RELEASE_TABLET | Freq: Two times a day (BID) | ORAL | Status: DC
  Administered 2013-11-27 – 2013-11-28 (×4): 40 mg via ORAL
  Filled 2013-11-27 (×4): qty 1

## 2013-11-27 MED ORDER — SERTRALINE HCL 50 MG PO TABS
50.0000 mg | ORAL_TABLET | Freq: Every day | ORAL | Status: DC
  Administered 2013-11-27 – 2013-11-28 (×2): 50 mg via ORAL
  Filled 2013-11-27 (×2): qty 1

## 2013-11-27 MED ORDER — SUCRALFATE 1 G PO TABS: 1.0000 g | ORAL_TABLET | Freq: Two times a day (BID) | ORAL | Status: DC

## 2013-11-27 MED ORDER — INSULIN ASPART 100 UNIT/ML ~~LOC~~ SOLN
0.0000 [IU] | SUBCUTANEOUS | Status: DC
  Administered 2013-11-27: 1 [IU] via SUBCUTANEOUS

## 2013-11-27 MED ORDER — ACETAMINOPHEN 325 MG PO TABS
650.0000 mg | ORAL_TABLET | Freq: Four times a day (QID) | ORAL | Status: DC | PRN
  Administered 2013-11-27 – 2013-11-28 (×3): 650 mg via ORAL
  Filled 2013-11-27 (×3): qty 2

## 2013-11-27 MED ORDER — ALBUTEROL SULFATE (2.5 MG/3ML) 0.083% IN NEBU: 3.0000 mL | INHALATION_SOLUTION | RESPIRATORY_TRACT | Status: DC | PRN

## 2013-11-27 MED ORDER — POTASSIUM CHLORIDE CRYS ER 20 MEQ PO TBCR
20.0000 meq | EXTENDED_RELEASE_TABLET | Freq: Every day | ORAL | Status: DC
  Administered 2013-11-27 – 2013-11-28 (×2): 20 meq via ORAL
  Filled 2013-11-27 (×2): qty 1

## 2013-11-27 MED ORDER — ALBUTEROL SULFATE HFA 108 (90 BASE) MCG/ACT IN AERS: 1.0000 | INHALATION_SPRAY | RESPIRATORY_TRACT | Status: DC | PRN

## 2013-11-27 MED ORDER — SODIUM CHLORIDE 0.9 % IJ SOLN
3.0000 mL | Freq: Two times a day (BID) | INTRAMUSCULAR | Status: DC
  Administered 2013-11-28: 3 mL via INTRAVENOUS

## 2013-11-27 MED ORDER — DICYCLOMINE HCL 20 MG PO TABS
20.0000 mg | ORAL_TABLET | Freq: Three times a day (TID) | ORAL | Status: DC
  Administered 2013-11-27 – 2013-11-28 (×2): 20 mg via ORAL
  Filled 2013-11-27 (×7): qty 1

## 2013-11-27 MED ORDER — HEPARIN SODIUM (PORCINE) 5000 UNIT/ML IJ SOLN
5000.0000 [IU] | Freq: Three times a day (TID) | INTRAMUSCULAR | Status: DC
  Administered 2013-11-27 – 2013-11-28 (×4): 5000 [IU] via SUBCUTANEOUS
  Filled 2013-11-27 (×8): qty 1

## 2013-11-27 MED ORDER — ZOLPIDEM TARTRATE 5 MG PO TABS
5.0000 mg | ORAL_TABLET | Freq: Every evening | ORAL | Status: DC | PRN
  Administered 2013-11-27 (×2): 5 mg via ORAL
  Filled 2013-11-27 (×2): qty 1

## 2013-11-27 MED ORDER — POTASSIUM CHLORIDE IN NACL 20-0.9 MEQ/L-% IV SOLN
INTRAVENOUS | Status: DC
  Administered 2013-11-27 – 2013-11-28 (×4): via INTRAVENOUS
  Filled 2013-11-27 (×7): qty 1000

## 2013-11-27 NOTE — Progress Notes (Signed)
Patient admitted after midnight. Chart reviewed. Patient examined. Will get GI pathogen panel and celiac serologies. Continue IVF.  Doree Barthel, MD [facility]

## 2013-11-27 NOTE — Progress Notes (Signed)
UR completed 

## 2013-11-27 NOTE — Progress Notes (Signed)
Nutrition Brief Note  Patient identified on the Malnutrition Screening Tool (MST) Report for recent weight lost without trying and eating poorly because of a decreased appetite.  Per wt readings, patient has had a 5% weight loss since beginning of July 2015 -- not significant for time frame.  Wt Readings from Last 15 Encounters:  11/27/13 142 lb 8 oz (64.638 kg)  11/18/13 144 lb (65.318 kg)  11/06/13 144 lb 6.4 oz (65.499 kg)  09/30/13 149 lb (67.586 kg)  08/17/13 149 lb (67.586 kg)  11/26/12 162 lb 2 oz (73.539 kg)  02/17/10 188 lb (85.276 kg)    Body mass index is 24.45 kg/(m^2). Patient meets criteria for Normal based on current BMI.   Current diet order is Heart Healthy, patient is consuming approximately 75% of meals at this time. Labs and medications reviewed.   No nutrition interventions warranted at this time. If nutrition issues arise, please consult RD.   Arthur Holms, RD, LDN Pager #: [phone] After-Hours Pager #: [phone]

## 2013-11-27 NOTE — H&P (Signed)
Hospitalist Admission History and Physical  Patient name: Krista Carter Medical record number: [id number] Date of birth: 1956-02-14 Age: 58 y.o. Gender: female  Primary Care Provider: Donnie Coffin, MD  Chief Complaint: dizziness, chest pain, dehydration , AKI, hypotension  History of Present Illness:This is a 58 y.o. year old female with significant past medical history of DM, HTN, GERD, recurrent nausea, vomiting, diarrhea, gastric ulcers presenting with dizziness, chest pain, dehydration. Pt states that she has had progressive dizziness and weakness over the past 2-3 days. States that she has had decreased appetite, nausea, vomting and diarrhea which is a chronic issue. Recently had an upper endoscopy done with Dr. Olevia Perches which showed healing ulcers. Also with normal colonoscopy 09/2013. Pt Denies taking any NSAIDs. Is an active smoker. Has had generally worsening appetite. No fevers or chills. Has had multiple episodes of dizziness and near syncope. Has been taking BP meds as prescribed no missed/estra doses. Has had some mild chest pain. Bilateral chests, worse with movement and deep breathing. No assd diaphoresis. Has chronic nausea.  Presented to ER afebrile with BPs in 80s-90s. Satting 99% on RA. CBC and BMET relatively unremarkable apart from K 3.1, Cr 1.67. CXR WNL.   Assessment and Plan: Krista Carter is a 58 y.o. year old female presenting with Dizziness, chest pain, AKI, dehydration, hypotension   Active Problems:   Dizziness   Chest pain   AKI (acute kidney injury)   Dehydration   Hypotension   1- DIzziness/dehydration/hypotension - Likely secondary to hypotension in setting of dehydration likely 2/2 recurrent GI issues  -s/p 2L NS bolus in ER  -NS+ K @ 125/hr -serial orthostatics -hold BP meds -No focal neuro deficits on exam today -consider neuro imaging if sxs persist despite treatment  2-Chest pain  -fairly atypical, though pt with some RFs  -cycle CEs  -risk  stratification labs   3-AKI -likely secondary to #1 -hydrate pt -follow   4-HTN -hold antihypertensives given low BP  -continue to follow.   5-GERD/IBS -continue PPI, carafate   FEN/GI: heart healthy, PPI. Replete K. Check mag level.  Prophylaxis: sub q heparin  Disposition: pending further evaluation  Code Status:Full Code    Patient Active Problem List   Diagnosis Date Noted  . Dizziness 11/27/2013  . Loss of weight 08/17/2013  . Diarrhea 08/17/2013  . Nausea alone 08/17/2013  . Generalized abdominal pain 08/17/2013  . Protein-calorie malnutrition, severe 11/26/2012  . Nausea vomiting and diarrhea 11/25/2012  . Hypokalemia 11/25/2012  . Acute renal failure 11/25/2012  . APNEA 02/17/2010  . SNORING 02/17/2010  . OTH SPEC PERS HX PRESENTING HAZARDS HEALTH OTH 02/17/2010  . HYPERLIPIDEMIA 06/10/2007  . SMOKER 06/10/2007  . HYPERTENSION 06/10/2007  . GERD 06/10/2007  . IBS 06/10/2007  . RENAL CALCULUS 06/10/2007  . DUODENITIS 04/06/2003  . HIATAL HERNIA 04/06/2003   Past Medical History: Past Medical History  Diagnosis Date  . Diabetes mellitus   . Hypertension   . Hypercholesteremia   . Renal insufficiency   . GERD (gastroesophageal reflux disease)   . Hiatal hernia     Past Surgical History: Past Surgical History  Procedure Laterality Date  . Abdominal hysterectomy      Social History: History   Social History  . Marital Status: Married    Spouse Name: N/A    Number of Children: 2  . Years of Education: N/A   Occupational History  . Retired    Social History Main Topics  . Smoking status: Current Every  Day Smoker -- 1.00 packs/day    Types: Cigarettes  . Smokeless tobacco: Never Used  . Alcohol Use: No  . Drug Use: No  . Sexual Activity: Yes    Birth Control/ Protection: None   Other Topics Concern  . None   Social History Narrative  . None    Family History: Family History  Problem Relation Age of Onset  . Stroke Mother   .  Kidney cancer Mother   . Colon cancer Maternal Uncle     Allergies: No Known Allergies  Current Facility-Administered Medications  Medication Dose Route Frequency Provider Last Rate Last Dose  . 0.9 %  sodium chloride infusion  1,000 mL Intravenous Continuous Krista Fuel, MD      . 0.9 % NaCl with KCl 20 mEq/ L  infusion   Intravenous Continuous Shanda Howells, MD      . albuterol (PROVENTIL HFA;VENTOLIN HFA) 108 (90 BASE) MCG/ACT inhaler 1 puff  1 puff Inhalation Q4H PRN Shanda Howells, MD      . heparin injection 5,000 Units  5,000 Units Subcutaneous 3 times per day Shanda Howells, MD      . insulin aspart (novoLOG) injection 0-9 Units  0-9 Units Subcutaneous 6 times per day Shanda Howells, MD      . pantoprazole (PROTONIX) EC tablet 40 mg  40 mg Oral BID Shanda Howells, MD      . sertraline (ZOLOFT) tablet 50 mg  50 mg Oral Daily Shanda Howells, MD      . sodium chloride 0.9 % injection 3 mL  3 mL Intravenous Q12H Shanda Howells, MD      . sucralfate (CARAFATE) tablet 1 g  1 g Oral BID Shanda Howells, MD      . sucralfate (CARAFATE) tablet 1 g  1 g Oral BID Shanda Howells, MD      . zolpidem [facility]) tablet 5 mg  5 mg Oral QHS PRN Shanda Howells, MD       Current Outpatient Prescriptions  Medication Sig Dispense Refill  . aspirin EC 81 MG tablet Take 81 mg by mouth daily.        [place] alk-PHENObarbital (DONNATAL) 16.2 MG tablet Take 1 tablet by mouth 2 (two) times daily at 10 AM and 5 PM.      . clonazePAM (KLONOPIN) 1 MG tablet Take 1 mg by mouth 2 (two) times daily.        [place] Kitchen dicyclomine (BENTYL) 20 MG tablet       . diltiazem (TIAZAC) 120 MG 24 hr capsule Take 240 mg by mouth daily.        Marland Kitchen lisinopril (PRINIVIL,ZESTRIL) 20 MG tablet Take 0.5 tablets (10 mg total) by mouth daily.      . metFORMIN (GLUCOPHAGE) 500 MG tablet Take 250 mg by mouth daily.      . metoprolol (TOPROL-XL) 100 MG 24 hr tablet Take 100 mg by mouth daily.        . niacin (NIASPAN) 500 MG CR tablet Take 500 mg by  mouth at bedtime.        . pantoprazole (PROTONIX) 40 MG tablet Take 40 mg by mouth 2 (two) times daily.      . potassium chloride SA (K-DUR,KLOR-CON) 20 MEQ tablet Take 20 mEq by mouth daily.      . sertraline (ZOLOFT) 50 MG tablet Take 50 mg by mouth daily.      . sucralfate (CARAFATE) 1 G tablet Take 1 g by mouth 2 (two) times  daily.      . VENTOLIN HFA 108 (90 BASE) MCG/ACT inhaler Inhale 1 puff into the lungs every 4 (four) hours as needed for wheezing or shortness of breath.       . zolpidem (AMBIEN) 10 MG tablet Take 5 mg by mouth at bedtime as needed for sleep. For insomnia/anxiety       Review Of Systems: 12 point ROS negative except as noted above in HPI.  Physical Exam: Filed Vitals:   11/27/13 0049  BP: 92/61  Pulse: 63  Temp:   Resp: 14    General: alert and cooperative HEENT: PERRLA and extra ocular movement intact Heart: S1, S2 normal, no murmur, rub or gallop, regular rate and rhythm Lungs: clear to auscultation, no wheezes or rales and unlabored breathing Abdomen: abdomen is soft without significant tenderness, masses, organomegaly or guarding Extremities: extremities normal, atraumatic, no cyanosis or edema Skin:no rashes, no ecchymoses Neurology: normal without focal findings  Labs and Imaging: Lab Results  Component Value Date/Time   NA 141 11/26/2013  6:46 PM   K 3.1* 11/26/2013  6:46 PM   CL 101 11/26/2013  6:46 PM   CO2 23 11/26/2013  6:46 PM   BUN 32* 11/26/2013  6:46 PM   CREATININE 1.67* 11/26/2013  6:46 PM   GLUCOSE 108* 11/26/2013  6:46 PM   Lab Results  Component Value Date   WBC 8.8 11/26/2013   HGB 15.2* 11/26/2013   HCT 44.6 11/26/2013   MCV 95.1 11/26/2013   PLT 208 11/26/2013    Dg Chest 2 View  11/26/2013   CLINICAL DATA:  Difficulty breathing  EXAM: CHEST  2 VIEW  COMPARISON:  April 24, 2013  FINDINGS: The lungs are clear. The heart size and pulmonary vascularity are normal. No adenopathy. There is postoperative change in the right humerus.   IMPRESSION: No edema or consolidation.   Electronically Signed   By: Lowella Grip M.D.   On: 11/26/2013 20:18           Shanda Howells MD  Pager: [phone]

## 2013-11-28 DIAGNOSIS — R197 Diarrhea, unspecified: Secondary | ICD-10-CM

## 2013-11-28 DIAGNOSIS — K259 Gastric ulcer, unspecified as acute or chronic, without hemorrhage or perforation: Secondary | ICD-10-CM

## 2013-11-28 DIAGNOSIS — K589 Irritable bowel syndrome without diarrhea: Secondary | ICD-10-CM

## 2013-11-28 DIAGNOSIS — E860 Dehydration: Secondary | ICD-10-CM

## 2013-11-28 DIAGNOSIS — F17200 Nicotine dependence, unspecified, uncomplicated: Secondary | ICD-10-CM

## 2013-11-28 LAB — BASIC METABOLIC PANEL
ANION GAP: 9 (ref 5–15)
BUN: 14 mg/dL (ref 6–23)
CO2: 23 meq/L (ref 19–32)
CREATININE: 0.77 mg/dL (ref 0.50–1.10)
Calcium: 8.8 mg/dL (ref 8.4–10.5)
Chloride: 110 mEq/L (ref 96–112)
GFR calc Af Amer: >90 mL/min (ref >90)
GFR calc non Af Amer: >90 mL/min (ref >90)
Glucose, Bld: 87 mg/dL (ref 70–99)
Potassium: 3.7 mEq/L (ref 3.7–5.3)
Sodium: 142 mEq/L (ref 137–147)

## 2013-11-28 MED ORDER — LOPERAMIDE HCL 2 MG PO TABS: ORAL_TABLET | ORAL | Status: DC

## 2013-11-28 NOTE — Discharge Summary (Signed)
Physician Discharge Summary  Krista Carter NOI:[id number] DOB: 08-07-1955 DOA: 11/26/2013  PCP: Donnie Coffin, MD  Admit date: 11/26/2013 Discharge date: 11/28/2013    Discharge Diagnoses:   Hypotension Active Problems:   Dizziness   Chest pain   AKI (acute kidney injury)   Dehydration     Discharge Condition: stable   Filed Weights   11/27/13 0156 11/28/13 0500  Weight: 64.638 kg (142 lb 8 oz) 64.4 kg (141 lb 15.6 oz)    History of present illness:  58 y.o. year old female with significant past medical history of DM, HTN, GERD, recurrent nausea, vomiting, diarrhea, gastric ulcers presenting with dizziness, chest pain, dehydration. Pt states that she has had progressive dizziness and weakness over the past 2-3 days. States that she has had decreased appetite, nausea, vomting and diarrhea which is a chronic issue. Recently had an upper endoscopy done with Dr. Olevia Perches which showed healing ulcers. Also with normal colonoscopy 09/2013. Pt Denies taking any NSAIDs. Is an active smoker. Has had generally worsening appetite. No fevers or chills. Has had multiple episodes of dizziness and near syncope. Has been taking BP meds as prescribed no missed/estra doses. Has had some mild chest pain. Bilateral chests, worse with movement and deep breathing. No assd diaphoresis. Has chronic nausea.  Presented to ER afebrile with BPs in 80s-90s. Satting 99% on RA. CBC and BMET relatively unremarkable apart from K 3.1, Cr 1.67. CXR WNL.   Hospital Course:  Fluid rescussitated. Antihypertensives stopped. Stool pathogen panel negative. Tissue transglutaminase ab negative. Endomysial ab negative. No vomiting during hospitalization. Infrequent loose stools, chronic, which has been worked up by Dr. Olevia Perches in the past. By discharge, blood pressure normal, ambulating and feeling much better.  May take imodium PRN. As outpatient. Hold antihypertensives until blood pressure  increases.  Procedures:  none  Consultations:  none  Discharge Exam: Filed Vitals:   11/28/13 0500  BP: 131/80  Pulse: 68  Temp: 97.8 F (36.6 C)  Resp: 19    General: alert, oriented, nontoxic Cardiovascular: rrr Respiratory: CTA abd s, nt, nd   Discharge Instructions   Diet - low sodium heart healthy    Complete by:  As directed      Discharge instructions    Complete by:  As directed   Monitor blood pressure.  Hold Toprol XL, lisinopril, diltiazem until blood pressure greater than 140 (top number), then resume one-by-one. Drink plenty of fluids.     Increase activity slowly    Complete by:  As directed             Medication List    STOP taking these medications       diltiazem 120 MG 24 hr capsule  Commonly known as:  TIAZAC     lisinopril 20 MG tablet  Commonly known as:  PRINIVIL,ZESTRIL     metFORMIN 500 MG tablet  Commonly known as:  GLUCOPHAGE     metoprolol succinate 100 MG 24 hr tablet  Commonly known as:  TOPROL-XL      TAKE these medications       aspirin EC 81 MG tablet  Take 81 mg by mouth daily.     [place] alk-PHENObarbital 16.2 MG tablet  Commonly known as:  DONNATAL  Take 1 tablet by mouth 2 (two) times daily at 10 AM and 5 PM.     clonazePAM 1 MG tablet  Commonly known as:  KLONOPIN  Take 1 mg by mouth 2 (two) times daily.  dicyclomine 20 MG tablet  Commonly known as:  BENTYL     loperamide 2 MG tablet  Commonly known as:  IMODIUM A-D  As instructed for diarrhea     niacin 500 MG CR tablet  Commonly known as:  NIASPAN  Take 500 mg by mouth at bedtime.     pantoprazole 40 MG tablet  Commonly known as:  PROTONIX  Take 40 mg by mouth 2 (two) times daily.     potassium chloride SA 20 MEQ tablet  Commonly known as:  K-DUR,KLOR-CON  Take 20 mEq by mouth daily.     sertraline 50 MG tablet  Commonly known as:  ZOLOFT  Take 50 mg by mouth daily.     sucralfate 1 G tablet  Commonly known as:  CARAFATE  Take 1  g by mouth 2 (two) times daily.     VENTOLIN HFA 108 (90 BASE) MCG/ACT inhaler  Generic drug:  albuterol  Inhale 1 puff into the lungs every 4 (four) hours as needed for wheezing or shortness of breath.     zolpidem 10 MG tablet  Commonly known as:  AMBIEN  Take 5 mg by mouth at bedtime as needed for sleep. For insomnia/anxiety       No Known Allergies     Follow-up Information   Follow up with [facility], [place]. (next week to check blood pressure and symptoms)    Specialty:  Family Medicine   Contact information:   [address] [place] [address] [phone]        The results of significant diagnostics from this hospitalization (including imaging, microbiology, ancillary and laboratory) are listed below for reference.    Significant Diagnostic Studies: Dg Chest 2 View  11/26/2013   CLINICAL DATA:  Difficulty breathing  EXAM: CHEST  2 VIEW  COMPARISON:  April 24, 2013  FINDINGS: The lungs are clear. The heart size and pulmonary vascularity are normal. No adenopathy. There is postoperative change in the right humerus.  IMPRESSION: No edema or consolidation.   Electronically Signed   By: Lowella Grip M.D.   On: 11/26/2013 20:18    Microbiology: Recent Results (from the past 240 hour(s))  CLOSTRIDIUM DIFFICILE BY PCR     Status: None   Collection Time    11/27/13  7:02 AM      Result Value Ref Range Status   C difficile by pcr NEGATIVE  NEGATIVE Final     Labs: Basic Metabolic Panel:  Recent Labs Lab 11/26/13 1846 11/27/13 0535 11/27/13 1512 11/28/13 0410  NA 141 142  --  142  K 3.1* 3.5*  --  3.7  CL 101 109  --  110  CO2 23 22  --  23  GLUCOSE 108* 101*  --  87  BUN 32* 27*  --  14  CREATININE 1.67* 1.18*  --  0.77  CALCIUM 9.9 8.4  --  8.8  MG  --  2.1 1.9  --    Liver Function Tests:  Recent Labs Lab 11/27/13 0535  AST 11  ALT 8  ALKPHOS 73  BILITOT <0.2*  PROT 6.5  ALBUMIN 3.5   No results found for this  basename: LIPASE, AMYLASE,  in the last 168 hours No results found for this basename: AMMONIA,  in the last 168 hours CBC:  Recent Labs Lab 11/26/13 1846 11/27/13 0535  WBC 8.8 8.0  NEUTROABS  --  3.8  HGB 15.2* 13.6  HCT 44.6 41.5  MCV  95.1 96.1  PLT 208 182   Cardiac Enzymes:  Recent Labs Lab 11/27/13 0535 11/27/13 0706 11/27/13 1254  TROPONINI <0.30 <0.30 <0.30   BNP: BNP (last 3 results)  Recent Labs  08/10/13 1440 11/27/13 0535  PROBNP 111.2 68.7   CBG:  Recent Labs Lab 11/27/13 0123 11/27/13 0428 11/27/13 0804 11/27/13 1121 11/27/13 1619  GLUCAP 79 136* 98 105* 104*       Signed:  [place]  [facility] 11/28/2013, 9:38 AM

## 2013-11-28 NOTE — Progress Notes (Signed)
11/28/2013 10:39 AM Nursing note Discharge avs form, medications already taken today and those due this evening given and explained to patient and spouse. rx given and reviewed with patient. Follow up appointments, special instructions for BP and medications reviewed along with when to call MD. Questions and concerns addressed. D/c iv line. D/c tele. D/c home with spouse per orders.  Jahziah Simonin, Arville Lime

## 2013-11-30 LAB — GLIADIN ANTIBODIES, SERUM
GLIADIN IGA: 5.5 U/mL (ref <20)
Gliadin IgG: 3.3 U/mL (ref <20)

## 2013-11-30 LAB — GI PATHOGEN PANEL BY PCR, STOOL
C difficile toxin A/B: NEGATIVE
Campylobacter by PCR: NEGATIVE
Cryptosporidium by PCR: NEGATIVE
E COLI (STEC): NEGATIVE
E coli (ETEC) LT/ST: NEGATIVE
E coli 0157 by PCR: NEGATIVE
G lamblia by PCR: NEGATIVE
Norovirus GI/GII: NEGATIVE
Rotavirus A by PCR: NEGATIVE
SALMONELLA BY PCR: NEGATIVE
Shigella by PCR: NEGATIVE

## 2013-11-30 LAB — GLUCOSE, CAPILLARY: GLUCOSE-CAPILLARY: 86 mg/dL (ref 70–99)

## 2013-11-30 LAB — TISSUE TRANSGLUTAMINASE, IGA: Tissue Transglutaminase Ab, IgA: 5 U/mL (ref <20)

## 2013-11-30 LAB — ENDOMYSIAL IGA ANTIBODY: ENDOMYSIAL IGA AUTOABS: NEGATIVE

## 2013-11-30 LAB — TISSUE TRANSGLUTAMINASE, IGG: TISSUE TRANSGLUT AB: 11 U/mL (ref <20)

## 2015-02-01 DIAGNOSIS — D3500 Benign neoplasm of unspecified adrenal gland: Secondary | ICD-10-CM

## 2015-02-01 HISTORY — DX: Benign neoplasm of unspecified adrenal gland: D35.00

## 2015-02-28 ENCOUNTER — Encounter (HOSPITAL_COMMUNITY): Payer: Self-pay | Admitting: Emergency Medicine

## 2015-02-28 ENCOUNTER — Emergency Department (HOSPITAL_COMMUNITY): Payer: BC Managed Care – PPO

## 2015-02-28 ENCOUNTER — Emergency Department (HOSPITAL_COMMUNITY)
Admission: EM | Admit: 2015-02-28 | Discharge: 2015-02-28 | Disposition: A | Discharge status: Home / Self Care | Payer: BC Managed Care – PPO | Attending: Emergency Medicine | Admitting: Emergency Medicine

## 2015-02-28 DIAGNOSIS — I10 Essential (primary) hypertension: Secondary | ICD-10-CM | POA: Insufficient documentation

## 2015-02-28 DIAGNOSIS — R1084 Generalized abdominal pain: Secondary | ICD-10-CM | POA: Diagnosis not present

## 2015-02-28 DIAGNOSIS — Z8719 Personal history of other diseases of the digestive system: Secondary | ICD-10-CM | POA: Insufficient documentation

## 2015-02-28 DIAGNOSIS — Z79899 Other long term (current) drug therapy: Secondary | ICD-10-CM | POA: Diagnosis not present

## 2015-02-28 DIAGNOSIS — E119 Type 2 diabetes mellitus without complications: Secondary | ICD-10-CM | POA: Insufficient documentation

## 2015-02-28 DIAGNOSIS — R109 Unspecified abdominal pain: Secondary | ICD-10-CM | POA: Diagnosis present

## 2015-02-28 DIAGNOSIS — Z7982 Long term (current) use of aspirin: Secondary | ICD-10-CM | POA: Insufficient documentation

## 2015-02-28 DIAGNOSIS — F17210 Nicotine dependence, cigarettes, uncomplicated: Secondary | ICD-10-CM | POA: Diagnosis not present

## 2015-02-28 DIAGNOSIS — Z87448 Personal history of other diseases of urinary system: Secondary | ICD-10-CM | POA: Diagnosis not present

## 2015-02-28 LAB — CBC
HCT: 44.9 % (ref 36.0–46.0)
HEMOGLOBIN: 14.8 g/dL (ref 12.0–15.0)
MCH: 32 pg (ref 26.0–34.0)
MCHC: 33 g/dL (ref 30.0–36.0)
MCV: 97.2 fL (ref 78.0–100.0)
Platelets: 214 10*3/uL (ref 150–400)
RBC: 4.62 MIL/uL (ref 3.87–5.11)
RDW: 15.3 % (ref 11.5–15.5)
WBC: 7.7 10*3/uL (ref 4.0–10.5)

## 2015-02-28 LAB — LIPASE, BLOOD: Lipase: 29 U/L (ref 11–51)

## 2015-02-28 LAB — COMPREHENSIVE METABOLIC PANEL
ALK PHOS: 84 U/L (ref 38–126)
ALT: 15 U/L (ref 14–54)
ANION GAP: 11 (ref 5–15)
AST: 16 U/L (ref 15–41)
Albumin: 3.5 g/dL (ref 3.5–5.0)
BUN: 11 mg/dL (ref 6–20)
CALCIUM: 9.3 mg/dL (ref 8.9–10.3)
CO2: 26 mmol/L (ref 22–32)
Chloride: 103 mmol/L (ref 101–111)
Creatinine, Ser: 0.72 mg/dL (ref 0.44–1.00)
GFR calc non Af Amer: >60 mL/min (ref >60)
GLUCOSE: 110 mg/dL — AB (ref 65–99)
POTASSIUM: 3.3 mmol/L — AB (ref 3.5–5.1)
SODIUM: 140 mmol/L (ref 135–145)
Total Bilirubin: 0.4 mg/dL (ref 0.3–1.2)
Total Protein: 6.6 g/dL (ref 6.5–8.1)

## 2015-02-28 MED ORDER — ONDANSETRON HCL 4 MG/2ML IJ SOLN
4.0000 mg | Freq: Once | INTRAMUSCULAR | Status: AC
  Administered 2015-02-28: 4 mg via INTRAVENOUS
  Filled 2015-02-28: qty 2

## 2015-02-28 MED ORDER — ONDANSETRON HCL 4 MG/2ML IJ SOLN: 4.0000 mg | Freq: Once | INTRAMUSCULAR | Status: DC

## 2015-02-28 MED ORDER — ONDANSETRON 4 MG PO TBDP: 4.0000 mg | ORAL_TABLET | Freq: Once | ORAL | Status: DC | PRN

## 2015-02-28 MED ORDER — HYDROMORPHONE HCL 1 MG/ML IJ SOLN: 1.0000 mg | Freq: Once | INTRAMUSCULAR | Status: DC

## 2015-02-28 MED ORDER — IOHEXOL 300 MG/ML  SOLN
100.0000 mL | Freq: Once | INTRAMUSCULAR | Status: AC | PRN
Start: 2015-02-28 — End: 2015-02-28
  Administered 2015-02-28: 100 mL via INTRAVENOUS

## 2015-02-28 MED ORDER — BARIUM SULFATE 2.1 % PO SUSP
450.0000 mL | Freq: Once | ORAL | Status: AC
  Administered 2015-02-28: 450 mL via ORAL

## 2015-02-28 MED ORDER — GI COCKTAIL ~~LOC~~
30.0000 mL | Freq: Once | ORAL | Status: AC
  Administered 2015-02-28: 30 mL via ORAL
  Filled 2015-02-28: qty 30

## 2015-02-28 MED ORDER — FAMOTIDINE 20 MG PO TABS: 20.0000 mg | ORAL_TABLET | Freq: Two times a day (BID) | ORAL | Status: DC

## 2015-02-28 NOTE — ED Provider Notes (Signed)
CSN: [id number]     Arrival date & time 02/28/15  0745 History   First MD Initiated Contact with Patient 02/28/15 [phone]     Chief Complaint  Patient presents with  . Abdominal Pain     (Consider location/radiation/quality/duration/timing/severity/associated sxs/prior Treatment) HPI Comments: The patient is a 59 year old female, she has a prior history of borderline diabetes, hypertension, hypercholesterolemia, she also has a history of stomach ulcers, treated with proton pump inhibitors and states that that generally is doing better. She reports that since 2 days ago she has been having abdominal discomfort, this is diffuse, burning, associated with watery diarrhea though she states this is a chronic daily problem for her. The diarrhea is chronic, the abdominal pain is not. She has not had any recent evaluations for abdominal pain within the last 12-24 months. She reports no dysuria, she does have increased pain when she eats, she did have some alcohol recently, she states that she drinks "socially". There is no vomiting, no chest pain, no coughing or shortness of breath other than normal. She does not feel bloated or distended.  Patient is a 59 y.o. female presenting with abdominal pain. The history is provided by the patient.  Abdominal Pain   Past Medical History  Diagnosis Date  . Diabetes mellitus   . Hypertension   . Hypercholesteremia   . Renal insufficiency   . GERD (gastroesophageal reflux disease)   . Hiatal hernia    Past Surgical History  Procedure Laterality Date  . Abdominal hysterectomy     Family History  Problem Relation Age of Onset  . Stroke Mother   . Kidney cancer Mother   . Colon cancer Maternal Uncle    Social History  Substance Use Topics  . Smoking status: Current Every Day Smoker -- 1.00 packs/day    Types: Cigarettes  . Smokeless tobacco: Never Used  . Alcohol Use: Yes     Comment: "just socially"   OB History    No data available     Review  of Systems  Gastrointestinal: Positive for abdominal pain.  All other systems reviewed and are negative.     Allergies  Review of patient's allergies indicates no known allergies.  Home Medications   Prior to Admission medications   Medication Sig Start Date End Date Taking? Authorizing Provider  aspirin EC 81 MG tablet Take 81 mg by mouth daily.     Yes Historical Provider, MD  clonazePAM (KLONOPIN) 1 MG tablet Take 1 mg by mouth 2 (two) times daily.     Yes Historical Provider, MD  loperamide (IMODIUM A-D) 2 MG tablet As instructed for diarrhea 11/28/13  Yes Delfina Redwood, MD  metoprolol succinate (TOPROL-XL) 100 MG 24 hr tablet Take 100 mg by mouth daily. 02/02/15  Yes Historical Provider, MD  zolpidem (AMBIEN) 10 MG tablet Take 5 mg by mouth at bedtime as needed for sleep. For insomnia/anxiety   Yes Historical Provider, MD  famotidine (PEPCID) 20 MG tablet Take 1 tablet (20 mg total) by mouth 2 (two) times daily. 02/28/15   Noemi Chapel, MD   BP 143/98 mmHg  Pulse 73  Temp(Src) 97.9 F (36.6 C) (Oral)  Ht 5\' 4"  (1.626 m)  Wt 135 lb (61.236 kg)  BMI 23.16 kg/m2  SpO2 97% Physical Exam  Constitutional: She appears well-developed and well-nourished. No distress.  HENT:  Head: Normocephalic and atraumatic.  Mouth/Throat: Oropharynx is clear and moist. No oropharyngeal exudate.  Eyes: Conjunctivae and EOM are normal. Pupils are  equal, round, and reactive to light. Right eye exhibits no discharge. Left eye exhibits no discharge. No scleral icterus.  Neck: Normal range of motion. Neck supple. No JVD present. No thyromegaly present.  Cardiovascular: Normal rate, regular rhythm, normal heart sounds and intact distal pulses.  Exam reveals no gallop and no friction rub.   No murmur heard. Pulmonary/Chest: Effort normal and breath sounds normal. No respiratory distress. She has no wheezes. She has no rales.  Abdominal: Soft. Bowel sounds are normal. She exhibits no distension and no  mass. There is tenderness ( Diffuse abdominal tenderness to palpation, no guarding, no peritoneal signs, increased pain in the right upper and left upper and epigastrium.).  Musculoskeletal: Normal range of motion. She exhibits no edema or tenderness.  Lymphadenopathy:    She has no cervical adenopathy.  Neurological: She is alert. Coordination normal.  Skin: Skin is warm and dry. No rash noted. No erythema.  Psychiatric: She has a normal mood and affect. Her behavior is normal.  Nursing note and vitals reviewed.   ED Course  Procedures (including critical care time) Labs Review Labs Reviewed  COMPREHENSIVE METABOLIC PANEL - Abnormal; Notable for the following:    Potassium 3.3 (*)    Glucose, Bld 110 (*)    All other components within normal limits  LIPASE, BLOOD  CBC  URINALYSIS, ROUTINE W REFLEX MICROSCOPIC (NOT AT [facility])    Imaging Review Ct Abdomen Pelvis W Contrast  02/28/2015  CLINICAL DATA:  Epigastric pain starting Saturday, nausea. EXAM: CT ABDOMEN AND PELVIS WITH CONTRAST TECHNIQUE: Multidetector CT imaging of the abdomen and pelvis was performed using the standard protocol following bolus administration of intravenous contrast. CONTRAST:  158mL OMNIPAQUE IOHEXOL 300 MG/ML  SOLN COMPARISON:  11/26/2012. FINDINGS: Lower chest: Lung bases show no acute findings. Heart size normal. No pericardial or pleural effusion. Tiny hiatal hernia. Hepatobiliary: Liver and gallbladder are unremarkable. No biliary ductal dilatation. Pancreas: Negative. Spleen: Negative. Adrenals/Urinary Tract: Right adrenal gland is unremarkable. 1.6 cm left adrenal nodule has a relative washout of 43% and is stable in size from 11/26/2012. Multiple scattered low attenuation lesions in the kidneys measure up to 11 mm on the left and are likely cysts, statistically. Ureters are decompressed. Bladder is unremarkable. Stomach/Bowel: Tiny hiatal hernia. Distal gastric wall appears slightly thickened but this area is  under distended. Stomach, small bowel and colon are otherwise unremarkable. Appendix is not readily visualized. Vascular/Lymphatic: Scattered atherosclerotic calcification of the arterial vasculature without abdominal aortic aneurysm. No pathologically enlarged lymph nodes. Porta hepatis lymph nodes measure up to 10 mm, unchanged. Reproductive: Hysterectomy.  Ovaries are visualized. Other: Trace pelvic free fluid. Mesenteries and peritoneum are otherwise unremarkable. Musculoskeletal: No worrisome lytic or sclerotic lesions. Degenerative changes are seen in the spine. IMPRESSION: 1. Possible wall thickening involving the distal stomach. This area is under distended, however. Gastritis or peptic ulcer disease cannot be excluded. 2. Left adrenal adenoma. Electronically Signed   By: Lorin Picket M.D.   On: 02/28/2015 11:41   I have personally reviewed and evaluated these images and lab results as part of my medical decision-making.    MDM   Final diagnoses:  Generalized abdominal pain    The patient's vital signs show hypertension but no other acute findings, her abdominal exam is significant for upper abdominal tenderness, would consider pancreatitis, cholecystitis, peptic ulcer disease in the differential. We'll obtain labs, GI cocktail, pain medications, imaging will be decided upon after labs have resulted.  CT shows ? Thickened stomach -  likley c/w PUD - pain resolved with GI cocktail - labs reassuring.  Meds given in ED:  Medications  HYDROmorphone (DILAUDID) injection 1 mg (0 mg Intravenous Hold 02/28/15 0830)  ondansetron (ZOFRAN) injection 4 mg (0 mg Intravenous Hold 02/28/15 0830)  ondansetron (ZOFRAN) injection 4 mg (4 mg Intravenous Given 02/28/15 0806)  gi cocktail (Maalox,Lidocaine,Donnatal) (30 mLs Oral Given 02/28/15 0815)  Barium Sulfate 2.1 % SUSP 450 mL (450 mLs Oral Given 02/28/15 1024)  iohexol (OMNIPAQUE) 300 MG/ML solution 100 mL (100 mLs Intravenous Contrast Given  02/28/15 1108)    New Prescriptions   FAMOTIDINE (PEPCID) 20 MG TABLET    Take 1 tablet (20 mg total) by mouth 2 (two) times daily.       Noemi Chapel, MD 02/28/15 1314

## 2015-02-28 NOTE — ED Notes (Signed)
Pt still unable to urinate 

## 2015-02-28 NOTE — Discharge Instructions (Signed)

## 2015-02-28 NOTE — ED Notes (Addendum)
Pt from home with c/o generalized abdominal burning and cramping since this past Sat with nausea and radiation to lower back.  Denies emesis.  Reports diarrhea x years, none in the last 24 hours.  Pt states the pain woke her from her sleep at 2 am today. Hx of ulcer.  NAD, A&O.

## 2015-04-03 DIAGNOSIS — S82209A Unspecified fracture of shaft of unspecified tibia, initial encounter for closed fracture: Secondary | ICD-10-CM

## 2015-04-03 HISTORY — DX: Unspecified fracture of shaft of unspecified tibia, initial encounter for closed fracture: S82.209A

## 2015-04-11 ENCOUNTER — Encounter (HOSPITAL_COMMUNITY): Payer: Self-pay | Admitting: *Deleted

## 2015-04-11 ENCOUNTER — Emergency Department (HOSPITAL_COMMUNITY): Payer: BC Managed Care – PPO

## 2015-04-11 ENCOUNTER — Inpatient Hospital Stay (HOSPITAL_COMMUNITY)
Admission: EM | Admit: 2015-04-11 | Discharge: 2015-04-14 | DRG: 378 | Disposition: A | Discharge status: Home / Self Care | Payer: BC Managed Care – PPO | Attending: Internal Medicine | Admitting: Internal Medicine

## 2015-04-11 DIAGNOSIS — W19XXXA Unspecified fall, initial encounter: Secondary | ICD-10-CM | POA: Diagnosis present

## 2015-04-11 DIAGNOSIS — K922 Gastrointestinal hemorrhage, unspecified: Secondary | ICD-10-CM | POA: Diagnosis present

## 2015-04-11 DIAGNOSIS — K210 Gastro-esophageal reflux disease with esophagitis: Secondary | ICD-10-CM | POA: Diagnosis present

## 2015-04-11 DIAGNOSIS — Z800 Family history of malignant neoplasm of digestive organs: Secondary | ICD-10-CM

## 2015-04-11 DIAGNOSIS — K920 Hematemesis: Secondary | ICD-10-CM | POA: Diagnosis present

## 2015-04-11 DIAGNOSIS — R112 Nausea with vomiting, unspecified: Secondary | ICD-10-CM | POA: Diagnosis not present

## 2015-04-11 DIAGNOSIS — I10 Essential (primary) hypertension: Secondary | ICD-10-CM | POA: Diagnosis present

## 2015-04-11 DIAGNOSIS — Z7982 Long term (current) use of aspirin: Secondary | ICD-10-CM | POA: Diagnosis not present

## 2015-04-11 DIAGNOSIS — E7800 Pure hypercholesterolemia, unspecified: Secondary | ICD-10-CM | POA: Diagnosis present

## 2015-04-11 DIAGNOSIS — S82402A Unspecified fracture of shaft of left fibula, initial encounter for closed fracture: Secondary | ICD-10-CM

## 2015-04-11 DIAGNOSIS — K580 Irritable bowel syndrome with diarrhea: Secondary | ICD-10-CM | POA: Diagnosis present

## 2015-04-11 DIAGNOSIS — Z823 Family history of stroke: Secondary | ICD-10-CM

## 2015-04-11 DIAGNOSIS — S82832A Other fracture of upper and lower end of left fibula, initial encounter for closed fracture: Secondary | ICD-10-CM | POA: Diagnosis present

## 2015-04-11 DIAGNOSIS — R110 Nausea: Secondary | ICD-10-CM | POA: Diagnosis not present

## 2015-04-11 DIAGNOSIS — F419 Anxiety disorder, unspecified: Secondary | ICD-10-CM | POA: Diagnosis present

## 2015-04-11 DIAGNOSIS — E119 Type 2 diabetes mellitus without complications: Secondary | ICD-10-CM | POA: Diagnosis present

## 2015-04-11 DIAGNOSIS — F17210 Nicotine dependence, cigarettes, uncomplicated: Secondary | ICD-10-CM | POA: Diagnosis present

## 2015-04-11 DIAGNOSIS — Z79899 Other long term (current) drug therapy: Secondary | ICD-10-CM

## 2015-04-11 DIAGNOSIS — K250 Acute gastric ulcer with hemorrhage: Principal | ICD-10-CM | POA: Diagnosis present

## 2015-04-11 DIAGNOSIS — Z8711 Personal history of peptic ulcer disease: Secondary | ICD-10-CM

## 2015-04-11 DIAGNOSIS — K279 Peptic ulcer, site unspecified, unspecified as acute or chronic, without hemorrhage or perforation: Secondary | ICD-10-CM

## 2015-04-11 DIAGNOSIS — E43 Unspecified severe protein-calorie malnutrition: Secondary | ICD-10-CM | POA: Diagnosis present

## 2015-04-11 DIAGNOSIS — F17200 Nicotine dependence, unspecified, uncomplicated: Secondary | ICD-10-CM | POA: Diagnosis present

## 2015-04-11 DIAGNOSIS — D62 Acute posthemorrhagic anemia: Secondary | ICD-10-CM | POA: Diagnosis present

## 2015-04-11 DIAGNOSIS — R55 Syncope and collapse: Secondary | ICD-10-CM

## 2015-04-11 DIAGNOSIS — S82402D Unspecified fracture of shaft of left fibula, subsequent encounter for closed fracture with routine healing: Secondary | ICD-10-CM | POA: Diagnosis not present

## 2015-04-11 DIAGNOSIS — S82892A Other fracture of left lower leg, initial encounter for closed fracture: Secondary | ICD-10-CM | POA: Diagnosis present

## 2015-04-11 DIAGNOSIS — K254 Chronic or unspecified gastric ulcer with hemorrhage: Secondary | ICD-10-CM | POA: Diagnosis not present

## 2015-04-11 DIAGNOSIS — K253 Acute gastric ulcer without hemorrhage or perforation: Secondary | ICD-10-CM | POA: Diagnosis not present

## 2015-04-11 DIAGNOSIS — R1110 Vomiting, unspecified: Secondary | ICD-10-CM | POA: Diagnosis present

## 2015-04-11 HISTORY — DX: Unspecified severe protein-calorie malnutrition: E43

## 2015-04-11 HISTORY — DX: Benign neoplasm of unspecified adrenal gland: D35.00

## 2015-04-11 HISTORY — DX: Nonrheumatic mitral (valve) insufficiency: I34.0

## 2015-04-11 HISTORY — DX: Irritable bowel syndrome without diarrhea: K58.9

## 2015-04-11 HISTORY — DX: Anemia, unspecified: D64.9

## 2015-04-11 HISTORY — DX: Hyperlipidemia, unspecified: E78.5

## 2015-04-11 HISTORY — DX: Duodenitis without bleeding: K29.80

## 2015-04-11 HISTORY — DX: Unspecified fracture of shaft of unspecified tibia, initial encounter for closed fracture: S82.209A

## 2015-04-11 HISTORY — DX: Other specified anxiety disorders: F41.8

## 2015-04-11 HISTORY — DX: Aphasia: R47.01

## 2015-04-11 HISTORY — DX: Unspecified hydronephrosis: N13.30

## 2015-04-11 HISTORY — DX: Unspecified fracture of shaft of unspecified fibula, initial encounter for closed fracture: S82.409A

## 2015-04-11 HISTORY — DX: Gastric ulcer, unspecified as acute or chronic, without hemorrhage or perforation: K25.9

## 2015-04-11 LAB — HEPATIC FUNCTION PANEL
ALBUMIN: 3 g/dL — AB (ref 3.5–5.0)
ALK PHOS: 72 U/L (ref 38–126)
ALT: 9 U/L — AB (ref 14–54)
AST: 14 U/L — AB (ref 15–41)
BILIRUBIN TOTAL: 0.4 mg/dL (ref 0.3–1.2)
Bilirubin, Direct: 0.2 mg/dL (ref 0.1–0.5)
Indirect Bilirubin: 0.2 mg/dL — ABNORMAL LOW (ref 0.3–0.9)
Total Protein: 6.1 g/dL — ABNORMAL LOW (ref 6.5–8.1)

## 2015-04-11 LAB — URINALYSIS, ROUTINE W REFLEX MICROSCOPIC
GLUCOSE, UA: NEGATIVE mg/dL
Hgb urine dipstick: NEGATIVE
KETONES UR: NEGATIVE mg/dL
LEUKOCYTES UA: NEGATIVE
Nitrite: NEGATIVE
PH: 6 (ref 5.0–8.0)
Protein, ur: 30 mg/dL — AB
Specific Gravity, Urine: 1.024 (ref 1.005–1.030)

## 2015-04-11 LAB — BASIC METABOLIC PANEL
Anion gap: 13 (ref 5–15)
BUN: 15 mg/dL (ref 6–20)
CHLORIDE: 103 mmol/L (ref 101–111)
CO2: 27 mmol/L (ref 22–32)
Calcium: 8.8 mg/dL — ABNORMAL LOW (ref 8.9–10.3)
Creatinine, Ser: 0.83 mg/dL (ref 0.44–1.00)
GFR calc Af Amer: >60 mL/min (ref >60)
GFR calc non Af Amer: >60 mL/min (ref >60)
GLUCOSE: 141 mg/dL — AB (ref 65–99)
POTASSIUM: 3.5 mmol/L (ref 3.5–5.1)
SODIUM: 143 mmol/L (ref 135–145)

## 2015-04-11 LAB — CBC
HEMATOCRIT: 37 % (ref 36.0–46.0)
Hemoglobin: 12.2 g/dL (ref 12.0–15.0)
MCH: 32.4 pg (ref 26.0–34.0)
MCHC: 33 g/dL (ref 30.0–36.0)
MCV: 98.1 fL (ref 78.0–100.0)
Platelets: 278 10*3/uL (ref 150–400)
RBC: 3.77 MIL/uL — ABNORMAL LOW (ref 3.87–5.11)
RDW: 13.8 % (ref 11.5–15.5)
WBC: 12.6 10*3/uL — AB (ref 4.0–10.5)

## 2015-04-11 LAB — URINE MICROSCOPIC-ADD ON

## 2015-04-11 LAB — PROTIME-INR
INR: 1.06 (ref 0.00–1.49)
Prothrombin Time: 14 seconds (ref 11.6–15.2)

## 2015-04-11 LAB — ABO/RH: ABO/RH(D): A POS

## 2015-04-11 LAB — CBG MONITORING, ED: Glucose-Capillary: 116 mg/dL — ABNORMAL HIGH (ref 65–99)

## 2015-04-11 LAB — LIPASE, BLOOD: LIPASE: 20 U/L (ref 11–51)

## 2015-04-11 LAB — TYPE AND SCREEN
ABO/RH(D): A POS
Antibody Screen: NEGATIVE

## 2015-04-11 LAB — TROPONIN I

## 2015-04-11 LAB — POC OCCULT BLOOD, ED: FECAL OCCULT BLD: POSITIVE — AB

## 2015-04-11 MED ORDER — PANTOPRAZOLE SODIUM 40 MG IV SOLR
80.0000 mg | Freq: Once | INTRAVENOUS | Status: AC
  Administered 2015-04-11: 80 mg via INTRAVENOUS
  Filled 2015-04-11: qty 80

## 2015-04-11 MED ORDER — FAMOTIDINE 20 MG PO TABS
20.0000 mg | ORAL_TABLET | Freq: Two times a day (BID) | ORAL | Status: DC
  Administered 2015-04-12: 20 mg via ORAL
  Filled 2015-04-11 (×2): qty 1

## 2015-04-11 MED ORDER — SODIUM CHLORIDE 0.9 % IV BOLUS (SEPSIS)
1000.0000 mL | Freq: Once | INTRAVENOUS | Status: AC
  Administered 2015-04-11: 1000 mL via INTRAVENOUS

## 2015-04-11 MED ORDER — SODIUM CHLORIDE 0.9 % IV SOLN
INTRAVENOUS | Status: DC
  Administered 2015-04-12 – 2015-04-13 (×3): via INTRAVENOUS

## 2015-04-11 MED ORDER — METOPROLOL SUCCINATE ER 100 MG PO TB24
100.0000 mg | ORAL_TABLET | Freq: Every day | ORAL | Status: DC
  Administered 2015-04-12: 100 mg via ORAL
  Filled 2015-04-11: qty 1

## 2015-04-11 MED ORDER — OXYCODONE HCL 5 MG PO TABS
5.0000 mg | ORAL_TABLET | ORAL | Status: DC | PRN
  Administered 2015-04-12 (×2): 5 mg via ORAL
  Filled 2015-04-11 (×4): qty 1

## 2015-04-11 MED ORDER — ONDANSETRON HCL 4 MG/2ML IJ SOLN
4.0000 mg | Freq: Once | INTRAMUSCULAR | Status: AC
  Administered 2015-04-11: 4 mg via INTRAVENOUS
  Filled 2015-04-11: qty 2

## 2015-04-11 MED ORDER — PANTOPRAZOLE SODIUM 40 MG IV SOLR
40.0000 mg | Freq: Once | INTRAVENOUS | Status: AC
  Administered 2015-04-11: 40 mg via INTRAVENOUS
  Filled 2015-04-11: qty 40

## 2015-04-11 MED ORDER — LISINOPRIL 10 MG PO TABS
10.0000 mg | ORAL_TABLET | Freq: Every day | ORAL | Status: DC
  Administered 2015-04-12: 10 mg via ORAL
  Filled 2015-04-11: qty 1

## 2015-04-11 MED ORDER — MORPHINE SULFATE (PF) 4 MG/ML IV SOLN
4.0000 mg | Freq: Once | INTRAVENOUS | Status: AC
  Administered 2015-04-11: 4 mg via INTRAVENOUS
  Filled 2015-04-11: qty 1

## 2015-04-11 MED ORDER — CLONAZEPAM 0.5 MG PO TABS
0.5000 mg | ORAL_TABLET | Freq: Two times a day (BID) | ORAL | Status: DC
  Administered 2015-04-12 – 2015-04-14 (×6): 0.5 mg via ORAL
  Filled 2015-04-11 (×6): qty 1

## 2015-04-11 MED ORDER — PANTOPRAZOLE SODIUM 40 MG IV SOLR
40.0000 mg | Freq: Two times a day (BID) | INTRAVENOUS | Status: DC
  Administered 2015-04-12 – 2015-04-13 (×3): 40 mg via INTRAVENOUS
  Filled 2015-04-11 (×3): qty 40

## 2015-04-11 MED ORDER — MORPHINE SULFATE (PF) 2 MG/ML IV SOLN
2.0000 mg | INTRAVENOUS | Status: DC | PRN
  Administered 2015-04-11 – 2015-04-12 (×4): 2 mg via INTRAVENOUS
  Filled 2015-04-11 (×4): qty 1

## 2015-04-11 NOTE — H&P (Signed)
[facility] History and Physical  Krista Carter [id number] DOB: [birth year] DOA: 04/11/2015  Referring physician:  PCP: Donnie Coffin, MD  Specialists:   Chief Complaint: syncope, GI bleeding   HPI: IISHA Carter is a 60 y.o. female with PMH of HTN, DM, PUD presented with syncope.  Pt reports having chronic GI symptoms with nausea, and intermittent diarrhea for a long time. She started having more frequent diarrhea, associated with epigastric abdominal pains, nausea, and vomiting for 2 weeks. Today, she felt more nauseated, vomited bright red blood developed dizziness, and developed syncope. No witnessed seizure, no tongue bite or loss of control of urination. She denies focal weaknesses, no acute chest pains, no SOB.  -ED: Pt noted to have melena, hemodynamically stable.  Hg is 12.2. ED d/w gastroenterology. hospitalist is asked for admission. Also L fibula fracture. Ortho is evaluating    Review of Systems: The patient denies anorexia, fever, weight loss,, vision loss, decreased hearing, hoarseness, chest pain, syncope, dyspnea on exertion, peripheral edema, balance deficits, hemoptysis, abdominal pain, melena, hematochezia, severe indigestion/heartburn, hematuria, incontinence, genital sores, muscle weakness, suspicious skin lesions, transient blindness, difficulty walking, depression, unusual weight change, abnormal bleeding, enlarged lymph nodes, angioedema, and breast masses.    Past Medical History  Diagnosis Date  . Diabetes mellitus   . Hypertension   . Hypercholesteremia   . Renal insufficiency   . GERD (gastroesophageal reflux disease)   . Hiatal hernia    Past Surgical History  Procedure Laterality Date  . Abdominal hysterectomy     Social History:  reports that she has been smoking Cigarettes.  She has been smoking about 1.00 pack per day. She has never used smokeless tobacco. She reports that she drinks alcohol. She reports that she does not use illicit drugs. Home;   where does patient live--home, ALF, SNF? and with whom if at home? Yes;  Can patient participate in ADLs?  No Known Allergies  Family History  Problem Relation Age of Onset  . Stroke Mother   . Kidney cancer Mother   . Colon cancer Maternal Uncle     (be sure to complete)  Prior to Admission medications   Medication Sig Start Date End Date Taking? Authorizing Provider  aspirin EC 81 MG tablet Take 81 mg by mouth daily.     Yes Historical Provider, MD  clonazePAM (KLONOPIN) 0.5 MG tablet Take 0.5 mg by mouth 2 (two) times daily as needed. Anxiety 02/01/15  Yes Historical Provider, MD  lisinopril (PRINIVIL,ZESTRIL) 10 MG tablet Take 10 mg by mouth daily.   Yes Historical Provider, MD  metoprolol succinate (TOPROL-XL) 100 MG 24 hr tablet Take 100 mg by mouth daily. 02/02/15  Yes Historical Provider, MD  pantoprazole (PROTONIX) 40 MG tablet Take 40 mg by mouth daily.   Yes Historical Provider, MD  famotidine (PEPCID) 20 MG tablet Take 1 tablet (20 mg total) by mouth 2 (two) times daily. Patient not taking: Reported on 04/11/2015 02/28/15   Noemi Chapel, MD  loperamide (IMODIUM A-D) 2 MG tablet As instructed for diarrhea Patient not taking: Reported on 04/11/2015 11/28/13   Delfina Redwood, MD   Physical Exam: Filed Vitals:   04/11/15 1530 04/11/15 1613  BP: 117/77 139/85  Pulse:  95  Temp:  98.5 F (36.9 C)  Resp: 18 18     General:  Alert. No distress   Eyes: eom-i  ENT: no oral ulcers   Neck: supple, no JVD   Cardiovascular: s1,s2 rrr  Respiratory: CTA  BL  Abdomen: soft,  Mild epigastric tenderness. No rebound   Skin: no rash   Musculoskeletal: no pedal edema. L leg tender due to trauma   Psychiatric: no hallucinations,.   Neurologic: CN 2-12 intatc. L leg ROM limited due to pain  Labs on Admission:  Basic Metabolic Panel:  Recent Labs Lab 04/11/15 1347  NA 143  K 3.5  CL 103  CO2 27  GLUCOSE 141*  BUN 15  CREATININE 0.83  CALCIUM 8.8*   Liver  Function Tests:  Recent Labs Lab 04/11/15 1512  AST 14*  ALT 9*  ALKPHOS 72  BILITOT 0.4  PROT 6.1*  ALBUMIN 3.0*    Recent Labs Lab 04/11/15 1512  LIPASE 20   No results for input(s): AMMONIA in the last 168 hours. CBC:  Recent Labs Lab 04/11/15 1347  WBC 12.6*  HGB 12.2  HCT 37.0  MCV 98.1  PLT 278   Cardiac Enzymes:  Recent Labs Lab 04/11/15 1512  TROPONINI <0.03    BNP (last 3 results) No results for input(s): BNP in the last 8760 hours.  ProBNP (last 3 results) No results for input(s): PROBNP in the last 8760 hours.  CBG:  Recent Labs Lab 04/11/15 1402  GLUCAP 116*    Radiological Exams on Admission: Dg Chest 2 View  04/11/2015  CLINICAL DATA:  Syncope, left leg injury EXAM: CHEST  2 VIEW COMPARISON:  11/26/2013 FINDINGS: Cardiomediastinal silhouette is stable. No acute infiltrate or pleural effusion. No pulmonary edema. Metallic fixation plate and screws noted in right humerus. IMPRESSION: No active cardiopulmonary disease. Electronically Signed   By: Lahoma Crocker M.D.   On: 04/11/2015 15:56   Dg Tibia/fibula Left  04/11/2015  CLINICAL DATA:  Fall this morning, left lower leg pain EXAM: LEFT TIBIA AND FIBULA - 2 VIEW COMPARISON:  None. FINDINGS: Three views of the left tibia fibula submitted. Degenerative changes are noted left knee joint with significant narrowing of medial joint compartment and spurring of medial femoral condyle and medial tibial plateau. There is mild displaced oblique fracture in proximal shaft of left fibula best seen on lateral view. IMPRESSION: Mild displaced oblique fracture proximal shaft of left fibula best seen on lateral view. Extensive degenerative changes left knee joint. Electronically Signed   By: Lahoma Crocker M.D.   On: 04/11/2015 15:55   Dg Ankle Complete Left  04/11/2015  CLINICAL DATA:  Status post fall.  Pain. EXAM: LEFT ANKLE COMPLETE - 3+ VIEW COMPARISON:  None. FINDINGS: There is a small ossific fragment adjacent to  the lateral aspect of the distal tibial epiphysis at the distal tibiofibular joint concerning for a small fractured of indeterminate age. There is a small plantar calcaneal spur. Soft tissues are unremarkable. IMPRESSION: 1. Small ossific fragment adjacent to the lateral aspect of the distal tibial epiphysis at the distal tibiofibular joint concerning for a small fractured of indeterminate age. Electronically Signed   By: Kathreen Devoid   On: 04/11/2015 15:59    EKG: Independently reviewed.   Assessment/Plan Active Problems:   Syncope   GI bleeding   60 y.o. female with PMH of HTN, DM, Anxiety, PUD presented with syncope and GI bleeding. Also found to have L fibula fracture   1. GI bleeding. H/oPUD. Pt is hemodynamically stable. Hg 12.5. ? UGIB, but bun/cr is unremarkable.  -EGD (10/2013): persistent bouts smaller gastric ulcers in prepyloric with no stigmata of bleeding Distal esophagitis. And Colonoscopy (09/2013): normal  -we will cont monitor for bleeding, serial Hg.  TF PRBCs  prn. start IV PPI. IVF as needed. Consulted GI from ED.   2. Syncope in the setting of GI bleeding. Suspected  Element of vasovagal/vomiting related to syncope. Unlikely blood loss related syncope, since hemodynamically stable and Hg 12.2.  -we will cont to monitor. TF blood as needed. Resume Benzo to prevent withdrawals seizures  3. Fall, L proximal fibula fracture. Per ortho: Pt will need further imaging as outpatient in 1 week. NWB to L leg. Cont pain control  4. HTN. Resume BB. Monitor  5. Anxiety. Pt is dependant on benzo-chronic use. Will restart clonopin to prevent withdrawals    Ortho, GI.  if consultant consulted, please document name and whether formally or informally consulted  Code Status: full (must indicate code status--if unknown or must be presumed, indicate so) Family Communication: d/w patient, his son  (indicate person spoken with, if applicable, with phone number if by telephone) Disposition  Plan: pend further evaluation for GI bleeding  (indicate anticipated LOS)  Time spent: >45 minutes   Kinnie Feil [facility] Pager [phone]  If 7PM-7AM, please contact night-coverage [email or web address] Password [facility] 04/11/2015, 4:57 PM

## 2015-04-11 NOTE — ED Notes (Addendum)
Patient comes from home with a complaint of weakness, lightheadedness, and other multiple complaints. Patient also had blood emesis today with a history of gerd and ulcers. Patient use to see Dr. Olevia Perches but has been out of medications for a year due to financial hardship.

## 2015-04-11 NOTE — ED Notes (Signed)
Pt doses not want orthostatic vs at this time MD made aware

## 2015-04-11 NOTE — ED Provider Notes (Addendum)
CSN: [id number]     Arrival date & time 04/11/15  1250 History   First MD Initiated Contact with Patient 04/11/15 1424     Chief Complaint  Patient presents with  . Hemoptysis  . Near Syncope     (Consider location/radiation/quality/duration/timing/severity/associated sxs/prior Treatment) Patient is a 60 y.o. female presenting with near-syncope. The history is provided by the patient.  Near Syncope Pertinent negatives include no chest pain, no abdominal pain, no headaches and no shortness of breath.  Patient w hx gerd, pud, presents c/o recent nvd, and states had syncopal event today.  Pt indicates recent watery stools, a few times per day. No bloody bms.  No recent abx use, travel, known bad food ingestion or ill contacts. When vomited today noted bright red blood. Hx same. Hx pud.  States has been out of meds for weeks/months, due to inability to afford. Epigastric pain, dull, moderate, occasionally moves up to lower chest. No exertional chest pain or discomfort. No cough or uri c/o. No fever or chills. w syncope, denies palpitations.  No hx dysrhythmia.  Pt indicates w syncope/fall, injured left lower leg and ankle. No neck or back pain. No headache.      Past Medical History  Diagnosis Date  . Diabetes mellitus   . Hypertension   . Hypercholesteremia   . Renal insufficiency   . GERD (gastroesophageal reflux disease)   . Hiatal hernia    Past Surgical History  Procedure Laterality Date  . Abdominal hysterectomy     Family History  Problem Relation Age of Onset  . Stroke Mother   . Kidney cancer Mother   . Colon cancer Maternal Uncle    Social History  Substance Use Topics  . Smoking status: Current Every Day Smoker -- 1.00 packs/day    Types: Cigarettes  . Smokeless tobacco: Never Used  . Alcohol Use: Yes     Comment: "just socially"   OB History    No data available     Review of Systems  Constitutional: Negative for fever and chills.  HENT: Negative for sore  throat.   Eyes: Negative for redness.  Respiratory: Negative for cough and shortness of breath.   Cardiovascular: Positive for near-syncope. Negative for chest pain.  Gastrointestinal: Positive for vomiting and diarrhea. Negative for abdominal pain.  Genitourinary: Negative for hematuria, flank pain and vaginal bleeding.  Musculoskeletal: Negative for back pain and neck pain.  Skin: Negative for rash.  Neurological: Negative for headaches.  Hematological: Does not bruise/bleed easily.  Psychiatric/Behavioral: Negative for confusion.      Allergies  Review of patient's allergies indicates no known allergies.  Home Medications   Prior to Admission medications   Medication Sig Start Date End Date Taking? Authorizing Provider  aspirin EC 81 MG tablet Take 81 mg by mouth daily.     Yes Historical Provider, MD  clonazePAM (KLONOPIN) 0.5 MG tablet Take 0.5 mg by mouth 2 (two) times daily as needed. Anxiety 02/01/15  Yes Historical Provider, MD  lisinopril (PRINIVIL,ZESTRIL) 10 MG tablet Take 10 mg by mouth daily.   Yes Historical Provider, MD  metoprolol succinate (TOPROL-XL) 100 MG 24 hr tablet Take 100 mg by mouth daily. 02/02/15  Yes Historical Provider, MD  pantoprazole (PROTONIX) 40 MG tablet Take 40 mg by mouth daily.   Yes Historical Provider, MD  famotidine (PEPCID) 20 MG tablet Take 1 tablet (20 mg total) by mouth 2 (two) times daily. Patient not taking: Reported on 04/11/2015 02/28/15   Aaron Edelman  Sabra Heck, MD  loperamide (IMODIUM A-D) 2 MG tablet As instructed for diarrhea Patient not taking: Reported on 04/11/2015 11/28/13   Delfina Redwood, MD   BP 97/72 mmHg  Pulse 89  Temp(Src) 97.9 F (36.6 C) (Oral)  Resp 17  SpO2 100% Physical Exam  Constitutional: She is oriented to person, place, and time. She appears well-developed and well-nourished. No distress.  HENT:  Head: Atraumatic.  Mouth/Throat: Oropharynx is clear and moist.  Eyes: Conjunctivae are normal. No scleral icterus.   Neck: Normal range of motion. Neck supple. No tracheal deviation present.  Cardiovascular: Normal rate, regular rhythm, normal heart sounds and intact distal pulses.  Exam reveals no gallop and no friction rub.   No murmur heard. Pulmonary/Chest: Effort normal and breath sounds normal. No respiratory distress.  Abdominal: Soft. Normal appearance and bowel sounds are normal. She exhibits no distension. There is no tenderness.  Genitourinary:  No cva tenderness. Dark maroon stool on rectal exam, heme pos  Musculoskeletal: She exhibits no edema.  CTLS spine, non tender, aligned, no step off. Tenderness left prox to mid tibfib and left ankle.  Distal pulses palp. Compartments soft, not tense, no signif sts.   Neurological: She is alert and oriented to person, place, and time.  Motor intact bil, stre 5/5. sens grossly intact.   Skin: Skin is warm and dry. No rash noted. She is not diaphoretic.  Psychiatric: She has a normal mood and affect.  Nursing note and vitals reviewed.   ED Course  Procedures (including critical care time) Labs Review   Results for orders placed or performed during the hospital encounter of [id number]  Basic metabolic panel  Result Value Ref Range   Sodium 143 135 - 145 mmol/L   Potassium 3.5 3.5 - 5.1 mmol/L   Chloride 103 101 - 111 mmol/L   CO2 27 22 - 32 mmol/L   Glucose, Bld 141 (H) 65 - 99 mg/dL   BUN 15 6 - 20 mg/dL   Creatinine, Ser 0.83 0.44 - 1.00 mg/dL   Calcium 8.8 (L) 8.9 - 10.3 mg/dL   GFR calc non Af Amer >60 >60 mL/min   GFR calc Af Amer >60 >60 mL/min   Anion gap 13 5 - 15  CBC  Result Value Ref Range   WBC 12.6 (H) 4.0 - 10.5 K/uL   RBC 3.77 (L) 3.87 - 5.11 MIL/uL   Hemoglobin 12.2 12.0 - 15.0 g/dL   HCT 37.0 36.0 - 46.0 %   MCV 98.1 78.0 - 100.0 fL   MCH 32.4 26.0 - 34.0 pg   MCHC 33.0 30.0 - 36.0 g/dL   RDW 13.8 11.5 - 15.5 %   Platelets 278 150 - 400 K/uL  Urinalysis, Routine w reflex microscopic (not at [facility])  Result Value Ref  Range   Color, Urine AMBER (A) YELLOW   APPearance CLOUDY (A) CLEAR   Specific Gravity, Urine 1.024 1.005 - 1.030   pH 6.0 5.0 - 8.0   Glucose, UA NEGATIVE NEGATIVE mg/dL   Hgb urine dipstick NEGATIVE NEGATIVE   Bilirubin Urine LARGE (A) NEGATIVE   Ketones, ur NEGATIVE NEGATIVE mg/dL   Protein, ur 30 (A) NEGATIVE mg/dL   Nitrite NEGATIVE NEGATIVE   Leukocytes, UA NEGATIVE NEGATIVE  Hepatic function panel  Result Value Ref Range   Total Protein 6.1 (L) 6.5 - 8.1 g/dL   Albumin 3.0 (L) 3.5 - 5.0 g/dL   AST 14 (L) 15 - 41 U/L   ALT 9 (L) 14 -  54 U/L   Alkaline Phosphatase 72 38 - 126 U/L   Total Bilirubin 0.4 0.3 - 1.2 mg/dL   Bilirubin, Direct 0.2 0.1 - 0.5 mg/dL   Indirect Bilirubin 0.2 (L) 0.3 - 0.9 mg/dL  Protime-INR  Result Value Ref Range   Prothrombin Time 14.0 11.6 - 15.2 seconds   INR 1.06 0.00 - 1.49  Troponin I  Result Value Ref Range   Troponin I <0.03 <0.031 ng/mL  Lipase, blood  Result Value Ref Range   Lipase 20 11 - 51 U/L  Urine microscopic-add on  Result Value Ref Range   Squamous Epithelial / LPF TOO NUMEROUS TO COUNT (A) NONE SEEN   WBC, UA 0-5 0 - 5 WBC/hpf   RBC / HPF 0-5 0 - 5 RBC/hpf   Bacteria, UA MANY (A) NONE SEEN  CBG monitoring, ED  Result Value Ref Range   Glucose-Capillary 116 (H) 65 - 99 mg/dL  Type and screen  Result Value Ref Range   ABO/RH(D) A POS    Antibody Screen PENDING    Sample Expiration 04/14/2015    Dg Chest 2 View  04/11/2015  CLINICAL DATA:  Syncope, left leg injury EXAM: CHEST  2 VIEW COMPARISON:  11/26/2013 FINDINGS: Cardiomediastinal silhouette is stable. No acute infiltrate or pleural effusion. No pulmonary edema. Metallic fixation plate and screws noted in right humerus. IMPRESSION: No active cardiopulmonary disease. Electronically Signed   By: Lahoma Crocker M.D.   On: 04/11/2015 15:56   Dg Tibia/fibula Left  04/11/2015  CLINICAL DATA:  Fall this morning, left lower leg pain EXAM: LEFT TIBIA AND FIBULA - 2 VIEW COMPARISON:   None. FINDINGS: Three views of the left tibia fibula submitted. Degenerative changes are noted left knee joint with significant narrowing of medial joint compartment and spurring of medial femoral condyle and medial tibial plateau. There is mild displaced oblique fracture in proximal shaft of left fibula best seen on lateral view. IMPRESSION: Mild displaced oblique fracture proximal shaft of left fibula best seen on lateral view. Extensive degenerative changes left knee joint. Electronically Signed   By: Lahoma Crocker M.D.   On: 04/11/2015 15:55   Dg Ankle Complete Left  04/11/2015  CLINICAL DATA:  Status post fall.  Pain. EXAM: LEFT ANKLE COMPLETE - 3+ VIEW COMPARISON:  None. FINDINGS: There is a small ossific fragment adjacent to the lateral aspect of the distal tibial epiphysis at the distal tibiofibular joint concerning for a small fractured of indeterminate age. There is a small plantar calcaneal spur. Soft tissues are unremarkable. IMPRESSION: 1. Small ossific fragment adjacent to the lateral aspect of the distal tibial epiphysis at the distal tibiofibular joint concerning for a small fractured of indeterminate age. Electronically Signed   By: Kathreen Devoid   On: 04/11/2015 15:59       I have personally reviewed and evaluated these images and lab results as part of my medical decision-making.   EKG Interpretation   Date/Time:  Monday April 11 2015 13:39:31 EST Ventricular Rate:  115 PR Interval:    QRS Duration: 88 QT Interval:  333 QTC Calculation: 461 R Axis:   77 Text Interpretation:  Sinus tachycardia Nonspecific ST abnormality  Confirmed by Ashok Cordia  MD, Lennette Bihari ([id number]) on 04/11/2015 2:40:50 PM      MDM   Iv ns bolus. Continuous pulse ox and monitor.  Labs.  Reviewed nursing notes and prior charts for additional history.   Iv ns bolus. protonix iv.   Fibula fx on xr, possible  chip fx med mall - will consult ortho.  Camwalker. Elevate, ice. Nwb.  Dr [place] Calix APP consulted,  reviewed films and will see in ED.   Morphine iv.    Recheck, minimal swelling to left lower leg, compartments soft. Distal pulses palp.  Rectal, melena, heme pos,  Will consult gi - discuss with Dr Nichola Sizer APP - they will see/consult on Patient (they indicate do have multiple other consults ahead of pt, so may take some time).  Given syncope/gib, will admit to hospitalists. Discussed w Dr Daleen Bo - will admit.         Lajean Saver, MD 04/11/15 [phone]

## 2015-04-11 NOTE — Consult Note (Signed)
Orthopaedic Trauma Service (OTS) Consult   Reason for Consult: Left fibular fracture and left ankle fracture  Referring Physician: K. Ashok Cordia, MD (EDP)   HPI: Krista Carter is an 60 y.o. white female who presented to the ED today after a syncopal episode. Patient was at home when she was walking to the bathroom and passed out. She apparently vomited blood blood as well. History of peptic ulcer disease. In the fall she also sustained an injury to her left lower leg. Patient has diffuse tenderness to the left ankle as well as her left fibula.  denies any additional injuries to her lower extremities. She denies any numbness or tingling  Past Medical History  Diagnosis Date  . Diabetes mellitus   . Hypertension   . Hypercholesteremia   . Renal insufficiency   . GERD (gastroesophageal reflux disease)   . Hiatal hernia     Past Surgical History  Procedure Laterality Date  . Abdominal hysterectomy      Family History  Problem Relation Age of Onset  . Stroke Mother   . Kidney cancer Mother   . Colon cancer Maternal Uncle     Social History:  reports that she has been smoking Cigarettes.  She has been smoking about 1.00 pack per day. She has never used smokeless tobacco. She reports that she drinks alcohol. She reports that she does not use illicit drugs.  Allergies: No Known Allergies  Medications: I have reviewed the patient's current medications. Prior to Admission:  (Not in a hospital admission)  Results for orders placed or performed during the hospital encounter of 04/11/15 (from the past 48 hour(s))  Basic metabolic panel     Status: Abnormal   Collection Time: 04/11/15  1:47 PM  Result Value Ref Range   Sodium 143 135 - 145 mmol/L   Potassium 3.5 3.5 - 5.1 mmol/L   Chloride 103 101 - 111 mmol/L   CO2 27 22 - 32 mmol/L   Glucose, Bld 141 (H) 65 - 99 mg/dL   BUN 15 6 - 20 mg/dL   Creatinine, Ser 0.83 0.44 - 1.00 mg/dL   Calcium 8.8 (L) 8.9 - 10.3 mg/dL   GFR calc non  Af Amer >60 >60 mL/min   GFR calc Af Amer >60 >60 mL/min    Comment: (NOTE) The eGFR has been calculated using the CKD EPI equation. This calculation has not been validated in all clinical situations. eGFR's persistently <60 mL/min signify possible Chronic Kidney Disease.    Anion gap 13 5 - 15  CBC     Status: Abnormal   Collection Time: 04/11/15  1:47 PM  Result Value Ref Range   WBC 12.6 (H) 4.0 - 10.5 K/uL   RBC 3.77 (L) 3.87 - 5.11 MIL/uL   Hemoglobin 12.2 12.0 - 15.0 g/dL   HCT 37.0 36.0 - 46.0 %   MCV 98.1 78.0 - 100.0 fL   MCH 32.4 26.0 - 34.0 pg   MCHC 33.0 30.0 - 36.0 g/dL   RDW 13.8 11.5 - 15.5 %   Platelets 278 150 - 400 K/uL  CBG monitoring, ED     Status: Abnormal   Collection Time: 04/11/15  2:02 PM  Result Value Ref Range   Glucose-Capillary 116 (H) 65 - 99 mg/dL  Urinalysis, Routine w reflex microscopic (not at [facility])     Status: Abnormal   Collection Time: 04/11/15  2:57 PM  Result Value Ref Range   Color, Urine AMBER (A) YELLOW  Comment: BIOCHEMICALS MAY BE AFFECTED BY COLOR   APPearance CLOUDY (A) CLEAR   Specific Gravity, Urine 1.024 1.005 - 1.030   pH 6.0 5.0 - 8.0   Glucose, UA NEGATIVE NEGATIVE mg/dL   Hgb urine dipstick NEGATIVE NEGATIVE   Bilirubin Urine LARGE (A) NEGATIVE   Ketones, ur NEGATIVE NEGATIVE mg/dL   Protein, ur 30 (A) NEGATIVE mg/dL   Nitrite NEGATIVE NEGATIVE   Leukocytes, UA NEGATIVE NEGATIVE  Urine microscopic-add on     Status: Abnormal   Collection Time: 04/11/15  2:57 PM  Result Value Ref Range   Squamous Epithelial / LPF TOO NUMEROUS TO COUNT (A) NONE SEEN   WBC, UA 0-5 0 - 5 WBC/hpf   RBC / HPF 0-5 0 - 5 RBC/hpf   Bacteria, UA MANY (A) NONE SEEN  Type and screen     Status: None   Collection Time: 04/11/15  3:10 PM  Result Value Ref Range   ABO/RH(D) A POS    Antibody Screen NEG    Sample Expiration 04/14/2015   Hepatic function panel     Status: Abnormal   Collection Time: 04/11/15  3:12 PM  Result Value Ref Range    Total Protein 6.1 (L) 6.5 - 8.1 g/dL   Albumin 3.0 (L) 3.5 - 5.0 g/dL   AST 14 (L) 15 - 41 U/L   ALT 9 (L) 14 - 54 U/L   Alkaline Phosphatase 72 38 - 126 U/L   Total Bilirubin 0.4 0.3 - 1.2 mg/dL   Bilirubin, Direct 0.2 0.1 - 0.5 mg/dL   Indirect Bilirubin 0.2 (L) 0.3 - 0.9 mg/dL  Protime-INR     Status: None   Collection Time: 04/11/15  3:12 PM  Result Value Ref Range   Prothrombin Time 14.0 11.6 - 15.2 seconds   INR 1.06 0.00 - 1.49  Troponin I     Status: None   Collection Time: 04/11/15  3:12 PM  Result Value Ref Range   Troponin I <0.03 <0.031 ng/mL    Comment:        NO INDICATION OF MYOCARDIAL INJURY.   Lipase, blood     Status: None   Collection Time: 04/11/15  3:12 PM  Result Value Ref Range   Lipase 20 11 - 51 U/L  POC occult blood, ED Provider will collect     Status: Abnormal   Collection Time: 04/11/15  4:25 PM  Result Value Ref Range   Fecal Occult Bld POSITIVE (A) NEGATIVE    Dg Chest 2 View  04/11/2015  CLINICAL DATA:  Syncope, left leg injury EXAM: CHEST  2 VIEW COMPARISON:  11/26/2013 FINDINGS: Cardiomediastinal silhouette is stable. No acute infiltrate or pleural effusion. No pulmonary edema. Metallic fixation plate and screws noted in right humerus. IMPRESSION: No active cardiopulmonary disease. Electronically Signed   By: Lahoma Crocker M.D.   On: 04/11/2015 15:56   Dg Tibia/fibula Left  04/11/2015  CLINICAL DATA:  Fall this morning, left lower leg pain EXAM: LEFT TIBIA AND FIBULA - 2 VIEW COMPARISON:  None. FINDINGS: Three views of the left tibia fibula submitted. Degenerative changes are noted left knee joint with significant narrowing of medial joint compartment and spurring of medial femoral condyle and medial tibial plateau. There is mild displaced oblique fracture in proximal shaft of left fibula best seen on lateral view. IMPRESSION: Mild displaced oblique fracture proximal shaft of left fibula best seen on lateral view. Extensive degenerative changes left  knee joint. Electronically Signed   By: Julien Girt  Pop M.D.   On: 04/11/2015 15:55   Dg Ankle Complete Left  04/11/2015  CLINICAL DATA:  Status post fall.  Pain. EXAM: LEFT ANKLE COMPLETE - 3+ VIEW COMPARISON:  None. FINDINGS: There is a small ossific fragment adjacent to the lateral aspect of the distal tibial epiphysis at the distal tibiofibular joint concerning for a small fractured of indeterminate age. There is a small plantar calcaneal spur. Soft tissues are unremarkable. IMPRESSION: 1. Small ossific fragment adjacent to the lateral aspect of the distal tibial epiphysis at the distal tibiofibular joint concerning for a small fractured of indeterminate age. Electronically Signed   By: Kathreen Devoid   On: 04/11/2015 15:59    ROS  As above in the history of present illness Blood pressure 139/85, pulse 95, temperature 98.5 F (36.9 C), temperature source Oral, resp. rate 18, SpO2 100 %. Physical Exam  Constitutional:  Appears older than stated age  Musculoskeletal:  Left lower extremity Inspection:   No appreciable knee effusion   No significant ankle effusion or swelling of the left ankle   No ecchymosis to the left ankle   Hip is unremarkable Bony eval:   Exquisite tenderness palpation midshaft of left fibula   Tenderness with palpation along the syndesmosis of her left ankle    Medial malleolus is relatively nontender as is her lateral malleolus    Patient has pain with axial loading of her left ankle. She also has pain and discomfort with compression of the mid shaft of her tibia and fibula (squeeze test)   Also some discomfort with Kleiger's test    No pain with axial loading or logrolling of her hip  Soft tissue:    No significant swelling or ecchymosis of the left ankle    No severe swelling of the leg ROM:    Pain with ankle range of motion    Hip and knee motion not evaluated Sensation:    DPN, SPN, TN sensory functions intact Motor:    EHL, FHL, anterior tibialis,  posterior tibialis, peroneals gastrocsoleus complex motor functions grossly intact  Vascular:    + DP pulse    Compartments are soft, no pain with passive stretch     Extremity is warm   Nursing note and vitals reviewed.    Assessment/Plan:  60 year old female status post ground level fall secondary to syncopal episode with left fibular fracture and left ankle fracture  1. Left proximal fibula fracture and left distal lateral tibia fracture, concerning for a syndesmotic disruption   We will need to obtain a stress view of her left ankle to evaluate the integrity of the syndesmosis. Her clinical exam is concerning for injury  We will have the patient placed into a posterior and stirrup splint. She'll be nonweightbearing.  We would plan for stress view in 1 week to allow for some of her pain to abate. This can be done here in the hospital or follow-up in the office.  Ice and elevate leg above heart  PT and OT evaluations when okay with medical and GI services   2. DVT/PE prophylaxis:  Does not require pharmacologic prophylaxis from an orthopedic standpoint  3. Metabolic bone disease  Check vitamin D levels  4. Activity:  Nonweightbearing left leg   5. Dispo:  Per medicine and GI   Jari Pigg, PA-C [facility] [phone] (P) 04/11/2015, 5:07 PM

## 2015-04-11 NOTE — ED Notes (Signed)
Bed: WA01 Expected date:  Expected time:  Means of arrival:  Comments: Orthostatic, abd pain

## 2015-04-12 ENCOUNTER — Inpatient Hospital Stay (HOSPITAL_COMMUNITY): Payer: BC Managed Care – PPO

## 2015-04-12 ENCOUNTER — Encounter (HOSPITAL_COMMUNITY): Payer: Self-pay | Admitting: Physician Assistant

## 2015-04-12 DIAGNOSIS — K920 Hematemesis: Secondary | ICD-10-CM

## 2015-04-12 DIAGNOSIS — Z8711 Personal history of peptic ulcer disease: Secondary | ICD-10-CM

## 2015-04-12 DIAGNOSIS — R112 Nausea with vomiting, unspecified: Secondary | ICD-10-CM

## 2015-04-12 DIAGNOSIS — R110 Nausea: Secondary | ICD-10-CM

## 2015-04-12 DIAGNOSIS — K922 Gastrointestinal hemorrhage, unspecified: Secondary | ICD-10-CM

## 2015-04-12 DIAGNOSIS — R55 Syncope and collapse: Secondary | ICD-10-CM

## 2015-04-12 DIAGNOSIS — R1110 Vomiting, unspecified: Secondary | ICD-10-CM | POA: Diagnosis present

## 2015-04-12 LAB — CBC
HCT: 29.4 % — ABNORMAL LOW (ref 36.0–46.0)
Hemoglobin: 9.7 g/dL — ABNORMAL LOW (ref 12.0–15.0)
MCH: 32.2 pg (ref 26.0–34.0)
MCHC: 33 g/dL (ref 30.0–36.0)
MCV: 97.7 fL (ref 78.0–100.0)
PLATELETS: 237 10*3/uL (ref 150–400)
RBC: 3.01 MIL/uL — AB (ref 3.87–5.11)
RDW: 13.8 % (ref 11.5–15.5)
WBC: 11.6 10*3/uL — ABNORMAL HIGH (ref 4.0–10.5)

## 2015-04-12 LAB — HEMOGLOBIN AND HEMATOCRIT, BLOOD
HCT: 28.5 % — ABNORMAL LOW (ref 36.0–46.0)
HEMATOCRIT: 25.9 % — AB (ref 36.0–46.0)
HEMOGLOBIN: 9.2 g/dL — AB (ref 12.0–15.0)
Hemoglobin: 8.4 g/dL — ABNORMAL LOW (ref 12.0–15.0)

## 2015-04-12 LAB — BASIC METABOLIC PANEL
Anion gap: 7 (ref 5–15)
BUN: 35 mg/dL — AB (ref 6–20)
CALCIUM: 8.4 mg/dL — AB (ref 8.9–10.3)
CHLORIDE: 107 mmol/L (ref 101–111)
CO2: 28 mmol/L (ref 22–32)
CREATININE: 0.67 mg/dL (ref 0.44–1.00)
GFR calc non Af Amer: >60 mL/min (ref >60)
GLUCOSE: 91 mg/dL (ref 65–99)
Potassium: 3.5 mmol/L (ref 3.5–5.1)
Sodium: 142 mmol/L (ref 135–145)

## 2015-04-12 MED ORDER — SODIUM CHLORIDE 0.9 % IJ SOLN
3.0000 mL | Freq: Two times a day (BID) | INTRAMUSCULAR | Status: DC
  Administered 2015-04-12 – 2015-04-14 (×3): 3 mL via INTRAVENOUS

## 2015-04-12 MED ORDER — METOPROLOL SUCCINATE ER 50 MG PO TB24
50.0000 mg | ORAL_TABLET | Freq: Every day | ORAL | Status: DC
  Administered 2015-04-13 – 2015-04-14 (×2): 50 mg via ORAL
  Filled 2015-04-12 (×2): qty 1

## 2015-04-12 MED ORDER — MORPHINE SULFATE (PF) 2 MG/ML IV SOLN
2.0000 mg | INTRAVENOUS | Status: DC | PRN
  Administered 2015-04-12 – 2015-04-14 (×13): 2 mg via INTRAVENOUS
  Filled 2015-04-12 (×13): qty 1

## 2015-04-12 MED ORDER — ONDANSETRON HCL 4 MG/2ML IJ SOLN
4.0000 mg | Freq: Four times a day (QID) | INTRAMUSCULAR | Status: DC | PRN
  Administered 2015-04-12 – 2015-04-13 (×3): 4 mg via INTRAVENOUS
  Filled 2015-04-12 (×3): qty 2

## 2015-04-12 NOTE — Progress Notes (Signed)
  Echocardiogram 2D Echocardiogram has been performed.  Krista Carter 04/12/2015, 12:00 PM

## 2015-04-12 NOTE — Progress Notes (Signed)
[facility] PROGRESS NOTE  Krista Carter [id number] DOB: [birth year] DOA: 04/11/2015 PCP: Donnie Coffin, MD  Assessment/Plan: 60 y.o. female with PMH of HTN, DM, Anxiety, PUD presented with syncope and GI bleeding. Also found to have L fibula fracture   1. GI bleeding. H/oPUD. Pt is hemodynamically stable. Hg 12.5 on admission but trending down. ? UGIB. Pt has h/o EGD (10/2013): persistent bouts smaller gastric ulcers in prepyloric with no stigmata of bleeding Distal esophagitis. And Colonoscopy (09/2013): normal  -we will cont monitor for bleeding, serial Hg. TF PRBCs prn. cont IV PPI. IVF as needed. Scheduled for EGD today   2. Syncope in the setting of GI bleeding. Suspected Element of vasovagal/vomiting related to syncope.  -we will cont to monitor. TF blood as needed. Resume Benzo to prevent withdrawals seizures  3. Fall, L proximal fibula fracture. Per ortho: Pt will need further imaging as outpatient in 1 week. NWB to L leg. Cont pain control  4. HTN. Decrease metoprolol to 50 and hold lisinopril due to soft BP. Monitor  5. Anxiety. Pt is dependant on benzo-chronic use. restarted clonopin to prevent withdrawals   Code Status: full Family Communication: d/w patient, her family recently.  (indicate person spoken with, relationship, and if by phone, the number) Disposition Plan: pend EGD   Consultants:  GI  Procedures:  Pend EGD  Antibiotics:  none (indicate start date, and stop date if known)  HPI/Subjective: Alert, oriented. Reports leg pain due to fracture. She denies further bleeding overnight, but Hg dropped.   Objective: Filed Vitals:   04/12/15 0135 04/12/15 0540  BP: 113/72 108/66  Pulse: 85 81  Temp: 99.3 F (37.4 C) 98.8 F (37.1 C)  Resp: 18 18    Intake/Output Summary (Last 24 hours) at 04/12/15 1021 Last data filed at 04/12/15 0700  Gross per 24 hour  Intake 1746.67 ml  Output      0 ml  Net 1746.67 ml   Filed Weights   04/12/15  0135  Weight: 59.5 kg (131 lb 2.8 oz)    Exam:   General:  No distress   Cardiovascular: s1,s2 rrr  Respiratory: CTA BL  Abdomen: soft, mild epigastric tender,. No rebound   Musculoskeletal: no pedal edema.    Data Reviewed: Basic Metabolic Panel:  Recent Labs Lab 04/11/15 1347 04/12/15 0140  NA 143 142  K 3.5 3.5  CL 103 107  CO2 27 28  GLUCOSE 141* 91  BUN 15 35*  CREATININE 0.83 0.67  CALCIUM 8.8* 8.4*   Liver Function Tests:  Recent Labs Lab 04/11/15 1512  AST 14*  ALT 9*  ALKPHOS 72  BILITOT 0.4  PROT 6.1*  ALBUMIN 3.0*    Recent Labs Lab 04/11/15 1512  LIPASE 20   No results for input(s): AMMONIA in the last 168 hours. CBC:  Recent Labs Lab 04/11/15 1347 04/12/15 0140 04/12/15 0755  WBC 12.6* 11.6*  --   HGB 12.2 9.7* 9.2*  HCT 37.0 29.4* 28.5*  MCV 98.1 97.7  --   PLT 278 237  --    Cardiac Enzymes:  Recent Labs Lab 04/11/15 1512  TROPONINI <0.03   BNP (last 3 results) No results for input(s): BNP in the last 8760 hours.  ProBNP (last 3 results) No results for input(s): PROBNP in the last 8760 hours.  CBG:  Recent Labs Lab 04/11/15 1402  GLUCAP 116*    No results found for this or any previous visit (from the past 240 hour(s)).  Studies: Dg Chest 2 View  04/11/2015  CLINICAL DATA:  Syncope, left leg injury EXAM: CHEST  2 VIEW COMPARISON:  11/26/2013 FINDINGS: Cardiomediastinal silhouette is stable. No acute infiltrate or pleural effusion. No pulmonary edema. Metallic fixation plate and screws noted in right humerus. IMPRESSION: No active cardiopulmonary disease. Electronically Signed   By: Lahoma Crocker M.D.   On: 04/11/2015 15:56   Dg Tibia/fibula Left  04/11/2015  CLINICAL DATA:  Fall this morning, left lower leg pain EXAM: LEFT TIBIA AND FIBULA - 2 VIEW COMPARISON:  None. FINDINGS: Three views of the left tibia fibula submitted. Degenerative changes are noted left knee joint with significant narrowing of medial joint  compartment and spurring of medial femoral condyle and medial tibial plateau. There is mild displaced oblique fracture in proximal shaft of left fibula best seen on lateral view. IMPRESSION: Mild displaced oblique fracture proximal shaft of left fibula best seen on lateral view. Extensive degenerative changes left knee joint. Electronically Signed   By: Lahoma Crocker M.D.   On: 04/11/2015 15:55   Dg Ankle Complete Left  04/11/2015  CLINICAL DATA:  Status post fall.  Pain. EXAM: LEFT ANKLE COMPLETE - 3+ VIEW COMPARISON:  None. FINDINGS: There is a small ossific fragment adjacent to the lateral aspect of the distal tibial epiphysis at the distal tibiofibular joint concerning for a small fractured of indeterminate age. There is a small plantar calcaneal spur. Soft tissues are unremarkable. IMPRESSION: 1. Small ossific fragment adjacent to the lateral aspect of the distal tibial epiphysis at the distal tibiofibular joint concerning for a small fractured of indeterminate age. Electronically Signed   By: Kathreen Devoid   On: 04/11/2015 15:59    Scheduled Meds: . clonazePAM  0.5 mg Oral BID  . lisinopril  10 mg Oral Daily  . metoprolol succinate  100 mg Oral Daily  . pantoprazole (PROTONIX) IV  40 mg Intravenous Q12H  . sodium chloride  3 mL Intravenous Q12H   Continuous Infusions: . sodium chloride 100 mL/hr at 04/12/15 0032    Active Problems:   Syncope   GI bleeding    Time spent: >35 minutes     Kinnie Feil  [facility] Pager [phone]. If 7PM-7AM, please contact night-coverage at [email or web address], password [facility] 04/12/2015, 10:21 AM  LOS: 1 day

## 2015-04-12 NOTE — Care Management Note (Signed)
Case Management Note  Patient Details  Name: Krista Carter MRN: [id number] Date of Birth: October 23, 1955  Subjective/Objective:  60 y/o f admitted w/GIB. Hx:L fib fx.From home.   NWB on L.               Action/Plan:d/c plan home.   Expected Discharge Date:   (unknown)               Expected Discharge Plan:  Home/Self Care  In-House Referral:     Discharge planning Services  CM Consult  Post Acute Care Choice:    Choice offered to:     DME Arranged:    DME Agency:     HH Arranged:    HH Agency:     Status of Service:  In process, will continue to follow  Medicare Important Message Given:    Date Medicare IM Given:    Medicare IM give by:    Date Additional Medicare IM Given:    Additional Medicare Important Message give by:     If discussed at [place] of Stay Meetings, dates discussed:    Additional Comments:  Dessa Phi, RN 04/12/2015, 4:06 PM

## 2015-04-12 NOTE — Consult Note (Signed)
Referring Provider: [facility] Primary Care Physician:  Donnie Coffin, MD Primary Gastroenterologist:  Dr. Delfin Edis  Reason for Consultation:   hematemesis     HPI: Krista Carter is a 60 y.o. female who is known to Dr. Delfin Edis. She has a history of GERD and IBS. Upper endoscopy 09/30/2013 revealed 2 gastric ulcers in the gastric antrum most likely benign. Biopsies negative for H. pylori. She was advised to continue a PPI and discontinue oral anti-inflammatory agents and aspirin. She had a repeat endoscopy on 11/18/2013 at which time she was noted to have persistent bouts of smaller gastric ulcers in the prepyloric area with no stigmata of bleeding. She also had a distal esophagitis. She was instructed to continue her PPI and avoid nonsteroidal anti-inflammatories. She last had a colonoscopy 09/30/2013 which was a normal exam.  Other pertinent medical history significant for diabetes and hypertension. Patient reports that she has been having headaches and joint pains for several months and has been using Goody powders 3-6 times per day for the past several months. She states 2 or 3 weeks ago she began to develop epigastric pain with nausea. She has also had intermittent diarrhea 4. At times well. She denies bright red blood per rectum or melena and states her diarrhea was very light-colored. She presented to the emergency room yesterday with worsening epigastric pain and complaints of hematemesis. She states yesterday morning she felt very nauseous and vomited a moderate amount of bright red blood. She became dizzy and fell sustaining a left fibular fracture and left ankle fracture. She reports that she stopped her PPI almost a year ago because she could no longer afford it. She continues to smoke cigarettes. She does drink alcohol intermittently but denies use of illicit drugs. In the emergency room she was noted to have a hemoglobin of 12.2. Hemoglobin this morning is 9.2. Patient denies  further hematemesis but states she is quite nauseous and continues to have epigastric pain. She has not moved her bowels since arriving to the hospital. MCV is 97.7.  Past Medical History  Diagnosis Date  . Diabetes mellitus   . Hypertension   . Hypercholesteremia   . Renal insufficiency   . GERD (gastroesophageal reflux disease)   . Hiatal hernia     Past Surgical History  Procedure Laterality Date  . Abdominal hysterectomy      Prior to Admission medications   Medication Sig Start Date End Date Taking? Authorizing Provider  aspirin EC 81 MG tablet Take 81 mg by mouth daily.     Yes Historical Provider, MD  clonazePAM (KLONOPIN) 0.5 MG tablet Take 0.5 mg by mouth 2 (two) times daily as needed. Anxiety 02/01/15  Yes Historical Provider, MD  lisinopril (PRINIVIL,ZESTRIL) 10 MG tablet Take 10 mg by mouth daily.   Yes Historical Provider, MD  metoprolol succinate (TOPROL-XL) 100 MG 24 hr tablet Take 100 mg by mouth daily. 02/02/15  Yes Historical Provider, MD  pantoprazole (PROTONIX) 40 MG tablet Take 40 mg by mouth daily.   Yes Historical Provider, MD  famotidine (PEPCID) 20 MG tablet Take 1 tablet (20 mg total) by mouth 2 (two) times daily. Patient not taking: Reported on 04/11/2015 02/28/15   Noemi Chapel, MD  loperamide (IMODIUM A-D) 2 MG tablet As instructed for diarrhea Patient not taking: Reported on 04/11/2015 11/28/13   Delfina Redwood, MD    Current Facility-Administered Medications  Medication Dose Route Frequency Provider Last Rate Last Dose  . 0.9 %  sodium chloride infusion   Intravenous Continuous Kinnie Feil, MD 100 mL/hr at 04/12/15 0032    . clonazePAM (KLONOPIN) tablet 0.5 mg  0.5 mg Oral BID Kinnie Feil, MD   0.5 mg at 04/12/15 0026  . famotidine (PEPCID) tablet 20 mg  20 mg Oral BID Kinnie Feil, MD   20 mg at 04/12/15 0026  . lisinopril (PRINIVIL,ZESTRIL) tablet 10 mg  10 mg Oral Daily Kinnie Feil, MD      . metoprolol succinate (TOPROL-XL) 24 hr  tablet 100 mg  100 mg Oral Daily Kinnie Feil, MD      . morphine 2 MG/ML injection 2 mg  2 mg Intravenous Q4H PRN Kinnie Feil, MD   2 mg at 04/12/15 0355  . oxyCODONE (Oxy IR/ROXICODONE) immediate release tablet 5 mg  5 mg Oral Q4H PRN Kinnie Feil, MD   5 mg at 04/12/15 0209  . pantoprazole (PROTONIX) injection 40 mg  40 mg Intravenous Q12H Kinnie Feil, MD      . sodium chloride 0.9 % injection 3 mL  3 mL Intravenous Q12H Kinnie Feil, MD   3 mL at 04/12/15 0137    Allergies as of 04/11/2015  . (No Known Allergies)    Family History  Problem Relation Age of Onset  . Stroke Mother   . Kidney cancer Mother   . Colon cancer Maternal Uncle     Social History   Social History  . Marital Status: Married    Spouse Name: N/A  . Number of Children: 2  . Years of Education: N/A   Occupational History  . Retired    Social History Main Topics  . Smoking status: Current Every Day Smoker -- 1.00 packs/day    Types: Cigarettes  . Smokeless tobacco: Never Used  . Alcohol Use: Yes     Comment: "just socially"  . Drug Use: No  . Sexual Activity: Yes    Birth Control/ Protection: None   Other Topics Concern  . Not on file   Social History Narrative    Review of Systems: Gen:  CV: Denies chest pain, angina, palpitations, syncope, orthopnea, PND, peripheral edema, and claudicatio admits to weakness and fatigue n. Resp: Denies dyspnea at rest, dyspnea with exercise, cough, sputum, wheezing, coughing up blood, and pleurisy. GI: Admits to nausea, vomiting, hematemesis, and loose stools. GU : Denies urinary burning, blood in urine, urinary frequency, urinary hesitancy, nocturnal urination, and urinary incontinence. MS: Admits to back pain, bilateral knee pain, left ankle pain..  Derm: Denies rash, itching, dry skin, hives, moles, warts, or unhealing ulcers.  Psych: Denies depression, anxiety, memory loss, suicidal ideation, hallucinations, paranoia, and  confusion. Heme: Denies bruising, bleeding, and enlarged lymph nodes. Neuro:  Admits to headaches and dizziness. Endo:  Denies any problems with DM, thyroid, adrenal function.  Physical Exam: Vital signs in last 24 hours: Temp:  [97.9 F (36.6 C)-99.3 F (37.4 C)] 98.8 F (37.1 C) (01/10 0540) Pulse Rate:  [79-112] 81 (01/10 0540) Resp:  [13-27] 18 (01/10 0540) BP: (97-140)/(66-110) 108/66 mmHg (01/10 0540) SpO2:  [90 %-100 %] 95 % (01/10 0540) Weight:  [131 lb 2.8 oz (59.5 kg)] 131 lb 2.8 oz (59.5 kg) (01/10 0135)   General:   Alert,  Well-developed, well-nourished, pleasant and cooperative in NAD Head:  Normocephalic and atraumatic. Eyes:  Sclera clear, no icterus.   Conjunctiva pink. Ears:  Normal auditory acuity. Nose:  No deformity, discharge,  or lesions.  Mouth:  No deformity or lesions.   Neck:  Supple; no masses or thyromegaly. Lungs:  Clear throughout to auscultation.   No wheezes, crackles, or rhonchi.  Heart:  Regular rate and rhythm; no murmurs, clicks, rubs,  or gallops. Abdomen:  Soft, mild epigastric tenderness to palpation with no rebound or guarding., BS active,nonpalp mass or hsm.   Rectal:  Deferred  Msk:  Symmetrical without gross deformities. . Pulses:  Normal pulses noted. Extremities: Left lower extremity splint in place Neurologic:  Alert and  oriented x4;  grossly normal neurologically. Skin:  Intact without significant lesions or rashes.. Psych:  Alert and cooperative. Normal mood and affect.  Intake/Output from previous day: 01/09 0701 - 01/10 0700 In: 1746.7 [I.V.:646.7; IV Piggyback:1100] Out: -  Intake/Output this shift:    Lab Results:  Recent Labs  04/11/15 1347 04/12/15 0140  WBC 12.6* 11.6*  HGB 12.2 9.7*  HCT 37.0 29.4*  PLT 278 237   BMET  Recent Labs  04/11/15 1347 04/12/15 0140  NA 143 142  K 3.5 3.5  CL 103 107  CO2 27 28  GLUCOSE 141* 91  BUN 15 35*  CREATININE 0.83 0.67  CALCIUM 8.8* 8.4*   LFT  Recent  Labs  04/11/15 1512  PROT 6.1*  ALBUMIN 3.0*  AST 14*  ALT 9*  ALKPHOS 72  BILITOT 0.4  BILIDIR 0.2  IBILI 0.2*   PT/INR  Recent Labs  04/11/15 1512  LABPROT 14.0  INR 1.06     Studies/Results: Dg Chest 2 View  04/11/2015  CLINICAL DATA:  Syncope, left leg injury EXAM: CHEST  2 VIEW COMPARISON:  11/26/2013 FINDINGS: Cardiomediastinal silhouette is stable. No acute infiltrate or pleural effusion. No pulmonary edema. Metallic fixation plate and screws noted in right humerus. IMPRESSION: No active cardiopulmonary disease. Electronically Signed   By: Lahoma Crocker M.D.   On: 04/11/2015 15:56   Dg Tibia/fibula Left  04/11/2015  CLINICAL DATA:  Fall this morning, left lower leg pain EXAM: LEFT TIBIA AND FIBULA - 2 VIEW COMPARISON:  None. FINDINGS: Three views of the left tibia fibula submitted. Degenerative changes are noted left knee joint with significant narrowing of medial joint compartment and spurring of medial femoral condyle and medial tibial plateau. There is mild displaced oblique fracture in proximal shaft of left fibula best seen on lateral view. IMPRESSION: Mild displaced oblique fracture proximal shaft of left fibula best seen on lateral view. Extensive degenerative changes left knee joint. Electronically Signed   By: Lahoma Crocker M.D.   On: 04/11/2015 15:55   Dg Ankle Complete Left  04/11/2015  CLINICAL DATA:  Status post fall.  Pain. EXAM: LEFT ANKLE COMPLETE - 3+ VIEW COMPARISON:  None. FINDINGS: There is a small ossific fragment adjacent to the lateral aspect of the distal tibial epiphysis at the distal tibiofibular joint concerning for a small fractured of indeterminate age. There is a small plantar calcaneal spur. Soft tissues are unremarkable. IMPRESSION: 1. Small ossific fragment adjacent to the lateral aspect of the distal tibial epiphysis at the distal tibiofibular joint concerning for a small fractured of indeterminate age. Electronically Signed   By: Kathreen Devoid   On:  04/11/2015 15:59    IMPRESSION/PLAN:  60 year old female with a history of peptic ulcer disease presenting with recurrent epigastric pain, nausea, and hematemesis. Patient has been off her PPI for about a year and has been using copious amounts of Goody powders. She also continues to smoke and drink alcohol. Suspect bleeding from  recurrent ulcers, gastritis or esophagitis. Would continue twice a day PPI. Around the clock antiemetics. IV fluids. Will schedule for EGD to evaluate for esophagitis, gastritis, ulcer, etc. later today.The risks, benefits, and alternatives to endoscopy with possible biopsy and possible dilation were discussed with the patient and they consent to proceed. Trend hemoglobin. Transfuse as needed to keep hemoglobin above 8.  Left fibular fracture/left ankle fracture-management per orthopedics Hypertension Anxiety Syncope-in setting of GI bleed, likely vasovagal   Lan Mcneill, Vita Barley PA-C 04/12/2015,  Pager [phone]  Mon-Fri 8a-5p [phone] after 5p, weekends, holidays

## 2015-04-13 ENCOUNTER — Encounter (HOSPITAL_COMMUNITY)
Admission: EM | Disposition: A | Discharge status: Home / Self Care | Payer: Self-pay | Source: Home / Self Care | Attending: Internal Medicine

## 2015-04-13 ENCOUNTER — Inpatient Hospital Stay (HOSPITAL_COMMUNITY): Payer: BC Managed Care – PPO | Admitting: Certified Registered Nurse Anesthetist

## 2015-04-13 ENCOUNTER — Encounter (HOSPITAL_COMMUNITY): Payer: Self-pay | Admitting: *Deleted

## 2015-04-13 DIAGNOSIS — S82402D Unspecified fracture of shaft of left fibula, subsequent encounter for closed fracture with routine healing: Secondary | ICD-10-CM

## 2015-04-13 DIAGNOSIS — K254 Chronic or unspecified gastric ulcer with hemorrhage: Secondary | ICD-10-CM

## 2015-04-13 DIAGNOSIS — K253 Acute gastric ulcer without hemorrhage or perforation: Secondary | ICD-10-CM | POA: Diagnosis present

## 2015-04-13 HISTORY — PX: ESOPHAGOGASTRODUODENOSCOPY (EGD) WITH PROPOFOL: SHX5813

## 2015-04-13 LAB — CBC
HEMATOCRIT: 25.9 % — AB (ref 36.0–46.0)
HEMOGLOBIN: 8.3 g/dL — AB (ref 12.0–15.0)
MCH: 32.2 pg (ref 26.0–34.0)
MCHC: 32 g/dL (ref 30.0–36.0)
MCV: 100.4 fL — AB (ref 78.0–100.0)
PLATELETS: 195 10*3/uL (ref 150–400)
RBC: 2.58 MIL/uL — AB (ref 3.87–5.11)
RDW: 13.8 % (ref 11.5–15.5)
WBC: 7.6 10*3/uL (ref 4.0–10.5)

## 2015-04-13 LAB — BASIC METABOLIC PANEL
Anion gap: 5 (ref 5–15)
BUN: 16 mg/dL (ref 6–20)
CALCIUM: 8.1 mg/dL — AB (ref 8.9–10.3)
CO2: 29 mmol/L (ref 22–32)
Chloride: 109 mmol/L (ref 101–111)
Creatinine, Ser: 0.51 mg/dL (ref 0.44–1.00)
GFR calc Af Amer: >60 mL/min (ref >60)
GLUCOSE: 86 mg/dL (ref 65–99)
POTASSIUM: 4 mmol/L (ref 3.5–5.1)
Sodium: 143 mmol/L (ref 135–145)

## 2015-04-13 SURGERY — ESOPHAGOGASTRODUODENOSCOPY (EGD) WITH PROPOFOL
Anesthesia: Monitor Anesthesia Care

## 2015-04-13 MED ORDER — PROPOFOL 500 MG/50ML IV EMUL
INTRAVENOUS | Status: DC | PRN
Start: 2015-04-13 — End: 2015-04-13
  Administered 2015-04-13: 300 ug/kg/min via INTRAVENOUS

## 2015-04-13 MED ORDER — SUCRALFATE 1 G PO TABS
1.0000 g | ORAL_TABLET | Freq: Three times a day (TID) | ORAL | Status: DC
  Administered 2015-04-13 – 2015-04-14 (×4): 1 g via ORAL
  Filled 2015-04-13 (×6): qty 1

## 2015-04-13 MED ORDER — PROPOFOL 10 MG/ML IV BOLUS
INTRAVENOUS | Status: AC
  Filled 2015-04-13: qty 40

## 2015-04-13 MED ORDER — ONDANSETRON HCL 4 MG/2ML IJ SOLN
INTRAMUSCULAR | Status: AC
  Filled 2015-04-13: qty 2

## 2015-04-13 MED ORDER — LACTATED RINGERS IV SOLN
INTRAVENOUS | Status: DC
  Administered 2015-04-13: 1000 mL via INTRAVENOUS

## 2015-04-13 MED ORDER — ONDANSETRON HCL 4 MG/2ML IJ SOLN
INTRAMUSCULAR | Status: DC | PRN
  Administered 2015-04-13: 4 mg via INTRAVENOUS

## 2015-04-13 MED ORDER — LACTATED RINGERS IV SOLN
INTRAVENOUS | Status: DC | PRN
  Administered 2015-04-13: 12:00:00 via INTRAVENOUS

## 2015-04-13 MED ORDER — SODIUM CHLORIDE 0.9 % IV SOLN: INTRAVENOUS | Status: DC

## 2015-04-13 MED ORDER — PANTOPRAZOLE SODIUM 40 MG PO TBEC
40.0000 mg | DELAYED_RELEASE_TABLET | Freq: Two times a day (BID) | ORAL | Status: DC
  Administered 2015-04-13 – 2015-04-14 (×2): 40 mg via ORAL
  Filled 2015-04-13 (×4): qty 1

## 2015-04-13 SURGICAL SUPPLY — 15 items

## 2015-04-13 NOTE — Transfer of Care (Signed)
Immediate Anesthesia Transfer of Care Note  Patient: Krista Carter  Procedure(s) Performed: Procedure(s): ESOPHAGOGASTRODUODENOSCOPY (EGD) WITH PROPOFOL (N/A)  Patient Location: PACU  Anesthesia Type:MAC  Level of Consciousness:  sedated, patient cooperative and responds to stimulation  Airway & Oxygen Therapy:Patient Spontanous Breathing and Patient connected to face mask oxgen  Post-op Assessment:  Report given to PACU RN and Post -op Vital signs reviewed and stable  Post vital signs:  Reviewed and stable  Last Vitals:  Filed Vitals:   04/13/15 0442 04/13/15 1056  BP: 110/66 120/88  Pulse: 76 69  Temp: 37 C 37.1 C  Resp: 16 13    Complications: No apparent anesthesia complications

## 2015-04-13 NOTE — Anesthesia Preprocedure Evaluation (Signed)
Anesthesia Evaluation  Patient identified by MRN, date of birth, ID band Patient awake    Reviewed: Allergy & Precautions, NPO status , Patient's Chart, lab work & pertinent test results  Airway Mallampati: II  TM Distance: >3 FB Neck ROM: Full    Dental no notable dental hx.    Pulmonary Current Smoker,    Pulmonary exam normal breath sounds clear to auscultation       Cardiovascular hypertension, Pt. on medications negative cardio ROS Normal cardiovascular exam Rhythm:Regular Rate:Normal     Neuro/Psych negative neurological ROS  negative psych ROS   GI/Hepatic negative GI ROS, Neg liver ROS,   Endo/Other  diabetes, Type 2, Oral Hypoglycemic Agents  Renal/GU negative Renal ROS  negative genitourinary   Musculoskeletal negative musculoskeletal ROS (+)   Abdominal   Peds negative pediatric ROS (+)  Hematology negative hematology ROS (+)   Anesthesia Other Findings   Reproductive/Obstetrics negative OB ROS                             Anesthesia Physical Anesthesia Plan  ASA: II  Anesthesia Plan: MAC   Post-op Pain Management:    Induction:   Airway Management Planned: Natural Airway  Additional Equipment:   Intra-op Plan:   Post-operative Plan:   Informed Consent: I have reviewed the patients History and Physical, chart, labs and discussed the procedure including the risks, benefits and alternatives for the proposed anesthesia with the patient or authorized representative who has indicated his/her understanding and acceptance.   Dental advisory given  Plan Discussed with: CRNA  Anesthesia Plan Comments:         Anesthesia Quick Evaluation

## 2015-04-13 NOTE — Op Note (Signed)
[facility] [place], [id number]   ENDOSCOPY PROCEDURE REPORT  PATIENT: Gavina, Laton  MR#: [id number] BIRTHDATE: [birth year] , 79  yrs. old GENDER: female ENDOSCOPIST: Jerene Bears, MD REFERRED BY:  [facility] PROCEDURE DATE:  04/13/2015 PROCEDURE:  EGD, diagnostic ASA CLASS:     Class III INDICATIONS:  hematemesis and acute post hemorrhagic anemia. MEDICATIONS: Monitored anesthesia care and Per Anesthesia TOPICAL ANESTHETIC: Cetacaine Spray  DESCRIPTION OF PROCEDURE: After the risks benefits and alternatives of the procedure were thoroughly explained, informed consent was obtained.  The Pentax Gastroscope [id number] endoscope was introduced through the mouth and advanced to the second portion of the duodenum , Without limitations.  The instrument was slowly withdrawn as the mucosa was fully examined.  ESOPHAGUS: The mucosa of the esophagus appeared normal.  STOMACH: A 2-3 cm, non-bleeding, round and deep ulcer with a pigmented spot was found in the gastric antrum.   No blood in the stomach.  After consideration the decision made not to treat pigmented spot with gold probe cautery given risk of provoking re-bleeding.  DUODENUM: The duodenal mucosa showed no abnormalities in the bulb and 2nd part of the duodenum.  Retroflexed views revealed no abnormalities.     The scope was then withdrawn from the patient and the procedure completed.  COMPLICATIONS: There were no immediate complications.  ENDOSCOPIC IMPRESSION: 1.   The mucosa of the esophagus appeared normal 2.   Large ulcer was found in the gastric antrum (source of recent UGI bleeding) 3.   The duodenal mucosa showed no abnormalities in the bulb and 2nd part of the duodenum  RECOMMENDATIONS: 1.  BID PPI for at least 12 weeks 2.  Carafate 1 g TID-AC and HS 3.  Check H.  pylori antibody, treat if positive 4.  No NSAIDs 5.  Repeat endoscopy in 12 weeks to ensure healing, sooner  if any evidence of rebleeding  eSigned:  Jerene Bears, MD 04/13/2015 12:35 PM  CC: the patient

## 2015-04-13 NOTE — Anesthesia Postprocedure Evaluation (Signed)
Anesthesia Post Note  Patient: Krista Carter  Procedure(s) Performed: Procedure(s) (LRB): ESOPHAGOGASTRODUODENOSCOPY (EGD) WITH PROPOFOL (N/A)  Patient location during evaluation: Endoscopy Anesthesia Type: MAC Level of consciousness: awake and alert Pain management: pain level controlled Vital Signs Assessment: post-procedure vital signs reviewed and stable Respiratory status: spontaneous breathing, nonlabored ventilation, respiratory function stable and patient connected to nasal cannula oxygen Cardiovascular status: stable and blood pressure returned to baseline Anesthetic complications: no    Last Vitals:  Filed Vitals:   04/13/15 1300 04/13/15 1321  BP: 125/102 125/77  Pulse:  72  Temp:  36.7 C  Resp:  12    Last Pain:  Filed Vitals:   04/13/15 1351  PainSc: 10-Worst pain ever                 Montez Hageman

## 2015-04-13 NOTE — Progress Notes (Signed)
[facility] PROGRESS NOTE  Krista Carter [id number] DOB: 03/22/1956 DOA: 04/11/2015 PCP: Donnie Coffin, MD  Brief narrative 60 year old female with invasive hypertension, anxiety,  peptic ulcer disease and treated with syncope secondary to GI bleed. She was also found to have a left fibular fracture.  Assessment/Plan:  Upper GI bleed EGD done today showed large ulcer in the gastric antrum which is a source of her upper GI bleed. GI recommends PPI twice a day for at least [redacted] weeks along with Carafate 1 g 3 times a day and before meals and at bedtime. Check H. pylori antibody. Recommend to avoid NSAIDs. She will need repeat endoscopy in 12 weeks to ensure healing. Hemoglobin stable at 8.3.  Syncope Appears to be secondary to GI bleed. 2-D echo with normal EF.   Fall with left proximal fibular fracture. Seen by orthopedics and recommend further imaging as outpatient 1 week. Nonweightbearing to left leg. Pain control. Ordered PT/OT. Ice and leg elevation  Hypertension Blood pressure soft. Decreased metoprolol dose and holding lisinopril.  Anxiety On chronic scheduled Klonopin. Patient tearful returning from EGD stating that her son did not come to visit her.  Diet: Regular DVT prophylaxis: SCD's  Code Status: Full code Family Communication: None at bedside Disposition Plan: Currently inpatient. Pending PT evaluation   Consultants:  Orthopedics  [facility]  Procedures:  EGD  2-D echo  Antibiotics:  None  HPI/Subjective: And examined after returning from EGD. Patient tearful.  Objective: Filed Vitals:   04/13/15 1300 04/13/15 1321  BP: 125/102 125/77  Pulse:  72  Temp:  98.1 F (36.7 C)  Resp:  12    Intake/Output Summary (Last 24 hours) at 04/13/15 1538 Last data filed at 04/13/15 1233  Gross per 24 hour  Intake   2195 ml  Output      0 ml  Net   2195 ml   Filed Weights   04/12/15 0135  Weight: 59.5 kg (131 lb 2.8 oz)     Exam:   General:  Elderly female not in distress, tearful  HEENT: Pallor present, moist mucosa   chest: Clear bilaterally  CVS: Normal S1 and S2, no murmurs  Abdomen: Soft, nondistended, nontender, bowel sounds  Musculoskeletal: Tenderness of left leg  CNS: Alert and oriented  Data Reviewed: Basic Metabolic Panel:  Recent Labs Lab 04/11/15 1347 04/12/15 0140 04/13/15 0433  NA 143 142 143  K 3.5 3.5 4.0  CL 103 107 109  CO2 27 28 29   GLUCOSE 141* 91 86  BUN 15 35* 16  CREATININE 0.83 0.67 0.51  CALCIUM 8.8* 8.4* 8.1*   Liver Function Tests:  Recent Labs Lab 04/11/15 1512  AST 14*  ALT 9*  ALKPHOS 72  BILITOT 0.4  PROT 6.1*  ALBUMIN 3.0*    Recent Labs Lab 04/11/15 1512  LIPASE 20   No results for input(s): AMMONIA in the last 168 hours. CBC:  Recent Labs Lab 04/11/15 1347 04/12/15 0140 04/12/15 0755 04/12/15 1915 04/13/15 0433  WBC 12.6* 11.6*  --   --  7.6  HGB 12.2 9.7* 9.2* 8.4* 8.3*  HCT 37.0 29.4* 28.5* 25.9* 25.9*  MCV 98.1 97.7  --   --  100.4*  PLT 278 237  --   --  195   Cardiac Enzymes:  Recent Labs Lab 04/11/15 1512  TROPONINI <0.03   BNP (last 3 results) No results for input(s): BNP in the last 8760 hours.  ProBNP (last 3 results) No results for input(s): PROBNP  in the last 8760 hours.  CBG:  Recent Labs Lab 04/11/15 1402  GLUCAP 116*    No results found for this or any previous visit (from the past 240 hour(s)).   Studies: Dg Chest 2 View  04/11/2015  CLINICAL DATA:  Syncope, left leg injury EXAM: CHEST  2 VIEW COMPARISON:  11/26/2013 FINDINGS: Cardiomediastinal silhouette is stable. No acute infiltrate or pleural effusion. No pulmonary edema. Metallic fixation plate and screws noted in right humerus. IMPRESSION: No active cardiopulmonary disease. Electronically Signed   By: Lahoma Crocker M.D.   On: 04/11/2015 15:56   Dg Tibia/fibula Left  04/11/2015  CLINICAL DATA:  Fall this morning, left lower leg pain  EXAM: LEFT TIBIA AND FIBULA - 2 VIEW COMPARISON:  None. FINDINGS: Three views of the left tibia fibula submitted. Degenerative changes are noted left knee joint with significant narrowing of medial joint compartment and spurring of medial femoral condyle and medial tibial plateau. There is mild displaced oblique fracture in proximal shaft of left fibula best seen on lateral view. IMPRESSION: Mild displaced oblique fracture proximal shaft of left fibula best seen on lateral view. Extensive degenerative changes left knee joint. Electronically Signed   By: Lahoma Crocker M.D.   On: 04/11/2015 15:55   Dg Ankle Complete Left  04/11/2015  CLINICAL DATA:  Status post fall.  Pain. EXAM: LEFT ANKLE COMPLETE - 3+ VIEW COMPARISON:  None. FINDINGS: There is a small ossific fragment adjacent to the lateral aspect of the distal tibial epiphysis at the distal tibiofibular joint concerning for a small fractured of indeterminate age. There is a small plantar calcaneal spur. Soft tissues are unremarkable. IMPRESSION: 1. Small ossific fragment adjacent to the lateral aspect of the distal tibial epiphysis at the distal tibiofibular joint concerning for a small fractured of indeterminate age. Electronically Signed   By: Kathreen Devoid   On: 04/11/2015 15:59    Scheduled Meds: . clonazePAM  0.5 mg Oral BID  . metoprolol succinate  50 mg Oral Daily  . pantoprazole  40 mg Oral BID AC  . sodium chloride  3 mL Intravenous Q12H  . sucralfate  1 g Oral TID WC & HS   Continuous Infusions: . sodium chloride 75 mL/hr at 04/13/15 1325      Time spent: 25 minutes    Zamoria Boss  [facility] Pager [phone] If 7PM-7AM, please contact night-coverage at [email or web address], password [facility] 04/13/2015, 3:38 PM  LOS: 2 days

## 2015-04-14 ENCOUNTER — Encounter (HOSPITAL_COMMUNITY): Payer: Self-pay | Admitting: Internal Medicine

## 2015-04-14 DIAGNOSIS — K254 Chronic or unspecified gastric ulcer with hemorrhage: Secondary | ICD-10-CM | POA: Diagnosis present

## 2015-04-14 DIAGNOSIS — K922 Gastrointestinal hemorrhage, unspecified: Secondary | ICD-10-CM | POA: Diagnosis present

## 2015-04-14 DIAGNOSIS — R55 Syncope and collapse: Secondary | ICD-10-CM

## 2015-04-14 DIAGNOSIS — K250 Acute gastric ulcer with hemorrhage: Principal | ICD-10-CM

## 2015-04-14 DIAGNOSIS — S82402A Unspecified fracture of shaft of left fibula, initial encounter for closed fracture: Secondary | ICD-10-CM | POA: Diagnosis present

## 2015-04-14 DIAGNOSIS — F17200 Nicotine dependence, unspecified, uncomplicated: Secondary | ICD-10-CM

## 2015-04-14 LAB — CBC
HCT: 25.3 % — ABNORMAL LOW (ref 36.0–46.0)
HEMOGLOBIN: 8.3 g/dL — AB (ref 12.0–15.0)
MCH: 32.4 pg (ref 26.0–34.0)
MCHC: 32.8 g/dL (ref 30.0–36.0)
MCV: 98.8 fL (ref 78.0–100.0)
PLATELETS: 197 10*3/uL (ref 150–400)
RBC: 2.56 MIL/uL — AB (ref 3.87–5.11)
RDW: 13.3 % (ref 11.5–15.5)
WBC: 6 10*3/uL (ref 4.0–10.5)

## 2015-04-14 MED ORDER — PANTOPRAZOLE SODIUM 40 MG PO TBEC: 40.0000 mg | DELAYED_RELEASE_TABLET | Freq: Two times a day (BID) | ORAL | Status: DC

## 2015-04-14 MED ORDER — NICOTINE 21 MG/24HR TD PT24: 21.0000 mg | MEDICATED_PATCH | Freq: Every day | TRANSDERMAL | Status: DC

## 2015-04-14 MED ORDER — SUCRALFATE 1 G PO TABS: 1.0000 g | ORAL_TABLET | Freq: Three times a day (TID) | ORAL | Status: DC

## 2015-04-14 MED ORDER — OXYCODONE HCL 5 MG PO TABS
5.0000 mg | ORAL_TABLET | ORAL | Status: DC | PRN
Start: 2015-04-14 — End: 2021-10-07

## 2015-04-14 NOTE — Discharge Summary (Signed)
Physician Discharge Summary  Krista Carter [id number] DOB: 11-Oct-1955 DOA: 04/11/2015  PCP: Donnie Coffin, MD  Admit date: 04/11/2015 Discharge date: 04/14/2015  Time spent:35  minutes  Recommendations for Outpatient Follow-up:  1. Discharge home with outpatient follow-up with PCP in 1 week. Recheck hemoglobin and results of H. pylori. 2. Follow-up with orthopedics Dr. Marcelino Scot in 1 week. 3. Patient needs to be on Protonix twice a day for at least 12 weeks and Carafate 3 times a day before meals and at bedtime.   Discharge Diagnoses:  Principal Problem:   Gastric ulcer with hemorrhage   Active Problems:   Acute gastric ulcer   Tobacco abuse   Protein-calorie malnutrition, severe (HCC)   Syncope   History of peptic ulcer disease   Emesis   Hematemesis with nausea   Acute upper GI bleed   Closed left fibular fracture   Discharge Condition: Fair  Diet recommendation: Soft  Filed Weights   04/12/15 0135  Weight: 59.5 kg (131 lb 2.8 oz)    History of present illness:  Please refer to admission H&P for details, in brief,60 year old female with  hypertension, anxiety, peptic ulcer disease and heavy smoker presented to the ED with syncope secondary to GI bleed. She was also found to have a left fibular fracture. Patient admitted to telemetry. Seen by GI and underwent EGD showing large gastric ulcer.  Hospital Course:  Upper GI bleed EGD done on 1/11 showed large ulcer in the gastric antrum which is a source of her upper GI bleed. Hemoglobin dropped to as low as 8.3 but did not require transfusion. This is likely in the setting of ongoing heavy smoking, use of significant amount of woody powders and NSAIDs for pain and continued alcohol use. Patient also not taking PPI despite history of recurrent ulcers. GI recommends PPI twice a day for at least [redacted] weeks along with Carafate 1 g 3 times a day and before meals and at bedtime. EGD with biopsy done and sent for H. pylori which should  be followed up as outpatient.s. She will need repeat endoscopy in 12 weeks to ensure healing. Hemoglobin is stable at 8.3. Patient strongly counseled on smoking cessation and avoid using NSAIDs or Goody powders. Also counseled on quitting alcohol. I have prescribed her with nicotine patch, PPI and Carafate. -She should follow-up with her PCP in one week and repeat hemoglobin.  Syncope Appears to be secondary to GI bleed. 2-D echo with normal EF. Stable.   Fall with left proximal fibular fracture. Seen by orthopedics and recommend further imaging as outpatient 1 week. Nonweightbearing to left leg. Pain control. Seen by PT and recommends rolling walker .Marland Kitchen Ice and leg elevation.    Hypertension Blood pressure soft on admission due to GI bleed and medications adjusted. Resumed upon discharge.  Anxiety On chronic scheduled Klonopin.     Code Status: Full code Family Communication: None at bedside Disposition Plan: Home with outpatient follow-up   Consultants:  Orthopedics (Dr. Marcelino Scot)  [facility]  Procedures:  EGD  2-D echo  Antibiotics:  None  Discharge Exam: Filed Vitals:   04/13/15 2135 04/14/15 0543  BP: 100/61 117/75  Pulse: 68 71  Temp: 98.5 F (36.9 C) 98.5 F (36.9 C)  Resp: 14 16    General: Middle aged female not in distress HEENT: Pallor present, moist mucosa, supple neck Chest: Clear bilaterally CVS: Normal S1 and S2, no murmurs rub or gallop GI: Soft, nondistended, nontender, bowel sounds present  Musculoskeletal : Dressing  over left ankle   Discharge Instructions    Current Discharge Medication List    START taking these medications   Details  nicotine (NICODERM CQ) 21 mg/24hr patch Place 1 patch (21 mg total) onto the skin daily. Qty: 28 patch, Refills: 0    oxyCODONE (OXY IR/ROXICODONE) 5 MG immediate release tablet Take 1 tablet (5 mg total) by mouth every 4 (four) hours as needed for moderate pain. Qty: 30 tablet, Refills: 0     sucralfate (CARAFATE) 1 g tablet Take 1 tablet (1 g total) by mouth 4 (four) times daily -  with meals and at bedtime. Qty: 120 tablet, Refills: 0      CONTINUE these medications which have CHANGED   Details  pantoprazole (PROTONIX) 40 MG tablet Take 1 tablet (40 mg total) by mouth 2 (two) times daily. Qty: 60 tablet, Refills: 0      CONTINUE these medications which have NOT CHANGED   Details  aspirin EC 81 MG tablet Take 81 mg by mouth daily.      clonazePAM (KLONOPIN) 0.5 MG tablet Take 0.5 mg by mouth 2 (two) times daily as needed. Anxiety    lisinopril (PRINIVIL,ZESTRIL) 10 MG tablet Take 10 mg by mouth daily.    metoprolol succinate (TOPROL-XL) 100 MG 24 hr tablet Take 100 mg by mouth daily.      STOP taking these medications     famotidine (PEPCID) 20 MG tablet      loperamide (IMODIUM A-D) 2 MG tablet        No Known Allergies Follow-up Information    Follow up with Donnie Coffin, MD. Schedule an appointment as soon as possible for a visit in 1 week.   Specialty:  Family Medicine   Contact information:   [address] [place] [address] [phone]       Follow up with PYRTLE, Lajuan Lines, MD In 3 months.   Specialty:  Gastroenterology   Why:  office will call   Contact information:   [address] [place] [address] [phone]       Follow up with Rozanna Box, MD. Call in 1 week.   Specialty:  Orthopedic Surgery   Contact information:   [address] [place] [address] [phone]        The results of significant diagnostics from this hospitalization (including imaging, microbiology, ancillary and laboratory) are listed below for reference.    Significant Diagnostic Studies: Dg Chest 2 View  04/11/2015  CLINICAL DATA:  Syncope, left leg injury EXAM: CHEST  2 VIEW COMPARISON:  11/26/2013 FINDINGS: Cardiomediastinal silhouette is stable. No acute infiltrate or pleural effusion. No pulmonary edema.  Metallic fixation plate and screws noted in right humerus. IMPRESSION: No active cardiopulmonary disease. Electronically Signed   By: Lahoma Crocker M.D.   On: 04/11/2015 15:56   Dg Tibia/fibula Left  04/11/2015  CLINICAL DATA:  Fall this morning, left lower leg pain EXAM: LEFT TIBIA AND FIBULA - 2 VIEW COMPARISON:  None. FINDINGS: Three views of the left tibia fibula submitted. Degenerative changes are noted left knee joint with significant narrowing of medial joint compartment and spurring of medial femoral condyle and medial tibial plateau. There is mild displaced oblique fracture in proximal shaft of left fibula best seen on lateral view. IMPRESSION: Mild displaced oblique fracture proximal shaft of left fibula best seen on lateral view. Extensive degenerative changes left knee joint. Electronically Signed   By: Orlean Bradford.D.  On: 04/11/2015 15:55   Dg Ankle Complete Left  04/11/2015  CLINICAL DATA:  Status post fall.  Pain. EXAM: LEFT ANKLE COMPLETE - 3+ VIEW COMPARISON:  None. FINDINGS: There is a small ossific fragment adjacent to the lateral aspect of the distal tibial epiphysis at the distal tibiofibular joint concerning for a small fractured of indeterminate age. There is a small plantar calcaneal spur. Soft tissues are unremarkable. IMPRESSION: 1. Small ossific fragment adjacent to the lateral aspect of the distal tibial epiphysis at the distal tibiofibular joint concerning for a small fractured of indeterminate age. Electronically Signed   By: Kathreen Devoid   On: 04/11/2015 15:59    Microbiology: No results found for this or any previous visit (from the past 240 hour(s)).   Labs: Basic Metabolic Panel:  Recent Labs Lab 04/11/15 1347 04/12/15 0140 04/13/15 0433  NA 143 142 143  K 3.5 3.5 4.0  CL 103 107 109  CO2 27 28 29   GLUCOSE 141* 91 86  BUN 15 35* 16  CREATININE 0.83 0.67 0.51  CALCIUM 8.8* 8.4* 8.1*   Liver Function Tests:  Recent Labs Lab 04/11/15 1512  AST 14*  ALT  9*  ALKPHOS 72  BILITOT 0.4  PROT 6.1*  ALBUMIN 3.0*    Recent Labs Lab 04/11/15 1512  LIPASE 20   No results for input(s): AMMONIA in the last 168 hours. CBC:  Recent Labs Lab 04/11/15 1347 04/12/15 0140 04/12/15 0755 04/12/15 1915 04/13/15 0433 04/14/15 0507  WBC 12.6* 11.6*  --   --  7.6 6.0  HGB 12.2 9.7* 9.2* 8.4* 8.3* 8.3*  HCT 37.0 29.4* 28.5* 25.9* 25.9* 25.3*  MCV 98.1 97.7  --   --  100.4* 98.8  PLT 278 237  --   --  195 197   Cardiac Enzymes:  Recent Labs Lab 04/11/15 1512  TROPONINI <0.03   BNP: BNP (last 3 results) No results for input(s): BNP in the last 8760 hours.  ProBNP (last 3 results) No results for input(s): PROBNP in the last 8760 hours.  CBG:  Recent Labs Lab 04/11/15 1402  GLUCAP 116*       Signed:  Louellen Molder MD.  [facility] 04/14/2015, 10:15 AM

## 2015-04-14 NOTE — Discharge Instructions (Signed)
Peptic Ulcer °A peptic ulcer is a painful sore in the lining of your esophagus, stomach, or in the first part of your small intestine. The main causes of an ulcer can be: °· An infection. °· Using certain pain medicines too often or too much. °· Smoking. °HOME CARE °· Avoid smoking, alcohol, and caffeine. °· Avoid foods that bother you. °· Only take medicine as told by your doctor. Do not take any medicines your doctor has not approved. °· Keep all doctor visits as told. °GET HELP IF: °· You do not get better in 7 days after starting treatment. °· You keep having an upset stomach (indigestion) or heartburn. °GET HELP RIGHT AWAY IF: °· You have sudden, sharp, or lasting belly (abdominal) pain. °· You have bloody, black, or tarry poop (stool). °· You throw up (vomit) blood or your throw up looks like coffee grounds. °· You get light-headed, weak, or feel like you will pass out (faint). °· You get sweaty or feel sticky and cold to the touch (clammy). °MAKE SURE YOU:  °· Understand these instructions. °· Will watch your condition. °· Will get help right away if you are not doing well or get worse. °  °This information is not intended to replace advice given to you by your health care provider. Make sure you discuss any questions you have with your health care provider. °  °Document Released: 06/13/2009 Document Revised: 04/09/2014 Document Reviewed: 10/17/2011 °Elsevier Interactive Patient Education ©2016 [employer] Inc. ° °

## 2015-04-14 NOTE — Progress Notes (Signed)
     [facility] Progress Note  Subjective:  Feeling well.  Tolerating diet.  Hgb stable.  Ready to go home.  Objective:  Vital signs in last 24 hours: Temp:  [98.1 F (36.7 C)-98.8 F (37.1 C)] 98.5 F (36.9 C) (01/12 0543) Pulse Rate:  [67-72] 71 (01/12 0543) Resp:  [11-16] 16 (01/12 0543) BP: (100-125)/(58-102) 117/75 mmHg (01/12 0543) SpO2:  [96 %-100 %] 97 % (01/12 0543)   General:  Alert, Well-developed, in NAD Heart:  Regular rate and rhythm; no murmurs Pulm:  CTAB.  No W/R/R. Abdomen:  Soft, non-distended.  BS present.  Non-tender.  Extremities:  Without edema. Neurologic:  Alert and oriented x 4;  grossly normal neurologically. Psych:  Alert and cooperative. Normal mood and affect.  Intake/Output from previous day: 01/11 0701 - 01/12 0700 In: 1033.8 [P.O.:195; I.V.:838.8] Out: -   Lab Results:  Recent Labs  04/12/15 0140  04/12/15 1915 04/13/15 0433 04/14/15 0507  WBC 11.6*  --   --  7.6 6.0  HGB 9.7*  < > 8.4* 8.3* 8.3*  HCT 29.4*  < > 25.9* 25.9* 25.3*  PLT 237  --   --  195 197  < > = values in this interval not displayed. BMET  Recent Labs  04/11/15 1347 04/12/15 0140 04/13/15 0433  NA 143 142 143  K 3.5 3.5 4.0  CL 103 107 109  CO2 27 28 29   GLUCOSE 141* 91 86  BUN 15 35* 16  CREATININE 0.83 0.67 0.51  CALCIUM 8.8* 8.4* 8.1*   LFT  Recent Labs  04/11/15 1512  PROT 6.1*  ALBUMIN 3.0*  AST 14*  ALT 9*  ALKPHOS 72  BILITOT 0.4  BILIDIR 0.2  IBILI 0.2*   PT/INR  Recent Labs  04/11/15 1512  LABPROT 14.0  INR 1.06   Assessment / Plan: -Large GU seen on EGD 1/11:  This is the cause of UGI bleeding and likely from [place] powders that she has been using.  Needs to continue BID PPI for at least 12 weeks.  Carafate ACHS.  Hpylori Ab pending and if positive then will treat.  Needs to avoid NSAID's.  Needs repeat EGD in 12 weeks to ensure healing.  Hgb has been stable.  [place] for discharge today from GI standpoint.   LOS:  3 days   Vale Mousseau D.  04/14/2015, 9:22 AM  Pager number [phone]

## 2015-04-14 NOTE — Progress Notes (Signed)
Patient discharged home- [facility] and walker received and sent home with patient. Discharge paperwork explained & patient verbalized understanding. Printed prescriptions sent with patient. Patient discharged with sons and assisted to vehicle by W/C and staff. Patient tolerated transfer well.

## 2015-04-14 NOTE — Care Management Note (Signed)
Case Management Note  Patient Details  Name: Krista Carter MRN: [id number] Date of Birth: 05/09/1955  Subjective/Objective:   Pt recc-home rw, 3n1. [facility] dme rep aware of home rw,3n1 orderPatient will have family to transp home.                 Action/Plan:d/c home.   Expected Discharge Date:   (unknown)               Expected Discharge Plan:  Home/Self Care  In-House Referral:     Discharge planning Services  CM Consult  Post Acute Care Choice:    Choice offered to:     DME Arranged:  3-N-1, Walker rolling DME Agency:  [place]:    [place]:     Status of Service:  Completed, signed off  Medicare Important Message Given:    Date Medicare IM Given:    Medicare IM give by:    Date Additional Medicare IM Given:    Additional Medicare Important Message give by:     If discussed at [place] of Stay Meetings, dates discussed:    Additional Comments:  Dessa Phi, RN 04/14/2015, 10:53 AM

## 2015-04-14 NOTE — Evaluation (Signed)
Physical Therapy Evaluation Patient Details Name: Krista Carter MRN: [id number] DOB: 1955/05/08 Today's Date: 04/14/2015   History of Present Illness  60 year old female with hypertension, anxiety, peptic ulcer disease and heavy smoker presented to the ED with syncope secondary to GI bleed. She was also found to have a left fibular fracture and now L LE NWB.  EGD showing large gastric ulcer.  Clinical Impression  Pt admitted with above diagnosis. Pt currently with functional limitations due to the deficits listed below (see PT Problem List).  Pt states she plans to d/c home with son.  His home has a few steps to enter so she will check with him about assist up steps upon d/c.  May need ambulance transport home if no assist available.  Recommended RW and BSC upon d/c.  Pt educated on elevating L LE above heart and wiggling toes.  Pt with likely d/c home today however if remains in acute, will assist with mobility.  Pt will benefit from skilled PT to increase their independence and safety with mobility to allow discharge to the venue listed below.  Recommend possible PT pending f/u ortho visit.     Follow Up Recommendations No PT follow up (would recommend PT after f/u with ortho)    Equipment Recommendations  Rolling walker with 5" wheels;3in1 (PT)    Recommendations for Other Services       Precautions / Restrictions Precautions Precautions: Fall Restrictions Weight Bearing Restrictions: Yes LLE Weight Bearing: Non weight bearing      Mobility  Bed Mobility Overal bed mobility: Needs Assistance Bed Mobility: Supine to Sit     Supine to sit: Supervision        Transfers Overall transfer level: Needs assistance Equipment used: Rolling walker (2 wheeled) Transfers: Sit to/from Stand Sit to Stand: Min guard         General transfer comment: verbal cues for hand placement, educated on NWB status, dizzy with standing however resolved within 1-2  minutes  Ambulation/Gait Ambulation/Gait assistance: Min guard Ambulation Distance (Feet): 40 Feet Assistive device: Rolling walker (2 wheeled)       General Gait Details: pt educated on using RW for NWB status L LE, maintains status well, distance limited by fatigue and weakness (states she hasn't been OOB since admission)  Stairs            Wheelchair Mobility    Modified Rankin (Stroke Patients Only)       Balance Overall balance assessment: Needs assistance;History of Falls         Standing balance support: Bilateral upper extremity supported Standing balance-Leahy Scale: Poor Standing balance comment: requires UE support at this time due to NWB status                             Pertinent Vitals/Pain Pain Assessment: 0-10 Pain Score: 4  Pain Location: L ankle Pain Descriptors / Indicators: Aching;Sore Pain Intervention(s): Limited activity within patient's tolerance;Monitored during session;Premedicated before session;Repositioned (elevated)    Home Living Family/patient expects to be discharged to:: Private residence Living Arrangements: Alone Available Help at Discharge: Family (plans to d/c to son's home) Type of Home: House Home Access: Stairs to enter Entrance Stairs-Rails: None Entrance Stairs-Number of Steps: 3-4 Home Layout: One level Home Equipment: None      Prior Function Level of Independence: Independent               Hand Dominance  Extremity/Trunk Assessment   Upper Extremity Assessment: Generalized weakness           Lower Extremity Assessment: LLE deficits/detail;Generalized weakness   LLE Deficits / Details: L short leg splint, able to wiggle toes     Communication   Communication: No difficulties  Cognition Arousal/Alertness: Awake/alert Behavior During Therapy: WFL for tasks assessed/performed Overall Cognitive Status: Within Functional Limits for tasks assessed                       General Comments      Exercises        Assessment/Plan    PT Assessment Patient needs continued PT services  PT Diagnosis Acute pain;Difficulty walking   PT Problem List Decreased strength;Decreased activity tolerance;Decreased mobility;Decreased balance;Decreased knowledge of use of DME;Pain  PT Treatment Interventions DME instruction;Gait training;Functional mobility training;Patient/family education;Stair training;Therapeutic activities;Therapeutic exercise   PT Goals (Current goals can be found in the Care Plan section) Acute Rehab PT Goals PT Goal Formulation: With patient Time For Goal Achievement: 04/21/15 Potential to Achieve Goals: Good    Frequency Min 3X/week   Barriers to discharge        Co-evaluation               End of Session Equipment Utilized During Treatment: Gait belt Activity Tolerance: Patient limited by fatigue Patient left: in chair;with call bell/phone within reach;with chair alarm set Nurse Communication: Mobility status         Time: [id number] PT Time Calculation (min) (ACUTE ONLY): 14 min   Charges:   PT Evaluation $PT Eval Low Complexity: 1 Procedure     PT G Codes:        Zacheriah Stumpe,KATHrine E 04/14/2015, 11:51 AM Krista Carter, PT, DPT 04/14/2015 Pager: [phone]

## 2015-04-15 LAB — H. PYLORI ANTIBODY, IGG: H Pylori IgG: <0.9 U/mL (ref 0.0–0.8)

## 2015-06-03 ENCOUNTER — Encounter: Payer: Self-pay | Admitting: Internal Medicine

## 2016-05-18 ENCOUNTER — Emergency Department (HOSPITAL_COMMUNITY)
Admission: EM | Admit: 2016-05-18 | Discharge: 2016-05-18 | Disposition: A | Discharge status: Home / Self Care | Payer: BC Managed Care – PPO | Attending: Emergency Medicine | Admitting: Emergency Medicine

## 2016-05-18 ENCOUNTER — Encounter (HOSPITAL_COMMUNITY): Payer: Self-pay | Admitting: Emergency Medicine

## 2016-05-18 DIAGNOSIS — R42 Dizziness and giddiness: Secondary | ICD-10-CM | POA: Diagnosis present

## 2016-05-18 DIAGNOSIS — E119 Type 2 diabetes mellitus without complications: Secondary | ICD-10-CM | POA: Diagnosis not present

## 2016-05-18 DIAGNOSIS — Z79899 Other long term (current) drug therapy: Secondary | ICD-10-CM | POA: Diagnosis not present

## 2016-05-18 DIAGNOSIS — H8110 Benign paroxysmal vertigo, unspecified ear: Secondary | ICD-10-CM | POA: Diagnosis not present

## 2016-05-18 DIAGNOSIS — F17210 Nicotine dependence, cigarettes, uncomplicated: Secondary | ICD-10-CM | POA: Diagnosis not present

## 2016-05-18 DIAGNOSIS — Z7982 Long term (current) use of aspirin: Secondary | ICD-10-CM | POA: Insufficient documentation

## 2016-05-18 DIAGNOSIS — I10 Essential (primary) hypertension: Secondary | ICD-10-CM | POA: Diagnosis not present

## 2016-05-18 LAB — CBC WITH DIFFERENTIAL/PLATELET
Basophils Absolute: 0 10*3/uL (ref 0.0–0.1)
Basophils Relative: 1 %
EOS PCT: 2 %
Eosinophils Absolute: 0.1 10*3/uL (ref 0.0–0.7)
HCT: 37.9 % (ref 36.0–46.0)
Hemoglobin: 12.4 g/dL (ref 12.0–15.0)
LYMPHS ABS: 1.9 10*3/uL (ref 0.7–4.0)
LYMPHS PCT: 30 %
MCH: 31.2 pg (ref 26.0–34.0)
MCHC: 32.7 g/dL (ref 30.0–36.0)
MCV: 95.5 fL (ref 78.0–100.0)
MONOS PCT: 6 %
Monocytes Absolute: 0.4 10*3/uL (ref 0.1–1.0)
Neutro Abs: 4 10*3/uL (ref 1.7–7.7)
Neutrophils Relative %: 61 %
PLATELETS: 217 10*3/uL (ref 150–400)
RBC: 3.97 MIL/uL (ref 3.87–5.11)
RDW: 15 % (ref 11.5–15.5)
WBC: 6.4 10*3/uL (ref 4.0–10.5)

## 2016-05-18 LAB — BASIC METABOLIC PANEL
Anion gap: 9 (ref 5–15)
BUN: 11 mg/dL (ref 6–20)
CO2: 23 mmol/L (ref 22–32)
Calcium: 8.8 mg/dL — ABNORMAL LOW (ref 8.9–10.3)
Chloride: 109 mmol/L (ref 101–111)
Creatinine, Ser: 0.75 mg/dL (ref 0.44–1.00)
GFR calc Af Amer: >60 mL/min (ref >60)
GFR calc non Af Amer: >60 mL/min (ref >60)
Glucose, Bld: 90 mg/dL (ref 65–99)
POTASSIUM: 3.8 mmol/L (ref 3.5–5.1)
Sodium: 141 mmol/L (ref 135–145)

## 2016-05-18 MED ORDER — MECLIZINE HCL 25 MG PO TABS: 25.0000 mg | ORAL_TABLET | Freq: Three times a day (TID) | ORAL | 0 refills | Status: DC | PRN

## 2016-05-18 NOTE — ED Notes (Signed)
Pt. Denies any dizziness since arrival to the Room

## 2016-05-18 NOTE — ED Provider Notes (Signed)
[facility] DEPT Provider Note   CSN: [id number] Arrival date & time: 05/18/16  0751     History   Chief Complaint Chief Complaint  Patient presents with  . Dizziness    HPI Krista Carter is a 61 y.o. female.  HPI  61 year old female presents with multiple episodes of dizziness. She describes as a room spinning sensation. Started 2 nights ago and occurred then, last night, and this morning. All of these have occurred while in bed and when she rolls over in bed. The spinning feels like the whole room is spinning and last about 5 or 10 minutes. Some nausea associated with it. Currently feels asymptomatic. She endorses bilateral ear ringing for 1 month, feels like an ocean in her ear. Occasional sharp pains but none now. She has had decreased hearing during this time. Also endorses on and off headaches for one month, most recently one week ago. Her PCP is working her up for this. Chronic sinus troubles are not bothering her now. No current chest pain or shortness of breath. Chronic diarrhea that is unchanged. No weakness or numbness.  Past Medical History:  Diagnosis Date  . Adrenal adenoma 02/2015   left, seen on CT.   Marland Kitchen Anemia 07/2010, 04/2015   normocytic.   Marland Kitchen Depression with anxiety 2008  . Diabetes mellitus 2011   type 2.   . Duodenitis 2005   [place] also seen on EGD.   Marland Kitchen Dyslipidemia 2011  . Expressive aphasia 03/2010   subjective word finding difficulty and slurred speach.  MRI/MRA, CT scan: mild small vessel disease, mild narrowing bilateral int carotids and RCA  . Hydronephrosis 2010   per ultrasound: mild, chronic, non-obstructing, bilateral.  . Hypertension 2011  . IBS (irritable bowel syndrome) 2008   diarrhea prone  . Mitral valve regurgitation 03/2010   Mild MVR, EF 60-65% per echo.  . Multiple gastric ulcers 09/2013   Benign, due to ASA powders.  H Pylori negative.    . Renal insufficiency 2014   AKI 10/2012 due to dehydration, acute (probably infectious/viral)  colitis, ACE use.   . Severe protein-calorie malnutrition ([place]) 10/2012  . Tibia/fibula fracture 04/2015   left.  Dital tibia, proximal fibula.     Patient Active Problem List   Diagnosis Date Noted  . Acute upper GI bleed 04/14/2015  . Gastric ulcer with hemorrhage 04/14/2015  . Closed left fibular fracture 04/14/2015  . Faintness   . Acute gastric ulcer   . History of peptic ulcer disease   . Emesis   . Hematemesis with nausea   . Syncope 04/11/2015  . Dizziness 11/27/2013  . Chest pain 11/27/2013  . AKI (acute kidney injury) ([place]) 11/27/2013  . Dehydration 11/27/2013  . Hypotension 11/27/2013  . Loss of weight 08/17/2013  . Diarrhea 08/17/2013  . Nausea alone 08/17/2013  . Generalized abdominal pain 08/17/2013  . Protein-calorie malnutrition, severe ([place]) 11/26/2012  . Nausea vomiting and diarrhea 11/25/2012  . Hypokalemia 11/25/2012  . Acute renal failure ([place]) 11/25/2012  . APNEA 02/17/2010  . SNORING 02/17/2010  . OTH SPEC PERS HX PRESENTING HAZARDS HEALTH OTH 02/17/2010  . HYPERLIPIDEMIA 06/10/2007  . SMOKER 06/10/2007  . HYPERTENSION 06/10/2007  . GERD 06/10/2007  . IBS 06/10/2007  . RENAL CALCULUS 06/10/2007  . DUODENITIS 04/06/2003  . HIATAL HERNIA 04/06/2003    Past Surgical History:  Procedure Laterality Date  . ABDOMINAL HYSTERECTOMY    . BONE MARROW BIOPSY  07/2010.    Benign fibroconnective and soft tissue  with abundant acute and chronic inflammation, with foreign body multinucleated giant cell reaction. No atypia, no malignancy  . colonoscopies  2008, 09/2013   both normal.   . ESOPHAGOGASTRODUODENOSCOPY  2005, 09/2013, 10/2013  . ESOPHAGOGASTRODUODENOSCOPY (EGD) WITH PROPOFOL N/A 04/13/2015   Procedure: ESOPHAGOGASTRODUODENOSCOPY (EGD) WITH PROPOFOL;  Surgeon: Jerene Bears, MD;  Location: [facility];  Service: Endoscopy;  Laterality: N/A;  . ORIF NONUNION HUMERUS FRACTURE  07/2010   with bone graft.     OB History    No data available        Home Medications    Prior to Admission medications   Medication Sig Start Date End Date Taking? Authorizing Provider  aspirin EC 81 MG tablet Take 81 mg by mouth daily.      Historical Provider, MD  clonazePAM (KLONOPIN) 0.5 MG tablet Take 0.5 mg by mouth 2 (two) times daily as needed. Anxiety 02/01/15   Historical Provider, MD  lisinopril (PRINIVIL,ZESTRIL) 10 MG tablet Take 10 mg by mouth daily.    Historical Provider, MD  meclizine (ANTIVERT) 25 MG tablet Take 1 tablet (25 mg total) by mouth 3 (three) times daily as needed for dizziness. 05/18/16   Sherwood Gambler, MD  metoprolol succinate (TOPROL-XL) 100 MG 24 hr tablet Take 100 mg by mouth daily. 02/02/15   Historical Provider, MD  nicotine (NICODERM CQ) 21 mg/24hr patch Place 1 patch (21 mg total) onto the skin daily. 04/14/15   [name] Dhungel, MD  oxyCODONE (OXY IR/ROXICODONE) 5 MG immediate release tablet Take 1 tablet (5 mg total) by mouth every 4 (four) hours as needed for moderate pain. 04/14/15   [name] Dhungel, MD  pantoprazole (PROTONIX) 40 MG tablet Take 1 tablet (40 mg total) by mouth 2 (two) times daily. 04/14/15   [name] Dhungel, MD  sucralfate (CARAFATE) 1 g tablet Take 1 tablet (1 g total) by mouth 4 (four) times daily -  with meals and at bedtime. 04/14/15   [name] Dhungel, MD    Family History Family History  Problem Relation Age of Onset  . Stroke Mother   . Kidney cancer Mother   . Colon cancer Maternal Uncle     Social History Social History  Substance Use Topics  . Smoking status: Current Every Day Smoker    Packs/day: 1.00    Types: Cigarettes  . Smokeless tobacco: Never Used  . Alcohol use Yes     Comment: "just socially"     Allergies   Patient has no known allergies.   Review of Systems Review of Systems  Constitutional: Negative for fever.  Respiratory: Negative for shortness of breath.   Cardiovascular: Negative for chest pain.  Gastrointestinal: Positive for nausea. Negative for  abdominal pain and vomiting.  Musculoskeletal: Negative for neck pain and neck stiffness.  Neurological: Positive for dizziness and headaches. Negative for weakness and numbness.  All other systems reviewed and are negative.    Physical Exam Updated Vital Signs BP 155/99 (BP Location: Left Arm)   Pulse 78   Temp 99.2 F (37.3 C) (Oral)   Resp 18   Ht 5' 4"  (1.626 m)   Wt 138 lb (62.6 kg)   SpO2 100%   BMI 23.69 kg/m   Physical Exam  Constitutional: She is oriented to person, place, and time. She appears well-developed and well-nourished. No distress.  HENT:  Head: Normocephalic and atraumatic.  Right Ear: Tympanic membrane, external ear and ear canal normal.  Left Ear: Tympanic membrane, external ear and ear canal normal.  Nose: Nose normal.  Eyes: EOM are normal. Pupils are equal, round, and reactive to light. Right eye exhibits no discharge. Left eye exhibits no discharge. Right eye exhibits no nystagmus. Left eye exhibits no nystagmus.  Neck: Normal range of motion. Neck supple.  Cardiovascular: Normal rate, regular rhythm and normal heart sounds.   Pulmonary/Chest: Effort normal and breath sounds normal.  Abdominal: Soft. There is no tenderness.  Neurological: She is alert and oriented to person, place, and time.  CN 3-12 grossly intact. 5/5 strength in all 4 extremities. Grossly normal sensation. Normal finger to nose.   Skin: Skin is warm and dry. She is not diaphoretic.  Nursing note and vitals reviewed.    ED Treatments / Results  Labs (all labs ordered are listed, but only abnormal results are displayed) Labs Reviewed  BASIC METABOLIC PANEL - Abnormal; Notable for the following:       Result Value   Calcium 8.8 (*)    All other components within normal limits  CBC WITH DIFFERENTIAL/PLATELET    EKG  EKG Interpretation  Date/Time:  Friday May 18 2016 10:11:31 EST Ventricular Rate:  65 PR Interval:    QRS Duration: 83 QT Interval:  374 QTC  Calculation: 389 R Axis:   53 Text Interpretation:  Normal sinus rhythm Low voltage, precordial leads tachycardia and ST changes resolved compared to Jan 2017 Confirmed by Regenia Skeeter MD, [place] [phone]) on 05/18/2016 10:19:22 AM       Radiology No results found.  Procedures Procedures (including critical care time)  Medications Ordered in ED Medications - No data to display   Initial Impression / Assessment and Plan / ED Course  I have reviewed the triage vital signs and the nursing notes.  Pertinent labs & imaging results that were available during my care of the patient were reviewed by me and considered in my medical decision making (see chart for details).  Clinical Course as of May 19 1035  Fri May 18, 2016  0944 Patient most likely has BPPV. Currently asymptomatic. No nystagmus. Neuro exam unremarkable. Will screen with ECG and basic labs and if unremarkable, plan to discharge home with Antivert and directions for the Epley maneuver. Given chronic tinnitus will need ENT follow-up  [SG]  8546 With her combination of subjective hearing loss and ear ringing for 1 month as well as now new positional vertigo, she could have Mnire disease. I discussed things to avoid such as salt, caffeine, alcohol, nicotine. She will need further workup by ENT. Currently remains asymptomatic.  [SG]    Clinical Course User Index [SG] Sherwood Gambler, MD    Given patient's symptoms are brief and episodic and currently asymptomatic my suspicion for CNS pathology such as stroke or mass is low. Follow-up with PCP as well as I stressed the importance of filling up with ENT given her one-month history of decreased hearing and tinnitus. Discussed return precautions.  Final Clinical Impressions(s) / ED Diagnoses   Final diagnoses:  Benign paroxysmal positional vertigo, unspecified laterality    New Prescriptions New Prescriptions   MECLIZINE (ANTIVERT) 25 MG TABLET    Take 1 tablet (25 mg total) by mouth  3 (three) times daily as needed for dizziness.     Sherwood Gambler, MD 05/18/16 1038

## 2016-05-18 NOTE — ED Triage Notes (Signed)
Pt sts dizziness when moving head, changing position or rolling over in bed upon waking this am

## 2016-05-18 NOTE — Discharge Planning (Signed)
Pt up for discharge. EDCM reviewed chart for possible CM needs.  No needs identified or communicated.  

## 2016-05-22 ENCOUNTER — Encounter (HOSPITAL_COMMUNITY): Payer: Self-pay

## 2016-05-22 ENCOUNTER — Emergency Department (HOSPITAL_COMMUNITY)
Admission: EM | Admit: 2016-05-22 | Discharge: 2016-05-22 | Disposition: A | Discharge status: Home / Self Care | Payer: BC Managed Care – PPO | Attending: Emergency Medicine | Admitting: Emergency Medicine

## 2016-05-22 DIAGNOSIS — R42 Dizziness and giddiness: Secondary | ICD-10-CM | POA: Insufficient documentation

## 2016-05-22 DIAGNOSIS — E119 Type 2 diabetes mellitus without complications: Secondary | ICD-10-CM | POA: Insufficient documentation

## 2016-05-22 DIAGNOSIS — I10 Essential (primary) hypertension: Secondary | ICD-10-CM | POA: Diagnosis not present

## 2016-05-22 DIAGNOSIS — F17210 Nicotine dependence, cigarettes, uncomplicated: Secondary | ICD-10-CM | POA: Insufficient documentation

## 2016-05-22 DIAGNOSIS — Z7982 Long term (current) use of aspirin: Secondary | ICD-10-CM | POA: Insufficient documentation

## 2016-05-22 MED ORDER — PROMETHAZINE HCL 25 MG PO TABS
25.0000 mg | ORAL_TABLET | Freq: Once | ORAL | Status: AC
  Administered 2016-05-22: 25 mg via ORAL
  Filled 2016-05-22: qty 1

## 2016-05-22 MED ORDER — DIAZEPAM 5 MG PO TABS: 2.5000 mg | ORAL_TABLET | Freq: Three times a day (TID) | ORAL | 0 refills | Status: DC | PRN

## 2016-05-22 MED ORDER — LORAZEPAM 1 MG PO TABS
1.0000 mg | ORAL_TABLET | Freq: Once | ORAL | Status: AC
  Administered 2016-05-22: 1 mg via ORAL
  Filled 2016-05-22: qty 1

## 2016-05-22 NOTE — ED Triage Notes (Signed)
Pt. Seen and diagnosed here on Friday with vertigo. Was told she should return if symptoms get worse before her MD appointment tomorrow. New vertigo episode started at 8AM and lasted 3 minutes with nausea, and some lightheadedness. Neg. For orthostatics with EMS and hy of hypertension

## 2016-05-22 NOTE — ED Notes (Signed)
Pt ambulated to the bathroom with RN help. Pt not steady and needed RN to help. Pt was grabbing RN as walking.

## 2016-05-22 NOTE — ED Provider Notes (Signed)
[facility] DEPT Provider Note   CSN: [id number] Arrival date & time: 05/22/16  1121  By signing my name below, I, Sonum Patel, attest that this documentation has been prepared under the direction and in the presence of Virgel Manifold, MD. Electronically Signed: Sonum Patel, Education administrator. 05/22/16. 12:37 PM.  History   Chief Complaint Chief Complaint  Patient presents with  . Dizziness    The history is provided by the patient. No language interpreter was used.    HPI Comments: Krista Carter is a 61 y.o. female who presents to the Emergency Department complaining of unchanged episodes of dizziness that has been ongoing for the last several days. She notes the most recent episode occurred this morning and had associated nausea and tinnitus. She was initially seen on 05/18/16 and was diagnosed with vertigo. She states she returned because her symptoms continue to occur and she believes her speech sounds different. She also notes left arm discomfort and left breast pain. She states she is scared of falling when she walks and has to hold on to things to support her. She denies falls, vomiting, or any other issues at this time.   Past Medical History:  Diagnosis Date  . Adrenal adenoma 02/2015   left, seen on CT.   Marland Kitchen Anemia 07/2010, 04/2015   normocytic.   Marland Kitchen Depression with anxiety 2008  . Diabetes mellitus 2011   type 2.   . Duodenitis 2005   [place] also seen on EGD.   Marland Kitchen Dyslipidemia 2011  . Expressive aphasia 03/2010   subjective word finding difficulty and slurred speach.  MRI/MRA, CT scan: mild small vessel disease, mild narrowing bilateral int carotids and RCA  . Hydronephrosis 2010   per ultrasound: mild, chronic, non-obstructing, bilateral.  . Hypertension 2011  . IBS (irritable bowel syndrome) 2008   diarrhea prone  . Mitral valve regurgitation 03/2010   Mild MVR, EF 60-65% per echo.  . Multiple gastric ulcers 09/2013   Benign, due to ASA powders.  H Pylori negative.    . Renal  insufficiency 2014   AKI 10/2012 due to dehydration, acute (probably infectious/viral) colitis, ACE use.   . Severe protein-calorie malnutrition ([place]) 10/2012  . Tibia/fibula fracture 04/2015   left.  Dital tibia, proximal fibula.     Patient Active Problem List   Diagnosis Date Noted  . Acute upper GI bleed 04/14/2015  . Gastric ulcer with hemorrhage 04/14/2015  . Closed left fibular fracture 04/14/2015  . Faintness   . Acute gastric ulcer   . History of peptic ulcer disease   . Emesis   . Hematemesis with nausea   . Syncope 04/11/2015  . Dizziness 11/27/2013  . Chest pain 11/27/2013  . AKI (acute kidney injury) ([place]) 11/27/2013  . Dehydration 11/27/2013  . Hypotension 11/27/2013  . Loss of weight 08/17/2013  . Diarrhea 08/17/2013  . Nausea alone 08/17/2013  . Generalized abdominal pain 08/17/2013  . Protein-calorie malnutrition, severe ([place]) 11/26/2012  . Nausea vomiting and diarrhea 11/25/2012  . Hypokalemia 11/25/2012  . Acute renal failure ([place]) 11/25/2012  . APNEA 02/17/2010  . SNORING 02/17/2010  . OTH SPEC PERS HX PRESENTING HAZARDS HEALTH OTH 02/17/2010  . HYPERLIPIDEMIA 06/10/2007  . SMOKER 06/10/2007  . HYPERTENSION 06/10/2007  . GERD 06/10/2007  . IBS 06/10/2007  . RENAL CALCULUS 06/10/2007  . DUODENITIS 04/06/2003  . HIATAL HERNIA 04/06/2003    Past Surgical History:  Procedure Laterality Date  . ABDOMINAL HYSTERECTOMY    . BONE MARROW BIOPSY  07/2010.    Benign fibroconnective and soft tissue with abundant acute and chronic inflammation, with foreign body multinucleated giant cell reaction. No atypia, no malignancy  . colonoscopies  2008, 09/2013   both normal.   . ESOPHAGOGASTRODUODENOSCOPY  2005, 09/2013, 10/2013  . ESOPHAGOGASTRODUODENOSCOPY (EGD) WITH PROPOFOL N/A 04/13/2015   Procedure: ESOPHAGOGASTRODUODENOSCOPY (EGD) WITH PROPOFOL;  Surgeon: Jerene Bears, MD;  Location: [facility];  Service: Endoscopy;  Laterality: N/A;  . ORIF NONUNION HUMERUS  FRACTURE  07/2010   with bone graft.     OB History    No data available       Home Medications    Prior to Admission medications   Medication Sig Start Date End Date Taking? Authorizing Provider  aspirin EC 81 MG tablet Take 81 mg by mouth daily.      Historical Provider, MD  clonazePAM (KLONOPIN) 0.5 MG tablet Take 0.5 mg by mouth 2 (two) times daily as needed. Anxiety 02/01/15   Historical Provider, MD  lisinopril (PRINIVIL,ZESTRIL) 10 MG tablet Take 10 mg by mouth daily.    Historical Provider, MD  meclizine (ANTIVERT) 25 MG tablet Take 1 tablet (25 mg total) by mouth 3 (three) times daily as needed for dizziness. 05/18/16   Sherwood Gambler, MD  metoprolol succinate (TOPROL-XL) 100 MG 24 hr tablet Take 100 mg by mouth daily. 02/02/15   Historical Provider, MD  nicotine (NICODERM CQ) 21 mg/24hr patch Place 1 patch (21 mg total) onto the skin daily. 04/14/15   [name] Dhungel, MD  oxyCODONE (OXY IR/ROXICODONE) 5 MG immediate release tablet Take 1 tablet (5 mg total) by mouth every 4 (four) hours as needed for moderate pain. 04/14/15   [name] Dhungel, MD  pantoprazole (PROTONIX) 40 MG tablet Take 1 tablet (40 mg total) by mouth 2 (two) times daily. 04/14/15   [name] Dhungel, MD  sucralfate (CARAFATE) 1 g tablet Take 1 tablet (1 g total) by mouth 4 (four) times daily -  with meals and at bedtime. 04/14/15   [name] Dhungel, MD    Family History Family History  Problem Relation Age of Onset  . Stroke Mother   . Kidney cancer Mother   . Colon cancer Maternal Uncle     Social History Social History  Substance Use Topics  . Smoking status: Current Every Day Smoker    Packs/day: 1.00    Types: Cigarettes  . Smokeless tobacco: Never Used  . Alcohol use Yes     Comment: "just socially"     Allergies   Patient has no known allergies.   Review of Systems Review of Systems  A complete 10 system review of systems was obtained and all systems are negative except as noted in the HPI  and PMH.    Physical Exam Updated Vital Signs BP 153/85 (BP Location: Left Arm) Comment: Simultaneous filing. User may not have seen previous data.  Pulse 68 Comment: Simultaneous filing. User may not have seen previous data.  Temp 98.6 F (37 C) (Oral) Comment: Simultaneous filing. User may not have seen previous data. Comment (Src): Simultaneous filing. User may not have seen previous data.  Resp 16 Comment: Simultaneous filing. User may not have seen previous data.  SpO2 100% Comment: Simultaneous filing. User may not have seen previous data.  Physical Exam  Constitutional: She is oriented to person, place, and time. She appears well-developed and well-nourished. No distress.  HENT:  Head: Normocephalic and atraumatic.  Right Ear: Tympanic membrane, external ear and ear canal normal.  Left Ear: Tympanic membrane, external ear and ear canal normal.  Eyes: EOM are normal. Pupils are equal, round, and reactive to light.  Neck: Normal range of motion.  Cardiovascular: Normal rate, regular rhythm and normal heart sounds.   Pulmonary/Chest: Effort normal and breath sounds normal.  Abdominal: Soft. She exhibits no distension. There is no tenderness.  Musculoskeletal: Normal range of motion.  Neurological: She is alert and oriented to person, place, and time. No sensory deficit. She exhibits normal muscle tone. Coordination normal.  Normal finger to nose testing  Skin: Skin is warm and dry.  Psychiatric: She has a normal mood and affect. Her behavior is normal. Judgment and thought content normal.  Nursing note and vitals reviewed.    ED Treatments / Results  DIAGNOSTIC STUDIES: Oxygen Saturation is 100% on RA, normal by my interpretation.    COORDINATION OF CARE: 12:34 PM Discussed treatment plan with pt at bedside and pt agreed to plan.   Labs (all labs ordered are listed, but only abnormal results are displayed) Labs Reviewed - No data to display  EKG  EKG  Interpretation None       Radiology No results found.  Procedures Procedures (including critical care time)  Medications Ordered in ED Medications - No data to display   Initial Impression / Assessment and Plan / ED Course  I have reviewed the triage vital signs and the nursing notes.  Pertinent labs & imaging results that were available during my care of the patient were reviewed by me and considered in my medical decision making (see chart for details).    60yF with symptoms consistent with vertigo. Likely peripheral. Positional. Reproducible. Associated tinnitus. Doubt central cause. Plan sypmtomatic tx.   Final Clinical Impressions(s) / ED Diagnoses   Final diagnoses:  Vertigo    New Prescriptions New Prescriptions   No medications on file    I personally preformed the services scribed in my presence. The recorded information has been reviewed is accurate. Virgel Manifold, MD.    Virgel Manifold, MD 06/07/16 [phone]

## 2016-06-06 ENCOUNTER — Other Ambulatory Visit: Payer: Self-pay | Admitting: Family Medicine

## 2016-06-06 DIAGNOSIS — R4781 Slurred speech: Secondary | ICD-10-CM

## 2016-06-06 DIAGNOSIS — R410 Disorientation, unspecified: Secondary | ICD-10-CM

## 2016-06-06 DIAGNOSIS — R42 Dizziness and giddiness: Secondary | ICD-10-CM

## 2016-06-08 ENCOUNTER — Emergency Department (HOSPITAL_COMMUNITY)
Admission: EM | Admit: 2016-06-08 | Discharge: 2016-06-09 | Disposition: A | Discharge status: Home / Self Care | Payer: BC Managed Care – PPO | Attending: Emergency Medicine | Admitting: Emergency Medicine

## 2016-06-08 ENCOUNTER — Emergency Department (HOSPITAL_COMMUNITY): Payer: BC Managed Care – PPO

## 2016-06-08 ENCOUNTER — Encounter (HOSPITAL_COMMUNITY): Payer: Self-pay | Admitting: *Deleted

## 2016-06-08 DIAGNOSIS — Z7982 Long term (current) use of aspirin: Secondary | ICD-10-CM | POA: Diagnosis not present

## 2016-06-08 DIAGNOSIS — Z79899 Other long term (current) drug therapy: Secondary | ICD-10-CM | POA: Insufficient documentation

## 2016-06-08 DIAGNOSIS — F17210 Nicotine dependence, cigarettes, uncomplicated: Secondary | ICD-10-CM | POA: Insufficient documentation

## 2016-06-08 DIAGNOSIS — R42 Dizziness and giddiness: Secondary | ICD-10-CM | POA: Diagnosis present

## 2016-06-08 DIAGNOSIS — E119 Type 2 diabetes mellitus without complications: Secondary | ICD-10-CM | POA: Insufficient documentation

## 2016-06-08 DIAGNOSIS — R791 Abnormal coagulation profile: Secondary | ICD-10-CM | POA: Insufficient documentation

## 2016-06-08 DIAGNOSIS — I10 Essential (primary) hypertension: Secondary | ICD-10-CM | POA: Diagnosis not present

## 2016-06-08 LAB — URINALYSIS, COMPLETE (UACMP) WITH MICROSCOPIC
BILIRUBIN URINE: NEGATIVE
GLUCOSE, UA: NEGATIVE mg/dL
Ketones, ur: NEGATIVE mg/dL
LEUKOCYTES UA: NEGATIVE
Nitrite: NEGATIVE
PH: 5 (ref 5.0–8.0)
Protein, ur: NEGATIVE mg/dL
SPECIFIC GRAVITY, URINE: 1.023 (ref 1.005–1.030)

## 2016-06-08 LAB — COMPREHENSIVE METABOLIC PANEL
ALT: 8 U/L — ABNORMAL LOW (ref 14–54)
AST: 17 U/L (ref 15–41)
Albumin: 3.3 g/dL — ABNORMAL LOW (ref 3.5–5.0)
Alkaline Phosphatase: 62 U/L (ref 38–126)
Anion gap: 10 (ref 5–15)
BILIRUBIN TOTAL: 0.4 mg/dL (ref 0.3–1.2)
BUN: 11 mg/dL (ref 6–20)
CHLORIDE: 106 mmol/L (ref 101–111)
CO2: 24 mmol/L (ref 22–32)
Calcium: 8.7 mg/dL — ABNORMAL LOW (ref 8.9–10.3)
Creatinine, Ser: 0.73 mg/dL (ref 0.44–1.00)
Glucose, Bld: 106 mg/dL — ABNORMAL HIGH (ref 65–99)
POTASSIUM: 3.4 mmol/L — AB (ref 3.5–5.1)
Sodium: 140 mmol/L (ref 135–145)
TOTAL PROTEIN: 6.8 g/dL (ref 6.5–8.1)

## 2016-06-08 LAB — PROTIME-INR
INR: 1
Prothrombin Time: 13.2 seconds (ref 11.4–15.2)

## 2016-06-08 LAB — DIFFERENTIAL
BASOS PCT: 1 %
Basophils Absolute: 0.1 10*3/uL (ref 0.0–0.1)
EOS ABS: 0.2 10*3/uL (ref 0.0–0.7)
EOS PCT: 3 %
LYMPHS ABS: 2.4 10*3/uL (ref 0.7–4.0)
Lymphocytes Relative: 41 %
MONO ABS: 0.4 10*3/uL (ref 0.1–1.0)
MONOS PCT: 7 %
Neutro Abs: 2.8 10*3/uL (ref 1.7–7.7)
Neutrophils Relative %: 48 %

## 2016-06-08 LAB — CBC
HEMATOCRIT: 37 % (ref 36.0–46.0)
HEMOGLOBIN: 12 g/dL (ref 12.0–15.0)
MCH: 31 pg (ref 26.0–34.0)
MCHC: 32.4 g/dL (ref 30.0–36.0)
MCV: 95.6 fL (ref 78.0–100.0)
Platelets: 218 10*3/uL (ref 150–400)
RBC: 3.87 MIL/uL (ref 3.87–5.11)
RDW: 15.8 % — AB (ref 11.5–15.5)
WBC: 5.9 10*3/uL (ref 4.0–10.5)

## 2016-06-08 LAB — APTT: aPTT: 30 seconds (ref 24–36)

## 2016-06-08 LAB — TROPONIN I

## 2016-06-08 MED ORDER — MECLIZINE HCL 25 MG PO TABS
25.0000 mg | ORAL_TABLET | Freq: Once | ORAL | Status: AC
  Administered 2016-06-08: 25 mg via ORAL
  Filled 2016-06-08: qty 1

## 2016-06-08 MED ORDER — LORAZEPAM 2 MG/ML IJ SOLN
1.0000 mg | Freq: Once | INTRAMUSCULAR | Status: AC
  Administered 2016-06-08: 1 mg via INTRAVENOUS
  Filled 2016-06-08: qty 1

## 2016-06-08 NOTE — ED Notes (Signed)
Pt to MRI

## 2016-06-08 NOTE — ED Triage Notes (Signed)
The pt  Has had dizziness slurred speech and memory problems for 4 weeks  She reports that she has been seen here  2 or 3 time for the same and has been seen by her regular doctor

## 2016-06-08 NOTE — ED Provider Notes (Signed)
[facility] DEPT Provider Note   CSN: [id number] Arrival date & time: 06/08/16  1929     History   Chief Complaint Chief Complaint  Patient presents with  . Dizziness    HPI Krista Carter is a 61 y.o. female.  Patient's a 61 year old female with a history of diabetes, hypertension and depression who presents with dizziness. She reports a 3-4 week history of dizziness which she describes as a spinning sensation. It's worse when she is ambulating. She feels off balance on ambulation. She's had intermittent headaches. She feels like her speech is slurred. She denies any numbness or weakness to her extremities. No fevers. No coughing or cold symptoms. She's been seen twice for similar symptoms over the last couple of weeks. She was initially started on meclizine and on her most recent visit was started on Valium. She does not feel that these medications have helped her symptoms. She still feels off balance on ambulation.      Past Medical History:  Diagnosis Date  . Adrenal adenoma 02/2015   left, seen on CT.   Marland Kitchen Anemia 07/2010, 04/2015   normocytic.   Marland Kitchen Depression with anxiety 2008  . Diabetes mellitus 2011   type 2.   . Duodenitis 2005   [place] also seen on EGD.   Marland Kitchen Dyslipidemia 2011  . Expressive aphasia 03/2010   subjective word finding difficulty and slurred speach.  MRI/MRA, CT scan: mild small vessel disease, mild narrowing bilateral int carotids and RCA  . Hydronephrosis 2010   per ultrasound: mild, chronic, non-obstructing, bilateral.  . Hypertension 2011  . IBS (irritable bowel syndrome) 2008   diarrhea prone  . Mitral valve regurgitation 03/2010   Mild MVR, EF 60-65% per echo.  . Multiple gastric ulcers 09/2013   Benign, due to ASA powders.  H Pylori negative.    . Renal insufficiency 2014   AKI 10/2012 due to dehydration, acute (probably infectious/viral) colitis, ACE use.   . Severe protein-calorie malnutrition ([place]) 10/2012  . Tibia/fibula fracture 04/2015   left.   Dital tibia, proximal fibula.     Patient Active Problem List   Diagnosis Date Noted  . Acute upper GI bleed 04/14/2015  . Gastric ulcer with hemorrhage 04/14/2015  . Closed left fibular fracture 04/14/2015  . Faintness   . Acute gastric ulcer   . History of peptic ulcer disease   . Emesis   . Hematemesis with nausea   . Syncope 04/11/2015  . Dizziness 11/27/2013  . Chest pain 11/27/2013  . AKI (acute kidney injury) ([place]) 11/27/2013  . Dehydration 11/27/2013  . Hypotension 11/27/2013  . Loss of weight 08/17/2013  . Diarrhea 08/17/2013  . Nausea alone 08/17/2013  . Generalized abdominal pain 08/17/2013  . Protein-calorie malnutrition, severe ([place]) 11/26/2012  . Nausea vomiting and diarrhea 11/25/2012  . Hypokalemia 11/25/2012  . Acute renal failure ([place]) 11/25/2012  . APNEA 02/17/2010  . SNORING 02/17/2010  . OTH SPEC PERS HX PRESENTING HAZARDS HEALTH OTH 02/17/2010  . HYPERLIPIDEMIA 06/10/2007  . SMOKER 06/10/2007  . HYPERTENSION 06/10/2007  . GERD 06/10/2007  . IBS 06/10/2007  . RENAL CALCULUS 06/10/2007  . DUODENITIS 04/06/2003  . HIATAL HERNIA 04/06/2003    Past Surgical History:  Procedure Laterality Date  . ABDOMINAL HYSTERECTOMY    . BONE MARROW BIOPSY  07/2010.    Benign fibroconnective and soft tissue with abundant acute and chronic inflammation, with foreign body multinucleated giant cell reaction. No atypia, no malignancy  . colonoscopies  2008, 09/2013  both normal.   . ESOPHAGOGASTRODUODENOSCOPY  2005, 09/2013, 10/2013  . ESOPHAGOGASTRODUODENOSCOPY (EGD) WITH PROPOFOL N/A 04/13/2015   Procedure: ESOPHAGOGASTRODUODENOSCOPY (EGD) WITH PROPOFOL;  Surgeon: Jerene Bears, MD;  Location: [facility];  Service: Endoscopy;  Laterality: N/A;  . ORIF NONUNION HUMERUS FRACTURE  07/2010   with bone graft.     OB History    No data available       Home Medications    Prior to Admission medications   Medication Sig Start Date End Date Taking? Authorizing  Provider  aspirin EC 81 MG tablet Take 81 mg by mouth daily.      Historical Provider, MD  clonazePAM (KLONOPIN) 0.5 MG tablet Take 0.5 mg by mouth 2 (two) times daily as needed. Anxiety 02/01/15   Historical Provider, MD  diazepam (VALIUM) 5 MG tablet Take 0.5-1 tablets (2.5-5 mg total) by mouth every 8 (eight) hours as needed for anxiety. 05/22/16   Virgel Manifold, MD  lisinopril (PRINIVIL,ZESTRIL) 10 MG tablet Take 10 mg by mouth daily.    Historical Provider, MD  meclizine (ANTIVERT) 25 MG tablet Take 1 tablet (25 mg total) by mouth 3 (three) times daily as needed for dizziness. 05/18/16   Sherwood Gambler, MD  metoprolol succinate (TOPROL-XL) 100 MG 24 hr tablet Take 100 mg by mouth daily. 02/02/15   Historical Provider, MD  nicotine (NICODERM CQ) 21 mg/24hr patch Place 1 patch (21 mg total) onto the skin daily. 04/14/15   [name] Dhungel, MD  oxyCODONE (OXY IR/ROXICODONE) 5 MG immediate release tablet Take 1 tablet (5 mg total) by mouth every 4 (four) hours as needed for moderate pain. 04/14/15   [name] Dhungel, MD  pantoprazole (PROTONIX) 40 MG tablet Take 1 tablet (40 mg total) by mouth 2 (two) times daily. 04/14/15   [name] Dhungel, MD  sucralfate (CARAFATE) 1 g tablet Take 1 tablet (1 g total) by mouth 4 (four) times daily -  with meals and at bedtime. 04/14/15   [name] Dhungel, MD    Family History Family History  Problem Relation Age of Onset  . Stroke Mother   . Kidney cancer Mother   . Colon cancer Maternal Uncle     Social History Social History  Substance Use Topics  . Smoking status: Current Every Day Smoker    Packs/day: 1.00    Types: Cigarettes  . Smokeless tobacco: Never Used  . Alcohol use Yes     Comment: "just socially"     Allergies   Patient has no known allergies.   Review of Systems Review of Systems  Constitutional: Negative for chills, diaphoresis, fatigue and fever.  HENT: Negative for congestion, rhinorrhea and sneezing.   Eyes: Negative.     Respiratory: Negative for cough, chest tightness and shortness of breath.   Cardiovascular: Negative for chest pain and leg swelling.  Gastrointestinal: Negative for abdominal pain, blood in stool, diarrhea, nausea and vomiting.  Genitourinary: Negative for difficulty urinating, flank pain, frequency and hematuria.  Musculoskeletal: Negative for arthralgias and back pain.  Skin: Negative for rash.  Neurological: Positive for dizziness, speech difficulty and headaches. Negative for weakness and numbness.     Physical Exam Updated Vital Signs BP 137/91 (BP Location: Right Arm)   Pulse 84   Temp 98.3 F (36.8 C) (Oral)   Resp 22   SpO2 99%   Physical Exam  Constitutional: She is oriented to person, place, and time. She appears well-developed and well-nourished.  HENT:  Head: Normocephalic and atraumatic.  Eyes: Pupils  are equal, round, and reactive to light.  Positive horizontal nystagmus with fast component to the right. There is also some subtle rotational nystagmus  Neck: Normal range of motion. Neck supple.  Cardiovascular: Normal rate, regular rhythm and normal heart sounds.   Pulmonary/Chest: Effort normal and breath sounds normal. No respiratory distress. She has no wheezes. She has no rales. She exhibits no tenderness.  Abdominal: Soft. Bowel sounds are normal. There is no tenderness. There is no rebound and no guarding.  Musculoskeletal: Normal range of motion. She exhibits no edema.  Lymphadenopathy:    She has no cervical adenopathy.  Neurological: She is alert and oriented to person, place, and time.  Motor 5/5 all extremities Sensation grossly intact to LT all extremities Finger to Nose intact, no pronator drift CN II-XII grossly intact    Skin: Skin is warm and dry. No rash noted.  Psychiatric: She has a normal mood and affect.     ED Treatments / Results  Labs (all labs ordered are listed, but only abnormal results are displayed) Labs Reviewed  CBC -  Abnormal; Notable for the following:       Result Value   RDW 15.8 (*)    All other components within normal limits  COMPREHENSIVE METABOLIC PANEL - Abnormal; Notable for the following:    Potassium 3.4 (*)    Glucose, Bld 106 (*)    Calcium 8.7 (*)    Albumin 3.3 (*)    ALT 8 (*)    All other components within normal limits  URINALYSIS, COMPLETE (UACMP) WITH MICROSCOPIC - Abnormal; Notable for the following:    APPearance HAZY (*)    Hgb urine dipstick SMALL (*)    Bacteria, UA RARE (*)    Squamous Epithelial / LPF 0-5 (*)    All other components within normal limits  PROTIME-INR  APTT  DIFFERENTIAL  TROPONIN I    EKG  EKG Interpretation  Date/Time:  Friday June 08 2016 19:52:53 EST Ventricular Rate:  81 PR Interval:  138 QRS Duration: 68 QT Interval:  356 QTC Calculation: 413 R Axis:   54 Text Interpretation:  Normal sinus rhythm Low voltage QRS Borderline ECG since last tracing no significant change Confirmed by BELFI  MD, MELANIE ([id number]) on 06/08/2016 9:29:09 PM       Radiology Mr Brain Wo Contrast  Result Date: 06/08/2016 CLINICAL DATA:  Initial evaluation for worsening dizziness for 4 weeks. EXAM: MRI HEAD WITHOUT CONTRAST TECHNIQUE: Multiplanar, multiecho pulse sequences of the brain and surrounding structures were obtained without intravenous contrast. COMPARISON:  Prior MRI from 03/05/2010. FINDINGS: Brain: Cerebral volume within normal limits for age. Minimal patchy T2/FLAIR hyperintensity within the periventricular and deep white matter both cerebral hemispheres, nonspecific, but most likely related to chronic microvascular ischemic disease. No abnormal foci of restricted diffusion to suggest acute intracranial infarct. Gray-white matter differentiation maintained. No evidence for acute or chronic intracranial hemorrhage. No areas of chronic infarction. No mass lesion, midline shift, or mass effect. No hydrocephalus. No extra-axial fluid collection. Pituitary gland  suprasellar region within normal limits. Vascular: Major intracranial vascular flow voids well maintained. Skull and upper cervical spine: Craniocervical junction normal. Visualized upper cervical spine within normal limits. Bone marrow signal intensity normal. No scalp soft tissue abnormality. Sinuses/Orbits: Globes and orbital soft tissues within normal limits. Mild scattered mucosal thickening within the ethmoid air cells. Paranasal sinuses are otherwise clear. Trace opacity bilateral mastoid air cells. Inner ear structures normal. Other: No other significant finding. IMPRESSION: Normal  brain MRI for patient age. No acute intracranial process identified. Electronically Signed   By: Jeannine Boga M.D.   On: 06/08/2016 23:38    Procedures Procedures (including critical care time)  Medications Ordered in ED Medications  meclizine (ANTIVERT) tablet 25 mg (25 mg Oral Given 06/08/16 2345)  LORazepam (ATIVAN) injection 1 mg (1 mg Intravenous Given 06/08/16 2229)     Initial Impression / Assessment and Plan / ED Course  I have reviewed the triage vital signs and the nursing notes.  Pertinent labs & imaging results that were available during my care of the patient were reviewed by me and considered in my medical decision making (see chart for details).     Patient symptoms are consistent with vertigo. Her MRI was negative. She is able to ambulate without ataxia. She was discharged home in good condition. She will follow-up with her PCP. Return precautions were given. She has meclizine and Valium to use at home for symptomatic relief.  Final Clinical Impressions(s) / ED Diagnoses   Final diagnoses:  Vertigo    New Prescriptions New Prescriptions   No medications on file     Malvin Johns, MD 06/08/16 2358

## 2016-06-09 NOTE — ED Notes (Signed)
Pt verbalized understanding of d/c instructions and has no further questions. Pt is stable, A&Ox4, VSS.  

## 2016-06-09 NOTE — ED Notes (Signed)
MD ambulated pt and stated she did well

## 2016-06-11 ENCOUNTER — Inpatient Hospital Stay
Admission: RE | Admit: 2016-06-11 | Discharge: 2016-06-11 | Disposition: A | Discharge status: Home / Self Care | Payer: BC Managed Care – PPO | Source: Ambulatory Visit | Attending: Family Medicine | Admitting: Family Medicine

## 2016-06-12 ENCOUNTER — Ambulatory Visit: Payer: BC Managed Care – PPO | Admitting: Neurology

## 2016-06-18 ENCOUNTER — Encounter: Payer: Self-pay | Admitting: Neurology

## 2016-07-05 ENCOUNTER — Ambulatory Visit (INDEPENDENT_AMBULATORY_CARE_PROVIDER_SITE_OTHER): Payer: BC Managed Care – PPO | Admitting: Neurology

## 2016-07-05 ENCOUNTER — Encounter: Payer: Self-pay | Admitting: Neurology

## 2016-07-05 ENCOUNTER — Encounter (INDEPENDENT_AMBULATORY_CARE_PROVIDER_SITE_OTHER): Payer: Self-pay

## 2016-07-05 DIAGNOSIS — R4781 Slurred speech: Secondary | ICD-10-CM

## 2016-07-05 DIAGNOSIS — H8111 Benign paroxysmal vertigo, right ear: Secondary | ICD-10-CM

## 2016-07-05 DIAGNOSIS — H8110 Benign paroxysmal vertigo, unspecified ear: Secondary | ICD-10-CM | POA: Insufficient documentation

## 2016-07-05 DIAGNOSIS — R269 Unspecified abnormalities of gait and mobility: Secondary | ICD-10-CM | POA: Insufficient documentation

## 2016-07-05 MED ORDER — PREDNISONE 10 MG PO TABS: ORAL_TABLET | ORAL | 0 refills | Status: DC

## 2016-07-05 MED ORDER — DIAZEPAM 5 MG PO TABS: 2.5000 mg | ORAL_TABLET | Freq: Three times a day (TID) | ORAL | 0 refills | Status: DC | PRN

## 2016-07-05 NOTE — Progress Notes (Signed)
[facility]  PATIENT: Krista Carter DOB: 08-31-1955  REFERRING DOCTOR OR PCP:  Donavan Burnet SOURCE: patient, notes from Dr. Alroy Dust, labs and imaging reports, MRI images on PACS.  _________________________________   HISTORICAL  CHIEF COMPLAINT:  Chief Complaint  Patient presents with  . New Patient (Initial Visit)    Dr. Alroy Dust  . Dizziness    happening for one month, falls, speech is slurred, dry mouth, orthos: sitting- 118/80, 67 standing-116/78, 64, lying- 135/88, 65    HISTORY OF PRESENT ILLNESS:  I had the pleasure seeing you patient, Krista Carter, at [facility] for neurologic consultation regarding her dizziness and poor balance.  For the past 6 weeks, she has had vertigo.  Gait has been unsteady and she has fallen.   She has vertigo that is worse with movements.   She gets individual spells lasting 1-2 minutes superimposed on a milder chronic vertigo.    Movements, such as bending down or rolling over and standing up will trigger one of the more intense spells.   When she walks she veers towards the right.    Ears are ok but she has noted a mild roaring that seems to be bilateral.      She also has had slurred speech x 6 weeks.   She noted a dry mouth around that time, as well.   She also notes vision is mildly blurred.    She denies any numbness or bladder changes.   She denies any chemical or heavy metal exposure.      She thinks she is doing about the same.this week as a few weeks ago.  She also feels more tired and more anxious since the symptoms started.    She is more apathetic and is less active the past month.  She feels depressed since symptoms started.    She has had more stres the past year with her husband's death and nephew's death.    She is afraid to get out on the road with driving because she is afraid she will have a vertigo episode.    I personally reviewed the MRI of the brain dated 06/08/2016. It is  borderline normal for age showing some small white matter foci consistent with slightly age-advanced chronic microvascular ischemic change (besides age, also smokes). Some of the ethmoid air cells have mild mucoperiosteal thickening, unlikely to be symptomatic.    CBC, CMP was non-contributary.     REVIEW OF SYSTEMS: Constitutional: No fevers, chills, sweats, or change in appetite Eyes: No visual changes, double vision, eye pain Ear, nose and throat: No hearing loss, ear pain, nasal congestion, sore throat Cardiovascular: No chest pain, palpitations Respiratory: No shortness of breath at rest or with exertion.   No wheezes.  She smokes GastrointestinaI:  She has a history of ulcer. She has GERD. Genitourinary: No dysuria, urinary retention or frequency.  No nocturia. Musculoskeletal: No neck pain, back pain Integumentary: No rash, pruritus, skin lesions Neurological: as above Psychiatric: No depression at this time.  No anxiety Endocrine: No palpitations, diaphoresis, change in appetite, change in weigh or increased thirst Hematologic/Lymphatic: No anemia, purpura, petechiae. Allergic/Immunologic: No itchy/runny eyes, nasal congestion, recent allergic reactions, rashes  ALLERGIES: No Known Allergies  HOME MEDICATIONS:  Current Outpatient Prescriptions:  .  aspirin EC 81 MG tablet, Take 81 mg by mouth daily.  , Disp: , Rfl:  .  clonazePAM (KLONOPIN) 0.5 MG tablet, Take 0.5 mg by mouth 2 (two) times daily as needed.  Anxiety, Disp: , Rfl:  .  diazepam (VALIUM) 5 MG tablet, Take 0.5-1 tablets (2.5-5 mg total) by mouth every 8 (eight) hours as needed (vertigo)., Disp: 60 tablet, Rfl: 0 .  lisinopril (PRINIVIL,ZESTRIL) 10 MG tablet, Take 10 mg by mouth daily., Disp: , Rfl:  .  meclizine (ANTIVERT) 25 MG tablet, Take 1 tablet (25 mg total) by mouth 3 (three) times daily as needed for dizziness., Disp: 10 tablet, Rfl: 0 .  metoprolol succinate (TOPROL-XL) 100 MG 24 hr tablet, Take 100 mg by  mouth daily., Disp: , Rfl:  .  nicotine (NICODERM CQ) 21 mg/24hr patch, Place 1 patch (21 mg total) onto the skin daily., Disp: 28 patch, Rfl: 0 .  oxyCODONE (OXY IR/ROXICODONE) 5 MG immediate release tablet, Take 1 tablet (5 mg total) by mouth every 4 (four) hours as needed for moderate pain., Disp: 30 tablet, Rfl: 0 .  pantoprazole (PROTONIX) 40 MG tablet, Take 1 tablet (40 mg total) by mouth 2 (two) times daily., Disp: 60 tablet, Rfl: 0 .  sucralfate (CARAFATE) 1 g tablet, Take 1 tablet (1 g total) by mouth 4 (four) times daily -  with meals and at bedtime., Disp: 120 tablet, Rfl: 0 .  predniSONE (DELTASONE) 10 MG tablet, Take 6 po x 1d, 5 po x 1d, 4 po x 1d, 3 po x 1d, 2 po x 1d, 1 po x1d, Disp: 21 tablet, Rfl: 0  PAST MEDICAL HISTORY: Past Medical History:  Diagnosis Date  . Adrenal adenoma 02/2015   left, seen on CT.   Marland Kitchen Anemia 07/2010, 04/2015   normocytic.   Marland Kitchen Depression with anxiety 2008  . Diabetes mellitus 2011   type 2.   . Duodenitis 2005   [place] also seen on EGD.   Marland Kitchen Dyslipidemia 2011  . Expressive aphasia 03/2010   subjective word finding difficulty and slurred speach.  MRI/MRA, CT scan: mild small vessel disease, mild narrowing bilateral int carotids and RCA  . Hydronephrosis 2010   per ultrasound: mild, chronic, non-obstructing, bilateral.  . Hypertension 2011  . IBS (irritable bowel syndrome) 2008   diarrhea prone  . Mitral valve regurgitation 03/2010   Mild MVR, EF 60-65% per echo.  . Multiple gastric ulcers 09/2013   Benign, due to ASA powders.  H Pylori negative.    . Renal insufficiency 2014   AKI 10/2012 due to dehydration, acute (probably infectious/viral) colitis, ACE use.   . Severe protein-calorie malnutrition ([place]) 10/2012  . Tibia/fibula fracture 04/2015   left.  Dital tibia, proximal fibula.     PAST SURGICAL HISTORY: Past Surgical History:  Procedure Laterality Date  . ABDOMINAL HYSTERECTOMY    . BONE MARROW BIOPSY  07/2010.    Benign fibroconnective  and soft tissue with abundant acute and chronic inflammation, with foreign body multinucleated giant cell reaction. No atypia, no malignancy  . colonoscopies  2008, 09/2013   both normal.   . ESOPHAGOGASTRODUODENOSCOPY  2005, 09/2013, 10/2013  . ESOPHAGOGASTRODUODENOSCOPY (EGD) WITH PROPOFOL N/A 04/13/2015   Procedure: ESOPHAGOGASTRODUODENOSCOPY (EGD) WITH PROPOFOL;  Surgeon: Jerene Bears, MD;  Location: [facility];  Service: Endoscopy;  Laterality: N/A;  . ORIF NONUNION HUMERUS FRACTURE  07/2010   with bone graft.     FAMILY HISTORY: Family History  Problem Relation Age of Onset  . Stroke Mother   . Kidney cancer Mother   . Colon cancer Maternal Uncle     SOCIAL HISTORY:  Social History   Social History  . Marital status: Married  Spouse name: N/A  . Number of children: 2  . Years of education: N/A   Occupational History  . Retired    Social History Main Topics  . Smoking status: Current Every Day Smoker    Packs/day: 1.00    Types: Cigarettes  . Smokeless tobacco: Never Used  . Alcohol use Yes     Comment: "just socially"  . Drug use: No  . Sexual activity: Yes    Birth control/ protection: None   Other Topics Concern  . Not on file   Social History Narrative  . No narrative on file     PHYSICAL EXAM  Vitals:   07/05/16 1458  Resp: 20  Weight: 143 lb (64.9 kg)  Height: _0  (1.626 m)    Body mass index is 24.55 kg/m.   General: The patient is well-developed and well-nourished and in no acute distress  HEENT:  Funduscopic exam shows normal optic discs and retinal vessels.    She has wax in both ear canals but the tympanic membranes are visible and did not appear to be inflamed.  Neck: The neck is supple, no carotid bruits are noted.  The neck is nontender.  Cardiovascular: The heart has a regular rate and rhythm with a normal S1 and S2. There were no murmurs, gallops or rubs. Lungs are clear to auscultation.  Skin: Extremities are without  significant edema.  Musculoskeletal:  Back is nontender  Neurologic Exam  Mental status: The patient is alert and oriented x 3 at the time of the examination. The patient has apparent normal recent and remote memory, with an apparently normal attention span and concentration ability.   Speech is normal.  Cranial nerves: Extraocular movements are full. Pupils are equal, round, and reactive to light and accomodation.  There is good facial sensation to soft touch bilaterally.Facial strength is normal.  Speech is slightly slurred. Trapezius and sternocleidomastoid strength is normal. No dysarthria is noted.  The tongue is midline, and the patient has symmetric elevation of the soft palate. No obvious hearing deficits are noted.  Motor:  Muscle bulk is normal.   Tone is normal. Strength is  5 / 5 in all 4 extremities.   Sensory: Sensory testing is intact to soft touch and vibration sensation in all 4 extremities.  Coordination: Cerebellar testing reveals good finger-nose-finger and heel-to-shin bilaterally.  Gait and station: Station is normal.   Gait veers to the right and she cannot do a tandem walk. Romberg is negative.   Reflexes: Deep tendon reflexes are symmetric and normal bilaterally.   Plantar responses are flexor.  Diix-Hallpike Maneuver (to the right) evoked vertigo and rotatory nystagmus     DIAGNOSTIC DATA (LABS, IMAGING, TESTING) - I reviewed patient records, labs, notes, testing and imaging myself where available.  Lab Results  Component Value Date   WBC 5.9 06/08/2016   HGB 12.0 06/08/2016   HCT 37.0 06/08/2016   MCV 95.6 06/08/2016   PLT 218 06/08/2016      Component Value Date/Time   NA 140 06/08/2016 2000   K 3.4 (L) 06/08/2016 2000   CL 106 06/08/2016 2000   CO2 24 06/08/2016 2000   GLUCOSE 106 (H) 06/08/2016 2000   BUN 11 06/08/2016 2000   CREATININE 0.73 06/08/2016 2000   CALCIUM 8.7 (L) 06/08/2016 2000   PROT 6.8 06/08/2016 2000   ALBUMIN 3.3 (L)  06/08/2016 2000   AST 17 06/08/2016 2000   ALT 8 (L) 06/08/2016 2000   ALKPHOS 62  06/08/2016 2000   BILITOT 0.4 06/08/2016 2000   GFRNONAA >60 06/08/2016 2000   GFRAA >60 06/08/2016 2000   Lab Results  Component Value Date   CHOL 226 (H) 11/27/2013   HDL 48 11/27/2013   LDLCALC 139 (H) 11/27/2013   TRIG 195 (H) 11/27/2013   CHOLHDL 4.7 11/27/2013   Lab Results  Component Value Date   HGBA1C 5.7 (H) 11/27/2013   No results found for: VITAMINB12 Lab Results  Component Value Date   TSH 0.846 11/27/2013       ASSESSMENT AND PLAN  Benign paroxysmal positional vertigo of right ear  Slurred speech  Gait disturbance  1.   On examination, she had rotatory nystagmus and vertigo that was evoked by a Dix-Hallpike maneuver to the right. This is consistent with benign positional vertigo. I proceeded to do an Epley maneuver starting on the right. During the maneuver she had vertigo several times but seemed to be walking  better afterwards. I showed her how to do the [employer] exercises and gave her a handout that she should start if the vertigo is not better tomorrow. For a steroid Dosepak and  Valium to help with the symptoms. 2.    If she is not better in a couple weeks,  consider vestibular therapy. 3.   She has a little more chronic microvascular ischemic change than expected for age. This could be due to her smoking.  We will return to see me in 6 or 7 weeks or sooner if there are new or worsening neurologic symptoms.  Onya Eutsler A. Felecia Shelling, MD, PhD 11/07/1101, 1:59 PM Certified in Neurology, Clinical Neurophysiology, Sleep Medicine, Pain Medicine and Neuroimaging  [facility] [address] [place], [place] [address] [phone]

## 2016-07-16 ENCOUNTER — Ambulatory Visit: Payer: BC Managed Care – PPO | Admitting: Neurology

## 2016-07-30 ENCOUNTER — Telehealth: Payer: Self-pay | Admitting: Neurology

## 2016-07-30 DIAGNOSIS — R42 Dizziness and giddiness: Secondary | ICD-10-CM

## 2016-07-30 NOTE — Telephone Encounter (Signed)
I have spoken with pt. this am. She sts. some initial relief of dizziness with oral steroids, Epley maneuver, but dizziness persists.  No new sx. reported. Per RAS ov note, I have offered PT for vestibular rehab. She sts. will think about this and call back if she wants PT referral sent in/fim

## 2016-07-30 NOTE — Telephone Encounter (Signed)
Pt called is having problems with balance, arms and hands shaking, slurring words. She is wanting to know if there a medication she can take

## 2016-07-31 ENCOUNTER — Emergency Department (HOSPITAL_COMMUNITY)
Admission: EM | Admit: 2016-07-31 | Discharge: 2016-07-31 | Disposition: A | Discharge status: Home / Self Care | Payer: BC Managed Care – PPO | Attending: Emergency Medicine | Admitting: Emergency Medicine

## 2016-07-31 ENCOUNTER — Encounter (HOSPITAL_COMMUNITY): Payer: Self-pay | Admitting: *Deleted

## 2016-07-31 DIAGNOSIS — Z79899 Other long term (current) drug therapy: Secondary | ICD-10-CM | POA: Insufficient documentation

## 2016-07-31 DIAGNOSIS — E119 Type 2 diabetes mellitus without complications: Secondary | ICD-10-CM | POA: Diagnosis not present

## 2016-07-31 DIAGNOSIS — F17210 Nicotine dependence, cigarettes, uncomplicated: Secondary | ICD-10-CM | POA: Diagnosis not present

## 2016-07-31 DIAGNOSIS — R42 Dizziness and giddiness: Secondary | ICD-10-CM

## 2016-07-31 DIAGNOSIS — I10 Essential (primary) hypertension: Secondary | ICD-10-CM | POA: Insufficient documentation

## 2016-07-31 DIAGNOSIS — Z7982 Long term (current) use of aspirin: Secondary | ICD-10-CM | POA: Diagnosis not present

## 2016-07-31 LAB — URINALYSIS, ROUTINE W REFLEX MICROSCOPIC
Glucose, UA: NEGATIVE mg/dL
Hgb urine dipstick: NEGATIVE
Ketones, ur: 5 mg/dL — AB
Leukocytes, UA: NEGATIVE
NITRITE: NEGATIVE
PH: 5 (ref 5.0–8.0)
Protein, ur: 30 mg/dL — AB
SPECIFIC GRAVITY, URINE: 1.038 — AB (ref 1.005–1.030)

## 2016-07-31 LAB — CBC
HCT: 38.5 % (ref 36.0–46.0)
HEMOGLOBIN: 12.6 g/dL (ref 12.0–15.0)
MCH: 31.1 pg (ref 26.0–34.0)
MCHC: 32.7 g/dL (ref 30.0–36.0)
MCV: 95.1 fL (ref 78.0–100.0)
PLATELETS: 245 10*3/uL (ref 150–400)
RBC: 4.05 MIL/uL (ref 3.87–5.11)
RDW: 16.6 % — ABNORMAL HIGH (ref 11.5–15.5)
WBC: 6.5 10*3/uL (ref 4.0–10.5)

## 2016-07-31 LAB — BASIC METABOLIC PANEL
ANION GAP: 10 (ref 5–15)
BUN: 15 mg/dL (ref 6–20)
CALCIUM: 8.5 mg/dL — AB (ref 8.9–10.3)
CO2: 21 mmol/L — AB (ref 22–32)
Chloride: 111 mmol/L (ref 101–111)
Creatinine, Ser: 0.92 mg/dL (ref 0.44–1.00)
GFR calc non Af Amer: >60 mL/min (ref >60)
Glucose, Bld: 131 mg/dL — ABNORMAL HIGH (ref 65–99)
Potassium: 3.3 mmol/L — ABNORMAL LOW (ref 3.5–5.1)
SODIUM: 142 mmol/L (ref 135–145)

## 2016-07-31 LAB — CBG MONITORING, ED: GLUCOSE-CAPILLARY: 136 mg/dL — AB (ref 65–99)

## 2016-07-31 NOTE — ED Triage Notes (Signed)
Pt presents with dizziness and slurred speech x 2 months.  Pt reports that it has gotten worse.  Pt states she was dx with vertigo at [facility] 2 months ago.  Pt also reports her arms and hands are shaking so much she is unable to hold items.  Pt also states she has difficulties rememberings and ha mucus in her throat.  Pt has a headache rating it a 10 out of 10.  Pt a/o x 4 with PERRL. No obvious slurring of speech noted in triage.

## 2016-07-31 NOTE — ED Provider Notes (Signed)
[facility] DEPT Provider Note   CSN: [id number] Arrival date & time: 07/31/16  1422     History   Chief Complaint Chief Complaint  Patient presents with  . Dizziness    x 2 months  . Aphasia    x 2 months    HPI Krista Carter is a 61 y.o. female.  Patient being followed by [facility] neurology for the vertigo. Based on their note from April they feel that this is benign positional vertigo. Patient had a negative MRI of the brain March 9. They're trying therapies. They spoke to her on the phone today and are recommending a believe physical therapy to be done. The patient came in here thinking something else could be done as well. Today's labs without significant findings. No reason to repeat MRI based on negative MRI March 9. [facility] neurology documents that the speech difficulty and the vertigo has been present for over 6 weeks prior to the time that they saw her. Patient without any new or worse symptoms. Patient states that the current therapies just not working.      Past Medical History:  Diagnosis Date  . Adrenal adenoma 02/2015   left, seen on CT.   Marland Kitchen Anemia 07/2010, 04/2015   normocytic.   Marland Kitchen Depression with anxiety 2008  . Diabetes mellitus 2011   type 2.   . Duodenitis 2005   [place] also seen on EGD.   Marland Kitchen Dyslipidemia 2011  . Expressive aphasia 03/2010   subjective word finding difficulty and slurred speach.  MRI/MRA, CT scan: mild small vessel disease, mild narrowing bilateral int carotids and RCA  . Hydronephrosis 2010   per ultrasound: mild, chronic, non-obstructing, bilateral.  . Hypertension 2011  . IBS (irritable bowel syndrome) 2008   diarrhea prone  . Mitral valve regurgitation 03/2010   Mild MVR, EF 60-65% per echo.  . Multiple gastric ulcers 09/2013   Benign, due to ASA powders.  H Pylori negative.    . Renal insufficiency 2014   AKI 10/2012 due to dehydration, acute (probably infectious/viral) colitis, ACE use.   . Severe protein-calorie malnutrition  ([place]) 10/2012  . Tibia/fibula fracture 04/2015   left.  Dital tibia, proximal fibula.     Patient Active Problem List   Diagnosis Date Noted  . Benign positional vertigo 07/05/2016  . Slurred speech 07/05/2016  . Gait disturbance 07/05/2016  . Acute upper GI bleed 04/14/2015  . Gastric ulcer with hemorrhage 04/14/2015  . Closed left fibular fracture 04/14/2015  . Faintness   . Acute gastric ulcer   . History of peptic ulcer disease   . Emesis   . Hematemesis with nausea   . Syncope 04/11/2015  . Dizziness 11/27/2013  . Chest pain 11/27/2013  . AKI (acute kidney injury) ([place]) 11/27/2013  . Dehydration 11/27/2013  . Hypotension 11/27/2013  . Loss of weight 08/17/2013  . Diarrhea 08/17/2013  . Nausea alone 08/17/2013  . Generalized abdominal pain 08/17/2013  . Protein-calorie malnutrition, severe ([place]) 11/26/2012  . Nausea vomiting and diarrhea 11/25/2012  . Hypokalemia 11/25/2012  . Acute renal failure ([place]) 11/25/2012  . APNEA 02/17/2010  . SNORING 02/17/2010  . OTH SPEC PERS HX PRESENTING HAZARDS HEALTH OTH 02/17/2010  . HYPERLIPIDEMIA 06/10/2007  . SMOKER 06/10/2007  . HYPERTENSION 06/10/2007  . GERD 06/10/2007  . IBS 06/10/2007  . RENAL CALCULUS 06/10/2007  . DUODENITIS 04/06/2003  . HIATAL HERNIA 04/06/2003    Past Surgical History:  Procedure Laterality Date  . ABDOMINAL HYSTERECTOMY    .  BONE MARROW BIOPSY  07/2010.    Benign fibroconnective and soft tissue with abundant acute and chronic inflammation, with foreign body multinucleated giant cell reaction. No atypia, no malignancy  . colonoscopies  2008, 09/2013   both normal.   . ESOPHAGOGASTRODUODENOSCOPY  2005, 09/2013, 10/2013  . ESOPHAGOGASTRODUODENOSCOPY (EGD) WITH PROPOFOL N/A 04/13/2015   Procedure: ESOPHAGOGASTRODUODENOSCOPY (EGD) WITH PROPOFOL;  Surgeon: Jerene Bears, MD;  Location: [facility];  Service: Endoscopy;  Laterality: N/A;  . ORIF NONUNION HUMERUS FRACTURE  07/2010   with bone graft.     OB  History    No data available       Home Medications    Prior to Admission medications   Medication Sig Start Date End Date Taking? Authorizing Provider  aspirin EC 81 MG tablet Take 81 mg by mouth daily.     Yes Historical Provider, MD  clonazePAM (KLONOPIN) 0.5 MG tablet Take 0.5 mg by mouth 2 (two) times daily as needed. Anxiety 02/01/15  Yes Historical Provider, MD  diazepam (VALIUM) 5 MG tablet Take 0.5-1 tablets (2.5-5 mg total) by mouth every 8 (eight) hours as needed (vertigo). 07/05/16  Yes Britt Bottom, MD  lisinopril (PRINIVIL,ZESTRIL) 10 MG tablet Take 10 mg by mouth daily.   Yes Historical Provider, MD  meclizine (ANTIVERT) 25 MG tablet Take 1 tablet (25 mg total) by mouth 3 (three) times daily as needed for dizziness. 05/18/16  Yes Sherwood Gambler, MD  metoprolol succinate (TOPROL-XL) 100 MG 24 hr tablet Take 100 mg by mouth daily. 02/02/15  Yes Historical Provider, MD  nicotine (NICODERM CQ) 21 mg/24hr patch Place 1 patch (21 mg total) onto the skin daily. 04/14/15  Yes [name] Dhungel, MD  oxyCODONE (OXY IR/ROXICODONE) 5 MG immediate release tablet Take 1 tablet (5 mg total) by mouth every 4 (four) hours as needed for moderate pain. 04/14/15  Yes [name] Dhungel, MD  pantoprazole (PROTONIX) 40 MG tablet Take 1 tablet (40 mg total) by mouth 2 (two) times daily. 04/14/15  Yes [name] Dhungel, MD  predniSONE (DELTASONE) 10 MG tablet Take 6 po x 1d, 5 po x 1d, 4 po x 1d, 3 po x 1d, 2 po x 1d, 1 po x1d 07/05/16  Yes Britt Bottom, MD  sucralfate (CARAFATE) 1 g tablet Take 1 tablet (1 g total) by mouth 4 (four) times daily -  with meals and at bedtime. 04/14/15  Yes [name] Dhungel, MD  zolpidem (AMBIEN) 10 MG tablet Take 10 mg by mouth at bedtime as needed for sleep. 06/26/16  Yes Historical Provider, MD    Family History Family History  Problem Relation Age of Onset  . Stroke Mother   . Kidney cancer Mother   . Colon cancer Maternal Uncle     Social History Social History    Substance Use Topics  . Smoking status: Current Every Day Smoker    Packs/day: 1.00    Types: Cigarettes  . Smokeless tobacco: Never Used  . Alcohol use Yes     Comment: "just socially"     Allergies   Patient has no known allergies.   Review of Systems Review of Systems  Constitutional: Negative for fever.  HENT: Positive for congestion.   Eyes: Negative for redness.  Respiratory: Negative for shortness of breath.   Cardiovascular: Negative for chest pain.  Gastrointestinal: Negative for abdominal pain, nausea and vomiting.  Genitourinary: Negative for dysuria.  Musculoskeletal: Negative for neck pain.  Neurological: Positive for dizziness and speech difficulty. Negative for  headaches.  Hematological: Does not bruise/bleed easily.  Psychiatric/Behavioral: Negative for confusion.     Physical Exam Updated Vital Signs BP 102/66 (BP Location: Left Arm)   Pulse 68   Temp 98.3 F (36.8 C) (Oral)   Resp 18   SpO2 96%   Physical Exam  Constitutional: She is oriented to person, place, and time. She appears well-developed and well-nourished. No distress.  HENT:  Head: Normocephalic and atraumatic.  Mouth/Throat: Oropharynx is clear and moist.  Eyes: EOM are normal. Pupils are equal, round, and reactive to light.  Neck: Normal range of motion. Neck supple.  Cardiovascular: Normal rate, regular rhythm and normal heart sounds.   Pulmonary/Chest: Effort normal and breath sounds normal. No respiratory distress.  Abdominal: Soft. Bowel sounds are normal. There is no tenderness.  Musculoskeletal: Normal range of motion.  Neurological: She is alert and oriented to person, place, and time. No cranial nerve deficit or sensory deficit. She exhibits normal muscle tone. Coordination normal.  Skin: Skin is warm.  Nursing note and vitals reviewed.    ED Treatments / Results  Labs (all labs ordered are listed, but only abnormal results are displayed) Labs Reviewed  BASIC  METABOLIC PANEL - Abnormal; Notable for the following:       Result Value   Potassium 3.3 (*)    CO2 21 (*)    Glucose, Bld 131 (*)    Calcium 8.5 (*)    All other components within normal limits  CBC - Abnormal; Notable for the following:    RDW 16.6 (*)    All other components within normal limits  URINALYSIS, ROUTINE W REFLEX MICROSCOPIC - Abnormal; Notable for the following:    Specific Gravity, Urine 1.038 (*)    Bilirubin Urine SMALL (*)    Ketones, ur 5 (*)    Protein, ur 30 (*)    Bacteria, UA RARE (*)    Squamous Epithelial / LPF 0-5 (*)    All other components within normal limits  CBG MONITORING, ED - Abnormal; Notable for the following:    Glucose-Capillary 136 (*)    All other components within normal limits    EKG  EKG Interpretation None       Radiology No results found.  Procedures Procedures (including critical care time)  Medications Ordered in ED Medications - No data to display   Initial Impression / Assessment and Plan / ED Course  I have reviewed the triage vital signs and the nursing notes.  Pertinent labs & imaging results that were available during my care of the patient were reviewed by me and considered in my medical decision making (see chart for details).    Previous MRI done on March 9 and [place] neurology appointment in the beginning of April reviewed. The symptoms have been present for more than 6 weeks. Neurology convinced that this is a benign postural vertigo. They worked out a Water engineer. The next step is consideration for physical therapy. Patient advised to call them for follow-up. Continue her current meds. Today's labs without significant abnormalities. Patient nontoxic no acute distress.  Patient's complaint of slurred speech was also documented by neurology to be present 6 weeks prior to when they saw her in April.   Final Clinical Impressions(s) / ED Diagnoses   Final diagnoses:  Vertigo    New Prescriptions New  Prescriptions   No medications on file     Fredia Sorrow, MD 07/31/16 1844

## 2016-07-31 NOTE — Discharge Instructions (Signed)
Call neurology for follow-up and consideration for the advised physical therapy. Continue your current medications. Today's labs without significant abnormality.

## 2016-08-02 NOTE — Telephone Encounter (Signed)
Pt would like a call back from Faith re: her vertigo. And pursuing physical therapy. Please call

## 2016-08-02 NOTE — Addendum Note (Signed)
Addended by: France Ravens I on: 08/02/2016 11:56 AM   Modules accepted: Orders

## 2016-08-02 NOTE — Telephone Encounter (Signed)
I have spoken with Krista Carter. She requests referral to PT for vestibular rehab.  Order placed in EPIC?fim

## 2016-08-02 NOTE — Telephone Encounter (Signed)
LMTC./fim 

## 2016-08-06 ENCOUNTER — Ambulatory Visit: Payer: BC Managed Care – PPO | Attending: Neurology | Admitting: Rehabilitative and Restorative Service Providers"

## 2016-08-06 DIAGNOSIS — R2681 Unsteadiness on feet: Secondary | ICD-10-CM | POA: Diagnosis present

## 2016-08-06 DIAGNOSIS — R2689 Other abnormalities of gait and mobility: Secondary | ICD-10-CM | POA: Diagnosis not present

## 2016-08-06 DIAGNOSIS — H8111 Benign paroxysmal vertigo, right ear: Secondary | ICD-10-CM | POA: Diagnosis present

## 2016-08-06 DIAGNOSIS — R42 Dizziness and giddiness: Secondary | ICD-10-CM | POA: Insufficient documentation

## 2016-08-06 NOTE — Therapy (Signed)
[facility] [address] [place], [place], [address] Phone: [phone]   Fax:  [phone]  Physical Therapy Evaluation  Patient Details  Name: Krista Carter MRN: [id number] Date of Birth: Nov 03, 1955 Referring Provider: Arlice Colt, MD  Encounter Date: 08/06/2016      PT End of Session - 08/06/16 1439    Visit Number 1   Number of Visits 16   Date for PT Re-Evaluation 10/05/16   Authorization Type private insurance   PT Start Time 1150   PT Stop Time 1232   PT Time Calculation (min) 42 min   Activity Tolerance Patient tolerated treatment well   Behavior During Therapy [facility] for tasks assessed/performed      Past Medical History:  Diagnosis Date  . Adrenal adenoma 02/2015   left, seen on CT.   Marland Kitchen Anemia 07/2010, 04/2015   normocytic.   Marland Kitchen Depression with anxiety 2008  . Diabetes mellitus 2011   type 2.   . Duodenitis 2005   [place] also seen on EGD.   Marland Kitchen Dyslipidemia 2011  . Expressive aphasia 03/2010   subjective word finding difficulty and slurred speach.  MRI/MRA, CT scan: mild small vessel disease, mild narrowing bilateral int carotids and RCA  . Hydronephrosis 2010   per ultrasound: mild, chronic, non-obstructing, bilateral.  . Hypertension 2011  . IBS (irritable bowel syndrome) 2008   diarrhea prone  . Mitral valve regurgitation 03/2010   Mild MVR, EF 60-65% per echo.  . Multiple gastric ulcers 09/2013   Benign, due to ASA powders.  H Pylori negative.    . Renal insufficiency 2014   AKI 10/2012 due to dehydration, acute (probably infectious/viral) colitis, ACE use.   . Severe protein-calorie malnutrition ([place]) 10/2012  . Tibia/fibula fracture 04/2015   left.  Dital tibia, proximal fibula.     Past Surgical History:  Procedure Laterality Date  . ABDOMINAL HYSTERECTOMY    . BONE MARROW BIOPSY  07/2010.    Benign fibroconnective and soft tissue with abundant acute and chronic inflammation, with foreign body  multinucleated giant cell reaction. No atypia, no malignancy  . colonoscopies  2008, 09/2013   both normal.   . ESOPHAGOGASTRODUODENOSCOPY  2005, 09/2013, 10/2013  . ESOPHAGOGASTRODUODENOSCOPY (EGD) WITH PROPOFOL N/A 04/13/2015   Procedure: ESOPHAGOGASTRODUODENOSCOPY (EGD) WITH PROPOFOL;  Surgeon: Jerene Bears, MD;  [facility];  Service: Endoscopy;  Laterality: N/A;  . ORIF NONUNION HUMERUS FRACTURE  07/2010   with bone graft.     There were no vitals filed for this visit.       Subjective Assessment - 08/06/16 1154    Subjective The patient notes sudden onset of dizziness 3 months ago described as room spinning, bed spinning, imbalance, supressed appetite and slurred speech.    She notes she is having falls and is no longer able to drive.  She also notes that she is dropping things reporting general "shakiness".  She reports her thinking is also off.  She notes some hearing changes reporting "I can't hardly hear", she reports visual blurring worse with moving.  She also notes headaches after getting spinning when getting out of bed.    Pertinent History reviewed in epic   Patient Stated Goals Get rid of all of this dizziness to get back to normal self.  "I don't like it."   Currently in Pain? Yes   Pain Score --  "can't describe it, it just hurts."   Pain Location Back   Pain Descriptors /  Indicators Aching   Effect of Pain on Daily Activities PT to monitor-- no goal to follow due to nature of referral.            [facility] PT Assessment - 08/06/16 1204      Assessment   Medical Diagnosis vertigo, imbalance   Referring Provider Arlice Colt, MD   Onset Date/Surgical Date --  05/2016   Hand Dominance Right   Prior Therapy none     Precautions   Precautions Fall     Restrictions   Weight Bearing Restrictions No     Balance Screen   Has the patient fallen in the past 6 months Yes   How many times? broke leg in past (unable to give date); "several" unable to give #    Has the patient had a decrease in activity level because of a fear of falling?  Yes   Is the patient reluctant to leave their home because of a fear of falling?  Yes     [place] residence   Living Arrangements Other relatives  sister   Type of [place] to enter   Entrance Stairs-Number of Steps 4   Entrance Stairs-Rails Can reach both   [place] One level   [place] - single point   Additional Comments uses device intermittently     Prior Function   Level of [place] Retired     Associate Professor   Overall Cognitive Status Impaired/Different from baseline  notes memory worse, hard to answer questions     Observation/Other Assessments   Focus on Therapeutic Outcomes (FOTO)  48%   Other Surveys  Other Surveys   Dizziness Handicap Inventory ([place])  94% indicating severe disability from dizziness     Sensation   Light Touch Appears Intact     Ambulation/Gait   Ambulation/Gait Yes   Ambulation/Gait Assistance 4: Min guard   Ambulation Distance (Feet) 100 Feet   Assistive device None   Gait Pattern Decreased stride length;Ataxic;Wide base of support   Ambulation Surface Level   Gait velocity to be assessed*   Gait Comments The patient needs CGA to maintain upright during gait activities.  She reaches out for wall for extra support.            Vestibular Assessment - 08/06/16 1208      Vestibular Assessment   General Observation Patient notes "ocean" sounds in ears.     Symptom Behavior   Type of Dizziness Imbalance  oscillopsia, spinning, unsteadiness   Frequency of Dizziness daily   Duration of Dizziness can improve at times during the day   Aggravating Factors Mornings;Turning body quickly;Turning head quickly;Looking up to the ceiling;Activity in general   Relieving Factors Head stationary     Occulomotor Exam   Occulomotor Alignment Normal  patient has eyelid  muscle twitching noted during exam   Spontaneous Absent   Gaze-induced Absent   Smooth Pursuits Saccades  note catch up saccades in vertical plane   Saccades Dysmetria  note overshooting for saccades     Vestibulo-Occular Reflex   VOR 1 Head Only (x 1 viewing) slow gaze x 1 provokes visual blurring   VOR Cancellation Unable to maintain gaze   Comment head impulse test=positive R head impulse testing reveals large amplitude refixation saccade to target, low amplitude to the left.     Positional Testing   Dix-Hallpike Dix-Hallpike Right;Dix-Hallpike Left  Sidelying Test Sidelying Right;Sidelying Left   Horizontal Canal Testing Horizontal Canal Right;Horizontal Canal Left     Dix-Hallpike Right   Dix-Hallpike Right Duration 20 seconds   Dix-Hallpike Right Symptoms Upbeat, right rotatory nystagmus     Dix-Hallpike Left   Dix-Hallpike Left Duration none   Dix-Hallpike Left Symptoms No nystagmus     Sidelying Right   Sidelying Right Duration x 15 seconds   Sidelying Right Symptoms Upbeat, right rotatory nystagmus     Sidelying Left   Sidelying Left Duration none   Sidelying Left Symptoms No nystagmus     Horizontal Canal Right   Horizontal Canal Right Duration mild sensation of dizziness   Horizontal Canal Right Symptoms --  note rotary nystagmus *maybe from post BPPV     Horizontal Canal Left   Horizontal Canal Left Duration none   Horizontal Canal Left Symptoms Normal               [facility] Adult PT Treatment/Exercise - 08/06/16 1204      Self-Care   Self-Care Other Self-Care Comments   Other Self-Care Comments  Discussed fall prevention recommending use of flat shoes (sneakers) as patient presents to PT with boots with small heel.  REcommended use of RW at home due to frequent falls and fall risk per walking/needing support today.  Discussed/educated patient on nature of BPPV, diminished VOR and instructed in home program.          Vestibular Treatment/Exercise  - 08/06/16 1224      Vestibular Treatment/Exercise   Vestibular Treatment Provided Canalith Repositioning;Gaze   Canalith Repositioning Epley Manuever Right   Gaze Exercises X1 Viewing Horizontal      EPLEY MANUEVER RIGHT   Number of Reps  1   Overall Response Symptoms Resolved   Response Details  no vertigo when retested     X1 Viewing Horizontal   Foot Position seated   Comments x 30 seconds with cues for visual fixation.               PT Education - 08/06/16 1439    Education provided Yes   Education Details HEP: gaze x 1 viewing.  See self care for patient education   Person(s) Educated Patient   Methods Explanation;Demonstration;Handout   Comprehension Verbalized understanding;Returned demonstration          PT Short Term Goals - 08/06/16 1440      PT SHORT TERM GOAL #1   Title The patient will be indep with HEP for gaze adaptation, balance, and general mobility.   Baseline Target date 09/06/2016   Time 4   Period Weeks     PT SHORT TERM GOAL #2   Title The patient will verbalize strategies to reduce fall risk.   Baseline Target date 09/06/2016   Time 4   Period Weeks     PT SHORT TERM GOAL #3   Title The patient will tolerate gaze x 1 viewing x 30 seconds without c/o visual blurring.   Baseline Target date 09/06/16   Time 4   Period Weeks     PT SHORT TERM GOAL #4   Title The patient will be negative for positional testing indicating resolution of BPPV.   Baseline Target date 09/06/16   Time 4   Period Weeks     PT SHORT TERM GOAL #5   Title Further assess gait speed, berg and write goals for LTGs.   Baseline Target date 09/06/16   Time 4   Period Weeks  PT Long Term Goals - 08/06/16 1446      PT LONG TERM GOAL #1   Title Reduce DHI from 94% to < or equal to 65% to demo dec'd self perception of vertigo.   Baseline Target date 10/06/16   Time 8   Period Weeks     PT LONG TERM GOAL #2   Title The patient will improve Berg x 5 points  over baseline established (after BPPV treated).   Baseline Target date 10/06/16   Time 8   Period Weeks     PT LONG TERM GOAL #3   Title The patient will improve gait speed by 0.5 ft/sec to demo improving mobility.   Baseline Target date 10/06/16   Time 8   Period Weeks     PT LONG TERM GOAL #4   Title The patient will tolerate gaze x 1 x 60 seconds standing without loss of balance for improved ability to maintain gaze with head motion.   Baseline Target date 10/06/16   Time 8   Period Weeks               Plan - 08/06/16 1447    Clinical Impression Statement The patient is a 61 year old female presenting to PT with multiple contributing factors to dizziness/imbalance.  She has positive R BPPV testing and symptoms resolved with Epley's maneuver at today's visit.  She also has positive head impulse test indicating diminished use of VOR, which correlates to reports of visual blurring during motion.  The patient's balance seems more impaired than I would expect with peripheral vertigo presentation.  She has other symptoms including saccadic eye pursuits during smooth pursuit testing, ataxic gait appearance, general "shakiness", frequent falls that may indicate central involvement.  Patient is being followed by neurology and MRI has been completed.   The patient also has polypharmacy with some medications on her list that may contribute to greater fall risk (oxycodone, valium, klonopin).  PT to work towards improving safety with mobility for improved functional status.    Rehab Potential Good   Clinical Impairments Affecting Rehab Potential polypharmacy, multiple factors   PT Frequency 2x / week   PT Duration 8 weeks   PT Treatment/Interventions ADLs/Self Care Home Management;Therapeutic activities;Therapeutic exercise;Balance training;Neuromuscular re-education;Patient/family education;Gait training;Stair training;Functional mobility training;DME Instruction;Vestibular;Canalith Repostioning    PT Next Visit Plan Check R BPPV, check gaze exercise, add balance HEP, safety during mobility   Consulted and Agree with Plan of Care Patient      Patient will benefit from skilled therapeutic intervention in order to improve the following deficits and impairments:  Abnormal gait, Difficulty walking, Decreased balance, Decreased mobility, Dizziness, Postural dysfunction  Visit Diagnosis: Other abnormalities of gait and mobility  Unsteadiness on feet  BPPV (benign paroxysmal positional vertigo), right  Dizziness and giddiness     Problem List Patient Active Problem List   Diagnosis Date Noted  . Benign positional vertigo 07/05/2016  . Slurred speech 07/05/2016  . Gait disturbance 07/05/2016  . Acute upper GI bleed 04/14/2015  . Gastric ulcer with hemorrhage 04/14/2015  . Closed left fibular fracture 04/14/2015  . Faintness   . Acute gastric ulcer   . History of peptic ulcer disease   . Emesis   . Hematemesis with nausea   . Syncope 04/11/2015  . Dizziness 11/27/2013  . Chest pain 11/27/2013  . AKI (acute kidney injury) ([place]) 11/27/2013  . Dehydration 11/27/2013  . Hypotension 11/27/2013  . Loss of weight 08/17/2013  . Diarrhea  08/17/2013  . Nausea alone 08/17/2013  . Generalized abdominal pain 08/17/2013  . Protein-calorie malnutrition, severe ([place]) 11/26/2012  . Nausea vomiting and diarrhea 11/25/2012  . Hypokalemia 11/25/2012  . Acute renal failure ([place]) 11/25/2012  . APNEA 02/17/2010  . SNORING 02/17/2010  . OTH SPEC PERS HX PRESENTING HAZARDS HEALTH OTH 02/17/2010  . HYPERLIPIDEMIA 06/10/2007  . SMOKER 06/10/2007  . HYPERTENSION 06/10/2007  . GERD 06/10/2007  . IBS 06/10/2007  . RENAL CALCULUS 06/10/2007  . DUODENITIS 04/06/2003  . HIATAL HERNIA 04/06/2003    Zahli Vetsch, PT 08/06/2016, 2:56 PM  [facility] [address] [place], [place], [address] Phone: [phone]   Fax:   [phone]  Name: Krista Carter MRN: [id number] Date of Birth: 1955-08-17

## 2016-08-06 NOTE — Patient Instructions (Signed)
Gaze Stabilization: Sitting    Keeping eyes on target on wall 3 feet away, and move head side to side for _30__ seconds. Repeat while moving head up and down for __30_ seconds. Do __2-3__ sessions per day.  Copyright  VHI. All rights reserved.   Gaze Stabilization: Tip Card  1.Target must remain in focus, not blurry, and appear stationary while head is in motion. 2.Perform exercises with small head movements (45 to either side of midline). 3.Increase speed of head motion so long as target is in focus. 4.If you wear eyeglasses, be sure you can see target through lens (therapist will give specific instructions for bifocal / progressive lenses). 5.These exercises may provoke dizziness or nausea. Work through these symptoms. If too dizzy, slow head movement slightly. Rest between each exercise. 6.Exercises demand concentration; avoid distractions.  Copyright  VHI. All rights reserved.     

## 2016-08-13 ENCOUNTER — Ambulatory Visit: Payer: BC Managed Care – PPO | Admitting: Rehabilitative and Restorative Service Providers"

## 2016-08-13 ENCOUNTER — Other Ambulatory Visit: Payer: Self-pay | Admitting: Neurology

## 2016-08-17 ENCOUNTER — Telehealth: Payer: Self-pay | Admitting: Rehabilitative and Restorative Service Providers"

## 2016-08-17 ENCOUNTER — Ambulatory Visit: Payer: BC Managed Care – PPO | Admitting: Rehabilitative and Restorative Service Providers"

## 2016-08-17 NOTE — Telephone Encounter (Signed)
PT called patient due to 2 no show visits.  She report she is doing worse.  "I've got slurring of speech, I drop stuff."  She notes she "can't motivate myself".   She said that she forgot about her appointments because her mind isn't right.  She is unable to answer questions about whether or not she wants visits cancelled.  PT cancelled Monday's visit and told her she needs to see the neurologist at 3pm.

## 2016-08-20 ENCOUNTER — Telehealth: Payer: Self-pay | Admitting: Neurology

## 2016-08-20 ENCOUNTER — Ambulatory Visit: Payer: BC Managed Care – PPO | Admitting: Neurology

## 2016-08-20 ENCOUNTER — Ambulatory Visit: Payer: BC Managed Care – PPO | Admitting: Rehabilitative and Restorative Service Providers"

## 2016-08-20 NOTE — Telephone Encounter (Signed)
Noted/fim 

## 2016-08-20 NOTE — Telephone Encounter (Signed)
Pt had to cancel appointment for today, she was made aware of cancellation fee of $50.00 and 3 times within a year.

## 2016-08-21 ENCOUNTER — Encounter: Payer: Self-pay | Admitting: Neurology

## 2016-08-23 ENCOUNTER — Ambulatory Visit: Payer: BC Managed Care – PPO | Admitting: Rehabilitative and Restorative Service Providers"

## 2016-08-23 ENCOUNTER — Encounter: Payer: Self-pay | Admitting: Rehabilitative and Restorative Service Providers"

## 2016-08-23 NOTE — Therapy (Signed)
[facility] [address] [place], [place], [address] Phone: [phone]   Fax:  [phone]  Patient Details  Name: Krista Carter MRN: [id number] Date of Birth: 07-Dec-1955 Referring Provider:  No ref. provider found  Encounter Date: last encounter 08/06/16  PHYSICAL THERAPY DISCHARGE SUMMARY  Visits from Start of Care: evaluation only  Current functional level related to goals / functional outcomes: See initial evaluation for patient status.   Remaining deficits: See eval-did not return for treatment.   Education / Equipment: Treated for BPPV at eval- see note.  Plan: Patient agrees to discharge.  Patient goals were not met. Patient is being discharged due to not returning since the last visit.  ?????        Thank you for the referral of this patient. Rudell Cobb, MPT   Krista Carter 08/23/2016, 2:24 PM  [facility] [address] [place], [place], [address] Phone: [phone]   Fax:  [phone]

## 2016-08-28 ENCOUNTER — Encounter: Payer: BC Managed Care – PPO | Admitting: Rehabilitative and Restorative Service Providers"

## 2016-09-05 ENCOUNTER — Encounter: Payer: BC Managed Care – PPO | Admitting: Rehabilitative and Restorative Service Providers"

## 2017-02-08 ENCOUNTER — Other Ambulatory Visit: Payer: Self-pay | Admitting: Family Medicine

## 2017-03-01 ENCOUNTER — Other Ambulatory Visit: Payer: Self-pay | Admitting: Family Medicine

## 2018-03-21 ENCOUNTER — Emergency Department (HOSPITAL_COMMUNITY)
Admission: EM | Admit: 2018-03-21 | Discharge: 2018-03-21 | Disposition: A | Discharge status: Home / Self Care | Payer: BC Managed Care – PPO | Attending: Emergency Medicine | Admitting: Emergency Medicine

## 2018-03-21 ENCOUNTER — Other Ambulatory Visit: Payer: Self-pay

## 2018-03-21 ENCOUNTER — Encounter (HOSPITAL_COMMUNITY): Payer: Self-pay | Admitting: Emergency Medicine

## 2018-03-21 DIAGNOSIS — Z79899 Other long term (current) drug therapy: Secondary | ICD-10-CM | POA: Insufficient documentation

## 2018-03-21 DIAGNOSIS — R21 Rash and other nonspecific skin eruption: Secondary | ICD-10-CM | POA: Diagnosis present

## 2018-03-21 DIAGNOSIS — F17210 Nicotine dependence, cigarettes, uncomplicated: Secondary | ICD-10-CM | POA: Diagnosis not present

## 2018-03-21 DIAGNOSIS — L0100 Impetigo, unspecified: Secondary | ICD-10-CM | POA: Diagnosis not present

## 2018-03-21 DIAGNOSIS — I10 Essential (primary) hypertension: Secondary | ICD-10-CM | POA: Insufficient documentation

## 2018-03-21 DIAGNOSIS — Z7982 Long term (current) use of aspirin: Secondary | ICD-10-CM | POA: Insufficient documentation

## 2018-03-21 DIAGNOSIS — E119 Type 2 diabetes mellitus without complications: Secondary | ICD-10-CM | POA: Insufficient documentation

## 2018-03-21 MED ORDER — IBUPROFEN 400 MG PO TABS
400.0000 mg | ORAL_TABLET | Freq: Once | ORAL | Status: AC
Start: 2018-03-21 — End: 2018-03-21
  Administered 2018-03-21: 400 mg via ORAL
  Filled 2018-03-21: qty 1

## 2018-03-21 MED ORDER — DOXYCYCLINE HYCLATE 100 MG PO CAPS: 100.0000 mg | ORAL_CAPSULE | Freq: Two times a day (BID) | ORAL | 0 refills | Status: DC

## 2018-03-21 MED ORDER — IBUPROFEN 400 MG PO TABS: 400.0000 mg | ORAL_TABLET | Freq: Four times a day (QID) | ORAL | 0 refills | Status: DC | PRN

## 2018-03-21 MED ORDER — DOXYCYCLINE HYCLATE 100 MG PO TABS
100.0000 mg | ORAL_TABLET | Freq: Once | ORAL | Status: AC
  Administered 2018-03-21: 100 mg via ORAL
  Filled 2018-03-21: qty 1

## 2018-03-21 NOTE — ED Notes (Signed)
Patient has blistering spots on her face and right middle finger. States they hurt and burn, and she wants something to ease the pain.

## 2018-03-21 NOTE — Discharge Instructions (Signed)
Take the antibiotics as prescribed, follow-up with your primary care doctor next week to be rechecked if the rash has not significantly improved, return to the emergency room for fevers worsening symptoms

## 2018-03-21 NOTE — ED Triage Notes (Signed)
Patient complaining of rash to face and right hand x 1 week that is painful.

## 2018-03-21 NOTE — ED Provider Notes (Signed)
[facility] Provider Note   CSN: [id number] Arrival date & time: 03/21/18  1301     History   Chief Complaint Chief Complaint  Patient presents with  . Rash    HPI Krista Carter is a 62 y.o. female.  HPI Patient presents to the emergency room for evaluation of a rash on her face.  Patient has also noticed a lesion on her right finger.  Patient states it started about a week ago.  She is noticed some breakdown in the skin.  It is tender to the touch.  She has not noticed any pustules.  No fevers or chills. Past Medical History:  Diagnosis Date  . Adrenal adenoma 02/2015   left, seen on CT.   Marland Kitchen Anemia 07/2010, 04/2015   normocytic.   Marland Kitchen Depression with anxiety 2008  . Diabetes mellitus 2011   type 2.   . Duodenitis 2005   Dresser also seen on EGD.   Marland Kitchen Dyslipidemia 2011  . Expressive aphasia 03/2010   subjective word finding difficulty and slurred speach.  MRI/MRA, CT scan: mild small vessel disease, mild narrowing bilateral int carotids and RCA  . Hydronephrosis 2010   per ultrasound: mild, chronic, non-obstructing, bilateral.  . Hypertension 2011  . IBS (irritable bowel syndrome) 2008   diarrhea prone  . Mitral valve regurgitation 03/2010   Mild MVR, EF 60-65% per echo.  . Multiple gastric ulcers 09/2013   Benign, due to ASA powders.  H Pylori negative.    . Renal insufficiency 2014   AKI 10/2012 due to dehydration, acute (probably infectious/viral) colitis, ACE use.   . Severe protein-calorie malnutrition ([facility]) 10/2012  . Tibia/fibula fracture 04/2015   left.  Dital tibia, proximal fibula.     Patient Active Problem List   Diagnosis Date Noted  . Benign positional vertigo 07/05/2016  . Slurred speech 07/05/2016  . Gait disturbance 07/05/2016  . Acute upper GI bleed 04/14/2015  . Gastric ulcer with hemorrhage 04/14/2015  . Closed left fibular fracture 04/14/2015  . Faintness   . Acute gastric ulcer   . History of peptic ulcer disease   . Emesis     . Hematemesis with nausea   . Syncope 04/11/2015  . Dizziness 11/27/2013  . Chest pain 11/27/2013  . AKI (acute kidney injury) ([facility]) 11/27/2013  . Dehydration 11/27/2013  . Hypotension 11/27/2013  . Loss of weight 08/17/2013  . Diarrhea 08/17/2013  . Nausea alone 08/17/2013  . Generalized abdominal pain 08/17/2013  . Protein-calorie malnutrition, severe ([facility]) 11/26/2012  . Nausea vomiting and diarrhea 11/25/2012  . Hypokalemia 11/25/2012  . Acute renal failure ([facility]) 11/25/2012  . APNEA 02/17/2010  . SNORING 02/17/2010  . OTH SPEC PERS HX PRESENTING HAZARDS HEALTH OTH 02/17/2010  . HYPERLIPIDEMIA 06/10/2007  . SMOKER 06/10/2007  . HYPERTENSION 06/10/2007  . GERD 06/10/2007  . IBS 06/10/2007  . RENAL CALCULUS 06/10/2007  . DUODENITIS 04/06/2003  . HIATAL HERNIA 04/06/2003    Past Surgical History:  Procedure Laterality Date  . ABDOMINAL HYSTERECTOMY    . BONE MARROW BIOPSY  07/2010.    Benign fibroconnective and soft tissue with abundant acute and chronic inflammation, with foreign body multinucleated giant cell reaction. No atypia, no malignancy  . colonoscopies  2008, 09/2013   both normal.   . ESOPHAGOGASTRODUODENOSCOPY  2005, 09/2013, 10/2013  . ESOPHAGOGASTRODUODENOSCOPY (EGD) WITH PROPOFOL N/A 04/13/2015   Procedure: ESOPHAGOGASTRODUODENOSCOPY (EGD) WITH PROPOFOL;  Surgeon: Jerene Bears, MD;  Location: [facility];  Service: Endoscopy;  Laterality: N/A;  . ORIF NONUNION HUMERUS FRACTURE  07/2010   with bone graft.      OB History   No obstetric history on file.      Home Medications    Prior to Admission medications   Medication Sig Start Date End Date Taking? Authorizing Provider  aspirin EC 81 MG tablet Take 81 mg by mouth daily.      [provider]  clonazePAM (KLONOPIN) 0.5 MG tablet Take 0.5 mg by mouth 2 (two) times daily as needed. Anxiety 02/01/15   [provider]  diazepam (VALIUM) 5 MG tablet TAKE 1/2 TO 1 (ONE-HALF TO ONE) TABLET BY  MOUTH EVERY 8 HOURS AS NEEDED (VERTIGO) 08/14/16   Sater, [name], MD  doxycycline (VIBRAMYCIN) 100 MG capsule Take 1 capsule (100 mg total) by mouth 2 (two) times daily. 03/21/18   [name], MD  ibuprofen (ADVIL,MOTRIN) 400 MG tablet Take 1 tablet (400 mg total) by mouth every 6 (six) hours as needed. 03/21/18   [name], MD  lisinopril (PRINIVIL,ZESTRIL) 10 MG tablet Take 10 mg by mouth daily.    [provider]  meclizine (ANTIVERT) 25 MG tablet Take 1 tablet (25 mg total) by mouth 3 (three) times daily as needed for dizziness. 05/18/16   Sherwood Gambler, MD  metoprolol succinate (TOPROL-XL) 100 MG 24 hr tablet Take 100 mg by mouth daily. 02/02/15   [provider]  nicotine (NICODERM CQ) 21 mg/24hr patch Place 1 patch (21 mg total) onto the skin daily. 04/14/15   Dhungel, [name], MD  oxyCODONE (OXY IR/ROXICODONE) 5 MG immediate release tablet Take 1 tablet (5 mg total) by mouth every 4 (four) hours as needed for moderate pain. 04/14/15   Dhungel, [name], MD  pantoprazole (PROTONIX) 40 MG tablet Take 1 tablet (40 mg total) by mouth 2 (two) times daily. 04/14/15   Dhungel, Flonnie Overman, MD  predniSONE (DELTASONE) 10 MG tablet Take 6 po x 1d, 5 po x 1d, 4 po x 1d, 3 po x 1d, 2 po x 1d, 1 po x1d 07/05/16   Sater, [name], MD  sucralfate (CARAFATE) 1 g tablet Take 1 tablet (1 g total) by mouth 4 (four) times daily -  with meals and at bedtime. 04/14/15   Dhungel, [name], MD  zolpidem (AMBIEN) 10 MG tablet Take 10 mg by mouth at bedtime as needed for sleep. 06/26/16   [provider]    Family History Family History  Problem Relation Age of Onset  . Stroke Mother   . Kidney cancer Mother   . Colon cancer Maternal Uncle     Social History Social History   Tobacco Use  . Smoking status: Current Every Day Smoker    Packs/day: 1.00    Types: Cigarettes  . Smokeless tobacco: Never Used  Substance Use Topics  . Alcohol use: Yes    Comment: "just socially"  . Drug use:  No     Allergies   Patient has no known allergies.   Review of Systems Review of Systems  Constitutional: Negative for fever.  All other systems reviewed and are negative.    Physical Exam Updated Vital Signs BP (!) 157/101 (BP Location: Right Arm)   Pulse 81   Temp 99.6 F (37.6 C) (Oral)   Resp 16   Ht 1.626 m (_0 )   Wt 61.2 kg   SpO2 100%   BMI 23.17 kg/m   Physical Exam Vitals signs and nursing note reviewed.  Constitutional:  General: She is not in acute distress.    Appearance: She is well-developed.  HENT:     Head: Normocephalic and atraumatic.     Right Ear: External ear normal.     Left Ear: External ear normal.  Eyes:     General: No scleral icterus.       Right eye: No discharge.        Left eye: No discharge.     Conjunctiva/sclera: Conjunctivae normal.  Neck:     Musculoskeletal: Neck supple.     Trachea: No tracheal deviation.  Cardiovascular:     Rate and Rhythm: Normal rate.  Pulmonary:     Effort: Pulmonary effort is normal. No respiratory distress.     Breath sounds: No stridor.  Abdominal:     General: There is no distension.  Skin:    General: Skin is warm and dry.     Findings: Rash present.     Comments: Perioral rash with some erythema and appears to have some yellowish crusting drainage, small lesion noticed on the hand as well, no vesicles, no pustules, the rash does cross the midline  Neurological:     Mental Status: She is alert.     Cranial Nerves: Cranial nerve deficit: no gross deficits.      ED Treatments / Results  Labs (all labs ordered are listed, but only abnormal results are displayed) Labs Reviewed - No data to display   Procedures Procedures (including critical care time)  Medications Ordered in ED Medications  doxycycline (VIBRA-TABS) tablet 100 mg (100 mg Oral Given 03/21/18 1726)  ibuprofen (ADVIL,MOTRIN) tablet 400 mg (400 mg Oral Given 03/21/18 1726)     Initial Impression / Assessment  and Plan / ED Course  I have reviewed the triage vital signs and the nursing notes.  Pertinent labs & imaging results that were available during my care of the patient were reviewed by me and considered in my medical decision making (see chart for details).   Patient's rash is not consistent with shingles.  It crosses the midline.  Is not vesicular.  She also has rash on her face as well as her hand.  Seems somewhat consistent with impetigo.  I will have her start a course of doxycycline.  Final Clinical Impressions(s) / ED Diagnoses   Final diagnoses:  Impetigo    ED Discharge Orders         Ordered    doxycycline (VIBRAMYCIN) 100 MG capsule  2 times daily     03/21/18 1737    ibuprofen (ADVIL,MOTRIN) 400 MG tablet  Every 6 hours PRN     03/21/18 1737           [name], MD 03/21/18 Johnnye Lana

## 2018-03-21 NOTE — ED Notes (Signed)
Call from [facility] who identifies as infection nurse  Ask that order be written for [place] precautions

## 2019-04-28 ENCOUNTER — Ambulatory Visit
Admission: EM | Admit: 2019-04-28 | Discharge: 2019-04-28 | Disposition: A | Discharge status: Home / Self Care | Payer: BC Managed Care – PPO | Attending: Emergency Medicine | Admitting: Emergency Medicine

## 2019-04-28 ENCOUNTER — Other Ambulatory Visit: Payer: Self-pay

## 2019-04-28 DIAGNOSIS — Z20822 Contact with and (suspected) exposure to covid-19: Secondary | ICD-10-CM

## 2019-04-28 MED ORDER — BENZONATATE 100 MG PO CAPS: 100.0000 mg | ORAL_CAPSULE | Freq: Three times a day (TID) | ORAL | 0 refills | Status: DC

## 2019-04-28 MED ORDER — FLUTICASONE PROPIONATE 50 MCG/ACT NA SUSP: 1.0000 | Freq: Every day | NASAL | 0 refills | Status: DC

## 2019-04-28 NOTE — Discharge Instructions (Addendum)
COVID testing ordered.  It will take between 2-7 days for test results.  Someone will contact you regarding abnormal results.    In the meantime: You should remain isolated in your home for 10 days from symptom onset AND greater than 72 hours after symptoms resolution (absence of fever without the use of fever-reducing medication and improvement in respiratory symptoms), whichever is longer Get plenty of rest and push fluids Tessalon Perles prescribed for cough Flonase prescribed for nasal congestion and runny nose Use medications daily for symptom relief Use OTC medications like ibuprofen or tylenol as needed fever or pain Call or go to the ED if you have any new or worsening symptoms such as fever, worsening cough, shortness of breath, chest tightness, chest pain, turning blue, changes in mental status, etc..Marland Kitchen

## 2019-04-28 NOTE — ED Provider Notes (Signed)
[facility]    CSN: [id number] Arrival date & time: 04/28/19  1051      History   Chief Complaint Chief Complaint  Patient presents with  . covid exp, mild cough    HPI Krista Carter is a 63 y.o. female.   who presents with a complaint of fatigue, cough for the past 2 days.  Reported positive Covid exposure.  Denies sick exposure to flu or strep.  Denies recent travel.  Denies aggravating or alleviating symptoms.  Denies previous COVID infection.   Denies fever, chills, nasal congestion, rhinorrhea, sore throat,  SOB, wheezing, chest pain, nausea, vomiting, changes in bowel or bladder habits.       Past Medical History:  Diagnosis Date  . Adrenal adenoma 02/2015   left, seen on CT.   . Anemia 07/2010, 04/2015   normocytic.   . Depression with anxiety 2008  . Diabetes mellitus 2011   type 2.   . Duodenitis 2005   HH also seen on EGD.   . Dyslipidemia 2011  . Expressive aphasia 03/2010   subjective word finding difficulty and slurred speach.  MRI/MRA, CT scan: mild small vessel disease, mild narrowing bilateral int carotids and RCA  . Hydronephrosis 2010   per ultrasound: mild, chronic, non-obstructing, bilateral.  . Hypertension 2011  . IBS (irritable bowel syndrome) 2008   diarrhea prone  . Mitral valve regurgitation 03/2010   Mild MVR, EF 60-65% per echo.  . Multiple gastric ulcers 09/2013   Benign, due to ASA powders.  H Pylori negative.    . Renal insufficiency 2014   AKI 10/2012 due to dehydration, acute (probably infectious/viral) colitis, ACE use.   . Severe protein-calorie malnutrition ([facility]) 10/2012  . Tibia/fibula fracture 04/2015   left.  Dital tibia, proximal fibula.     Patient Active Problem List   Diagnosis Date Noted  . Benign positional vertigo 07/05/2016  . Slurred speech 07/05/2016  . Gait disturbance 07/05/2016  . Acute upper GI bleed 04/14/2015  . Gastric ulcer with hemorrhage 04/14/2015  . Closed left fibular fracture 04/14/2015   . Faintness   . Acute gastric ulcer   . History of peptic ulcer disease   . Emesis   . Hematemesis with nausea   . Syncope 04/11/2015  . Dizziness 11/27/2013  . Chest pain 11/27/2013  . AKI (acute kidney injury) ([facility]) 11/27/2013  . Dehydration 11/27/2013  . Hypotension 11/27/2013  . Loss of weight 08/17/2013  . Diarrhea 08/17/2013  . Nausea alone 08/17/2013  . Generalized abdominal pain 08/17/2013  . Protein-calorie malnutrition, severe ([facility]) 11/26/2012  . Nausea vomiting and diarrhea 11/25/2012  . Hypokalemia 11/25/2012  . Acute renal failure ([facility]) 11/25/2012  . APNEA 02/17/2010  . SNORING 02/17/2010  . OTH SPEC PERS HX PRESENTING HAZARDS HEALTH OTH 02/17/2010  . HYPERLIPIDEMIA 06/10/2007  . SMOKER 06/10/2007  . HYPERTENSION 06/10/2007  . GERD 06/10/2007  . IBS 06/10/2007  . RENAL CALCULUS 06/10/2007  . DUODENITIS 04/06/2003  . HIATAL HERNIA 04/06/2003    Past Surgical History:  Procedure Laterality Date  . ABDOMINAL HYSTERECTOMY    . BONE MARROW BIOPSY  07/2010.    Benign fibroconnective and soft tissue with abundant acute and chronic inflammation, with foreign body multinucleated giant cell reaction. No atypia, no malignancy  . colonoscopies  2008, 09/2013   both normal.   . ESOPHAGOGASTRODUODENOSCOPY  2005, 09/2013, 10/2013  . ESOPHAGOGASTRODUODENOSCOPY (EGD) WITH PROPOFOL N/A 04/13/2015   Procedure: ESOPHAGOGASTRODUODENOSCOPY (EGD) WITH PROPOFOL;  Surgeon: Jay M   Pyrtle, MD;  Location: [facility];  Service: Endoscopy;  Laterality: N/A;  . ORIF NONUNION HUMERUS FRACTURE  07/2010   with bone graft.     OB History   No obstetric history on file.      Home Medications    Prior to Admission medications   Medication Sig Start Date End Date Taking? Authorizing Provider  aspirin EC 81 MG tablet Take 81 mg by mouth daily.      [provider]  benzonatate (TESSALON) 100 MG capsule Take 1 capsule (100 mg total) by mouth every 8 (eight) hours. 04/28/19   Avegno,  Komlanvi S, FNP  clonazePAM (KLONOPIN) 0.5 MG tablet Take 0.5 mg by mouth 2 (two) times daily as needed. Anxiety 02/01/15   [provider]  diazepam (VALIUM) 5 MG tablet TAKE 1/2 TO 1 (ONE-HALF TO ONE) TABLET BY MOUTH EVERY 8 HOURS AS NEEDED (VERTIGO) 08/14/16   Sater, Richard A, MD  doxycycline (VIBRAMYCIN) 100 MG capsule Take 1 capsule (100 mg total) by mouth 2 (two) times daily. 03/21/18   Knapp, Jon, MD  fluticasone (FLONASE) 50 MCG/ACT nasal spray Place 1 spray into both nostrils daily for 14 days. 04/28/19 05/12/19  Avegno, Komlanvi S, FNP  ibuprofen (ADVIL,MOTRIN) 400 MG tablet Take 1 tablet (400 mg total) by mouth every 6 (six) hours as needed. 03/21/18   Knapp, Jon, MD  lisinopril (PRINIVIL,ZESTRIL) 10 MG tablet Take 10 mg by mouth daily.    [provider]  meclizine (ANTIVERT) 25 MG tablet Take 1 tablet (25 mg total) by mouth 3 (three) times daily as needed for dizziness. 05/18/16   Goldston, Scott, MD  metoprolol succinate (TOPROL-XL) 100 MG 24 hr tablet Take 100 mg by mouth daily. 02/02/15   [provider]  nicotine (NICODERM CQ) 21 mg/24hr patch Place 1 patch (21 mg total) onto the skin daily. 04/14/15   Dhungel, [name], MD  oxyCODONE (OXY IR/ROXICODONE) 5 MG immediate release tablet Take 1 tablet (5 mg total) by mouth every 4 (four) hours as needed for moderate pain. 04/14/15   Dhungel, [name], MD  pantoprazole (PROTONIX) 40 MG tablet Take 1 tablet (40 mg total) by mouth 2 (two) times daily. 04/14/15   Dhungel, [name], MD  predniSONE (DELTASONE) 10 MG tablet Take 6 po x 1d, 5 po x 1d, 4 po x 1d, 3 po x 1d, 2 po x 1d, 1 po x1d 07/05/16   Sater, Richard A, MD  sucralfate (CARAFATE) 1 g tablet Take 1 tablet (1 g total) by mouth 4 (four) times daily -  with meals and at bedtime. 04/14/15   Dhungel, [name], MD  zolpidem (AMBIEN) 10 MG tablet Take 10 mg by mouth at bedtime as needed for sleep. 06/26/16   [provider]    Family History Family History  Problem  Relation Age of Onset  . Stroke Mother   . Kidney cancer Mother   . Colon cancer Maternal Uncle     Social History Social History   Tobacco Use  . Smoking status: Current Every Day Smoker    Packs/day: 1.00    Types: Cigarettes  . Smokeless tobacco: Never Used  Substance Use Topics  . Alcohol use: Yes    Comment: "just socially"  . Drug use: No     Allergies   Patient has no known allergies.   Review of Systems Review of Systems  Constitutional: Positive for fatigue.  HENT: Negative.   Respiratory: Positive for cough.   Cardiovascular: Negative.     Gastrointestinal: Negative.   Neurological: Negative.   All other systems reviewed and are negative.    Physical Exam Triage Vital Signs ED Triage Vitals  Enc Vitals Group     BP      Pulse      Resp      Temp      Temp src      SpO2      Weight      Height      Head Circumference      Peak Flow      Pain Score      Pain Loc      Pain Edu?      Excl. in GC?    No data found.  Updated Vital Signs BP (!) 153/99 (BP Location: Right Arm)   Pulse 96   Temp 98.8 F (37.1 C) (Oral)   Resp 16   SpO2 96%   Visual Acuity Right Eye Distance:   Left Eye Distance:   Bilateral Distance:    Right Eye Near:   Left Eye Near:    Bilateral Near:     Physical Exam Vitals and nursing note reviewed.  Constitutional:      General: She is not in acute distress.    Appearance: Normal appearance. She is normal weight. She is not ill-appearing or toxic-appearing.  HENT:     Head: Normocephalic.     Right Ear: Tympanic membrane, ear canal and external ear normal. There is no impacted cerumen.     Left Ear: Tympanic membrane, ear canal and external ear normal. There is no impacted cerumen.     Nose: Nose normal. No congestion.     Mouth/Throat:     Mouth: Mucous membranes are moist.     Pharynx: Oropharynx is clear. No oropharyngeal exudate or posterior oropharyngeal erythema.  Cardiovascular:     Rate and Rhythm:  Normal rate and regular rhythm.     Pulses: Normal pulses.     Heart sounds: Normal heart sounds. No murmur.  Pulmonary:     Effort: Pulmonary effort is normal. No respiratory distress.     Breath sounds: Normal breath sounds. No wheezing or rhonchi.  Chest:     Chest wall: No tenderness.  Abdominal:     General: Abdomen is flat. Bowel sounds are normal. There is no distension.     Palpations: There is no mass.     Tenderness: There is no abdominal tenderness.  Skin:    Capillary Refill: Capillary refill takes less than 2 seconds.  Neurological:     General: No focal deficit present.     Mental Status: She is alert and oriented to person, place, and time.      UC Treatments / Results  Labs (all labs ordered are listed, but only abnormal results are displayed) Labs Reviewed - No data to display  EKG   Radiology No results found.  Procedures Procedures (including critical care time)  Medications Ordered in UC Medications - No data to display  Initial Impression / Assessment and Plan / UC Course  I have reviewed the triage vital signs and the nursing notes.  Pertinent labs & imaging results that were available during my care of the patient were reviewed by me and considered in my medical decision making (see chart for details).   COVID-19 test was ordered Tessalon Perles prescribed for cough Flonase for congestion Advised patient to quarantine To go to ED for worsening of symptoms Patient verbalized understanding of   the plan of care  Final Clinical Impressions(s) / UC Diagnoses   Final diagnoses:  Exposure to COVID-19 virus     Discharge Instructions     COVID testing ordered.  It will take between 2-7 days for test results.  Someone will contact you regarding abnormal results.    In the meantime: You should remain isolated in your home for 10 days from symptom onset AND greater than 72 hours after symptoms resolution (absence of fever without the use of  fever-reducing medication and improvement in respiratory symptoms), whichever is longer Get plenty of rest and push fluids Tessalon Perles prescribed for cough Flonase prescribed for nasal congestion and runny nose Use medications daily for symptom relief Use OTC medications like ibuprofen or tylenol as needed fever or pain Call or go to the ED if you have any new or worsening symptoms such as fever, worsening cough, shortness of breath, chest tightness, chest pain, turning blue, changes in mental status, etc...     ED Prescriptions    Medication Sig Dispense Auth. Provider   benzonatate (TESSALON) 100 MG capsule Take 1 capsule (100 mg total) by mouth every 8 (eight) hours. 21 capsule Avegno, Komlanvi S, FNP   fluticasone (FLONASE) 50 MCG/ACT nasal spray Place 1 spray into both nostrils daily for 14 days. 16 g Avegno, Komlanvi S, FNP     PDMP not reviewed this encounter.   Avegno, Komlanvi S, FNP 04/28/19 1114  

## 2019-04-28 NOTE — ED Triage Notes (Signed)
Pt presents to UC w/ c/o covid exposure of son and daughter. Pt c/o fatigue and mild cough x2-3 days.

## 2019-04-29 LAB — NOVEL CORONAVIRUS, NAA: SARS-CoV-2, NAA: DETECTED — AB

## 2019-05-01 ENCOUNTER — Telehealth (HOSPITAL_COMMUNITY): Payer: Self-pay | Admitting: Emergency Medicine

## 2019-05-01 NOTE — Telephone Encounter (Signed)

## 2019-05-07 ENCOUNTER — Encounter (HOSPITAL_COMMUNITY): Payer: Self-pay | Admitting: Emergency Medicine

## 2019-05-07 ENCOUNTER — Other Ambulatory Visit: Payer: Self-pay

## 2019-05-07 ENCOUNTER — Emergency Department (HOSPITAL_COMMUNITY)
Admission: EM | Admit: 2019-05-07 | Discharge: 2019-05-07 | Disposition: A | Discharge status: Home / Self Care | Payer: BC Managed Care – PPO | Attending: Emergency Medicine | Admitting: Emergency Medicine

## 2019-05-07 DIAGNOSIS — E119 Type 2 diabetes mellitus without complications: Secondary | ICD-10-CM | POA: Insufficient documentation

## 2019-05-07 DIAGNOSIS — F17210 Nicotine dependence, cigarettes, uncomplicated: Secondary | ICD-10-CM | POA: Insufficient documentation

## 2019-05-07 DIAGNOSIS — R1013 Epigastric pain: Secondary | ICD-10-CM | POA: Diagnosis present

## 2019-05-07 DIAGNOSIS — U071 COVID-19: Secondary | ICD-10-CM | POA: Diagnosis not present

## 2019-05-07 DIAGNOSIS — Z79899 Other long term (current) drug therapy: Secondary | ICD-10-CM | POA: Insufficient documentation

## 2019-05-07 DIAGNOSIS — I10 Essential (primary) hypertension: Secondary | ICD-10-CM | POA: Insufficient documentation

## 2019-05-07 DIAGNOSIS — Z7982 Long term (current) use of aspirin: Secondary | ICD-10-CM | POA: Insufficient documentation

## 2019-05-07 MED ORDER — ONDANSETRON HCL 4 MG PO TABS: 4.0000 mg | ORAL_TABLET | Freq: Three times a day (TID) | ORAL | 0 refills | Status: DC | PRN

## 2019-05-07 MED ORDER — ALUM & MAG HYDROXIDE-SIMETH 200-200-20 MG/5ML PO SUSP
30.0000 mL | Freq: Once | ORAL | Status: AC
  Administered 2019-05-07: 30 mL via ORAL
  Filled 2019-05-07: qty 30

## 2019-05-07 MED ORDER — OMEPRAZOLE 20 MG PO CPDR: 20.0000 mg | DELAYED_RELEASE_CAPSULE | Freq: Every day | ORAL | 0 refills | Status: DC

## 2019-05-07 MED ORDER — LIDOCAINE VISCOUS HCL 2 % MT SOLN
15.0000 mL | Freq: Once | OROMUCOSAL | Status: AC
  Administered 2019-05-07: 13:00:00 15 mL via ORAL
  Filled 2019-05-07: qty 15

## 2019-05-07 NOTE — Discharge Instructions (Signed)
Take prilosec daily before major meal.  Take zofran as needed for nausea.  Stay hydrated.  You may take tylenol or ibuprofen as needed for aches and pain.  Return if you have any concerns.

## 2019-05-07 NOTE — ED Triage Notes (Signed)
Covid + 2 weeks ago.  C/o abd, burning, rating discomfort 10/10, fatigue and nausea.

## 2019-05-07 NOTE — ED Provider Notes (Signed)
[facility] EMERGENCY DEPARTMENT Provider Note   CSN: [id number] Arrival date & time: 05/07/19  1205     History Chief Complaint  Patient presents with  . COVID + 2 weeks ago    Krista Carter is a 64 y.o. female.  The history is provided by the patient and medical records. No language interpreter was used.     64 year old female with hx of recently diagnosed COVID-19 on 04/28/2019 presenting with multiple complaints which includes abdominal discomfort.  Patient states since she was diagnosed with COVID-19 she has been feeling well.  She has decrease in appetite, loss of taste and smell, feeling unmotivated, having burning sensation to her epigastric region that has been sporadic but recurrent in nature, moderate in severity, associate nausea with occasional nonbloody nonbilious vomit and some diarrhea.  She endorsed occasional cough but without any significant shortness of breath.  No skin rash.  Daughter has similar symptoms.  She denies any significant treatment tried.  States she mostly rest at home.  She denies alcohol abuse.  She is a smoker.  Past Medical History:  Diagnosis Date  . Adrenal adenoma 02/2015   left, seen on CT.   [place] Kitchen Anemia 07/2010, 04/2015   normocytic.   Mar[place] Kitchen Depression with anxiety 2008  . Diabetes mellitus 2011   type 2.   . Duodenitis 2005   [place] also seen on EGD.   Marland Kitchen Dyslipidemia 2011  . Expressive aphasia 03/2010   subjective word finding difficulty and slurred speach.  MRI/MRA, CT scan: mild small vessel disease, mild narrowing bilateral int carotids and RCA  . Hydronephrosis 2010   per ultrasound: mild, chronic, non-obstructing, bilateral.  . Hypertension 2011  . IBS (irritable bowel syndrome) 2008   diarrhea prone  . Mitral valve regurgitation 03/2010   Mild MVR, EF 60-65% per echo.  . Multiple gastric ulcers 09/2013   Benign, due to ASA powders.  H Pylori negative.    . Renal insufficiency 2014   AKI 10/2012 due to dehydration, acute (probably  infectious/viral) colitis, ACE use.   . Severe protein-calorie malnutrition ([place]) 10/2012  . Tibia/fibula fracture 04/2015   left.  Dital tibia, proximal fibula.     Patient Active Problem List   Diagnosis Date Noted  . Benign positional vertigo 07/05/2016  . Slurred speech 07/05/2016  . Gait disturbance 07/05/2016  . Acute upper GI bleed 04/14/2015  . Gastric ulcer with hemorrhage 04/14/2015  . Closed left fibular fracture 04/14/2015  . Faintness   . Acute gastric ulcer   . History of peptic ulcer disease   . Emesis   . Hematemesis with nausea   . Syncope 04/11/2015  . Dizziness 11/27/2013  . Chest pain 11/27/2013  . AKI (acute kidney injury) ([place]) 11/27/2013  . Dehydration 11/27/2013  . Hypotension 11/27/2013  . Loss of weight 08/17/2013  . Diarrhea 08/17/2013  . Nausea alone 08/17/2013  . Generalized abdominal pain 08/17/2013  . Protein-calorie malnutrition, severe ([place]) 11/26/2012  . Nausea vomiting and diarrhea 11/25/2012  . Hypokalemia 11/25/2012  . Acute renal failure ([place]) 11/25/2012  . APNEA 02/17/2010  . SNORING 02/17/2010  . OTH SPEC PERS HX PRESENTING HAZARDS HEALTH OTH 02/17/2010  . HYPERLIPIDEMIA 06/10/2007  . SMOKER 06/10/2007  . HYPERTENSION 06/10/2007  . GERD 06/10/2007  . IBS 06/10/2007  . RENAL CALCULUS 06/10/2007  . DUODENITIS 04/06/2003  . HIATAL HERNIA 04/06/2003    Past Surgical History:  Procedure Laterality Date  . ABDOMINAL HYSTERECTOMY    . BONE  MARROW BIOPSY  07/2010.    Benign fibroconnective and soft tissue with abundant acute and chronic inflammation, with foreign body multinucleated giant cell reaction. No atypia, no malignancy  . colonoscopies  2008, 09/2013   both normal.   . ESOPHAGOGASTRODUODENOSCOPY  2005, 09/2013, 10/2013  . ESOPHAGOGASTRODUODENOSCOPY (EGD) WITH PROPOFOL N/A 04/13/2015   Procedure: ESOPHAGOGASTRODUODENOSCOPY (EGD) WITH PROPOFOL;  Surgeon: Jerene Bears, MD;  Location: [facility] ENDOSCOPY;  Service: Endoscopy;  Laterality:  N/A;  . ORIF NONUNION HUMERUS FRACTURE  07/2010   with bone graft.      OB History   No obstetric history on file.     Family History  Problem Relation Age of Onset  . Stroke Mother   . Kidney cancer Mother   . Colon cancer Maternal Uncle     Social History   Tobacco Use  . Smoking status: Current Every Day Smoker    Packs/day: 1.00    Types: Cigarettes  . Smokeless tobacco: Never Used  Substance Use Topics  . Alcohol use: Yes    Comment: "just socially"  . Drug use: No    Home Medications Prior to Admission medications   Medication Sig Start Date End Date Taking? Authorizing Provider  aspirin EC 81 MG tablet Take 81 mg by mouth daily.      [provider]  benzonatate (TESSALON) 100 MG capsule Take 1 capsule (100 mg total) by mouth every 8 (eight) hours. 04/28/19   Avegno, [name], FNP  clonazePAM (KLONOPIN) 0.5 MG tablet Take 0.5 mg by mouth 2 (two) times daily as needed. Anxiety 02/01/15   [provider]  diazepam (VALIUM) 5 MG tablet TAKE 1/2 TO 1 (ONE-HALF TO ONE) TABLET BY MOUTH EVERY 8 HOURS AS NEEDED (VERTIGO) 08/14/16   Sater, [name], MD  doxycycline (VIBRAMYCIN) 100 MG capsule Take 1 capsule (100 mg total) by mouth 2 (two) times daily. 03/21/18   [name], MD  fluticasone (FLONASE) 50 MCG/ACT nasal spray Place 1 spray into both nostrils daily for 14 days. 04/28/19 05/12/19  Avegno, [name], FNP  ibuprofen (ADVIL,MOTRIN) 400 MG tablet Take 1 tablet (400 mg total) by mouth every 6 (six) hours as needed. 03/21/18   [name], MD  lisinopril (PRINIVIL,ZESTRIL) 10 MG tablet Take 10 mg by mouth daily.    [provider]  meclizine (ANTIVERT) 25 MG tablet Take 1 tablet (25 mg total) by mouth 3 (three) times daily as needed for dizziness. 05/18/16   Sherwood Gambler, MD  metoprolol succinate (TOPROL-XL) 100 MG 24 hr tablet Take 100 mg by mouth daily. 02/02/15   [provider]  nicotine (NICODERM CQ) 21 mg/24hr patch Place 1 patch  (21 mg total) onto the skin daily. 04/14/15   Dhungel, [name], MD  oxyCODONE (OXY IR/ROXICODONE) 5 MG immediate release tablet Take 1 tablet (5 mg total) by mouth every 4 (four) hours as needed for moderate pain. 04/14/15   Dhungel, [name], MD  pantoprazole (PROTONIX) 40 MG tablet Take 1 tablet (40 mg total) by mouth 2 (two) times daily. 04/14/15   Dhungel, Flonnie Overman, MD  predniSONE (DELTASONE) 10 MG tablet Take 6 po x 1d, 5 po x 1d, 4 po x 1d, 3 po x 1d, 2 po x 1d, 1 po x1d 07/05/16   Sater, [name], MD  sucralfate (CARAFATE) 1 g tablet Take 1 tablet (1 g total) by mouth 4 (four) times daily -  with meals and at bedtime. 04/14/15   Dhungel, Flonnie Overman, MD  zolpidem (AMBIEN) 10  MG tablet Take 10 mg by mouth at bedtime as needed for sleep. 06/26/16   [provider]    Allergies    Patient has no known allergies.  Review of Systems   Review of Systems  All other systems reviewed and are negative.   Physical Exam Updated Vital Signs BP (!) 163/90 (BP Location: Right Arm)   Pulse 78   Temp 99 F (37.2 C) (Oral)   Resp 16   Ht 5' 4"  (1.626 m)   Wt 63.5 kg   SpO2 100%   BMI 24.03 kg/m   Physical Exam Vitals and nursing note reviewed.  Constitutional:      General: She is not in acute distress.    Appearance: She is well-developed.  HENT:     Head: Atraumatic.  Eyes:     Conjunctiva/sclera: Conjunctivae normal.  Cardiovascular:     Rate and Rhythm: Normal rate and regular rhythm.     Pulses: Normal pulses.     Heart sounds: Normal heart sounds.  Pulmonary:     Effort: Pulmonary effort is normal.     Breath sounds: Normal breath sounds. No wheezing, rhonchi or rales.  Abdominal:     Palpations: Abdomen is soft.     Tenderness: There is abdominal tenderness (Minimal diffuse abdominal tenderness without focal point tenderness.  Negative Murphy sign, no pain at McBurney's point.).  Musculoskeletal:     Cervical back: Neck supple.  Skin:    Findings: No rash.  Neurological:       Mental Status: She is alert and oriented to person, place, and time.  Psychiatric:        Mood and Affect: Mood normal.     ED Results / Procedures / Treatments   Labs (all labs ordered are listed, but only abnormal results are displayed) Labs Reviewed - No data to display  EKG None  Radiology No results found.  Procedures Procedures (including critical care time)  Medications Ordered in ED Medications  alum & mag hydroxide-simeth (MAALOX/MYLANTA) 200-200-20 MG/5ML suspension 30 mL (30 mLs Oral Given 05/07/19 1258)    And  lidocaine (XYLOCAINE) 2 % viscous mouth solution 15 mL (15 mLs Oral Given 05/07/19 1258)    ED Course  I have reviewed the triage vital signs and the nursing notes.  Pertinent labs & imaging results that were available during my care of the patient were reviewed by me and considered in my medical decision making (see chart for details).    MDM Rules/Calculators/A&P                      BP (!) 163/90 (BP Location: Right Arm)   Pulse 78   Temp 99 F (37.2 C) (Oral)   Resp 16   Ht 5' 4"  (1.626 m)   Wt 63.5 kg   SpO2 100%   BMI 24.03 kg/m   Final Clinical Impression(s) / ED Diagnoses Final diagnoses:  COVID-19 virus infection    Rx / DC Orders ED Discharge Orders    None     Patient here with complaints of burning sensation to her epigastric region as well as multiple other symptoms consistent with her current COVID-19 infection.  She does not have significant discomfort on patient of abdomen to suggest acute abdominal pathology.  Lungs are clear on auscultation.  Vital signs stable.  Mild hypertension with blood pressure of 163/90.  No hypoxia.  Will provide symptomatic treatment.  Krista Carter was evaluated in Emergency  Department on 05/07/2019 for the symptoms described in the history of present illness. She was evaluated in the context of the global COVID-19 pandemic, which necessitated consideration that the patient might be at risk for  infection with the SARS-CoV-2 virus that causes COVID-19. Institutional protocols and algorithms that pertain to the evaluation of patients at risk for COVID-19 are in a state of rapid change based on information released by regulatory bodies including the CDC and federal and state organizations. These policies and algorithms were followed during the patient's care in the ED.    Domenic Moras, PA-C 05/07/19 1618    Milton Ferguson, MD 05/11/19 [phone]

## 2021-02-06 DIAGNOSIS — F17210 Nicotine dependence, cigarettes, uncomplicated: Secondary | ICD-10-CM | POA: Insufficient documentation

## 2021-04-26 DIAGNOSIS — R5383 Other fatigue: Secondary | ICD-10-CM | POA: Insufficient documentation

## 2021-07-31 DIAGNOSIS — E559 Vitamin D deficiency, unspecified: Secondary | ICD-10-CM | POA: Insufficient documentation

## 2021-10-07 ENCOUNTER — Encounter (HOSPITAL_COMMUNITY): Payer: Self-pay | Admitting: Emergency Medicine

## 2021-10-07 ENCOUNTER — Emergency Department (HOSPITAL_COMMUNITY): Payer: BC Managed Care – PPO

## 2021-10-07 ENCOUNTER — Inpatient Hospital Stay (HOSPITAL_COMMUNITY)
Admission: EM | Admit: 2021-10-07 | Discharge: 2021-10-10 | DRG: 440 | Disposition: A | Discharge status: Home / Self Care | Payer: BC Managed Care – PPO | Attending: Internal Medicine | Admitting: Internal Medicine

## 2021-10-07 ENCOUNTER — Other Ambulatory Visit: Payer: Self-pay

## 2021-10-07 ENCOUNTER — Observation Stay (HOSPITAL_COMMUNITY): Payer: BC Managed Care – PPO

## 2021-10-07 DIAGNOSIS — Z800 Family history of malignant neoplasm of digestive organs: Secondary | ICD-10-CM

## 2021-10-07 DIAGNOSIS — Z79899 Other long term (current) drug therapy: Secondary | ICD-10-CM

## 2021-10-07 DIAGNOSIS — I480 Paroxysmal atrial fibrillation: Secondary | ICD-10-CM | POA: Diagnosis present

## 2021-10-07 DIAGNOSIS — I10 Essential (primary) hypertension: Secondary | ICD-10-CM | POA: Diagnosis present

## 2021-10-07 DIAGNOSIS — F17210 Nicotine dependence, cigarettes, uncomplicated: Secondary | ICD-10-CM | POA: Diagnosis present

## 2021-10-07 DIAGNOSIS — E785 Hyperlipidemia, unspecified: Secondary | ICD-10-CM | POA: Diagnosis present

## 2021-10-07 DIAGNOSIS — I340 Nonrheumatic mitral (valve) insufficiency: Secondary | ICD-10-CM | POA: Diagnosis present

## 2021-10-07 DIAGNOSIS — K852 Alcohol induced acute pancreatitis without necrosis or infection: Secondary | ICD-10-CM | POA: Diagnosis not present

## 2021-10-07 DIAGNOSIS — E860 Dehydration: Secondary | ICD-10-CM | POA: Diagnosis present

## 2021-10-07 DIAGNOSIS — K580 Irritable bowel syndrome with diarrhea: Secondary | ICD-10-CM | POA: Diagnosis present

## 2021-10-07 DIAGNOSIS — I4891 Unspecified atrial fibrillation: Secondary | ICD-10-CM

## 2021-10-07 DIAGNOSIS — K589 Irritable bowel syndrome without diarrhea: Secondary | ICD-10-CM | POA: Diagnosis present

## 2021-10-07 DIAGNOSIS — K859 Acute pancreatitis without necrosis or infection, unspecified: Principal | ICD-10-CM

## 2021-10-07 DIAGNOSIS — Z8051 Family history of malignant neoplasm of kidney: Secondary | ICD-10-CM

## 2021-10-07 DIAGNOSIS — Z823 Family history of stroke: Secondary | ICD-10-CM

## 2021-10-07 DIAGNOSIS — Z7982 Long term (current) use of aspirin: Secondary | ICD-10-CM

## 2021-10-07 DIAGNOSIS — E119 Type 2 diabetes mellitus without complications: Secondary | ICD-10-CM | POA: Diagnosis present

## 2021-10-07 LAB — URINALYSIS, ROUTINE W REFLEX MICROSCOPIC
Bacteria, UA: NONE SEEN
Bilirubin Urine: NEGATIVE
Glucose, UA: NEGATIVE mg/dL
Ketones, ur: 5 mg/dL — AB
Leukocytes,Ua: NEGATIVE
Nitrite: NEGATIVE
Protein, ur: NEGATIVE mg/dL
Specific Gravity, Urine: 1.042 — ABNORMAL HIGH (ref 1.005–1.030)
pH: 5 (ref 5.0–8.0)

## 2021-10-07 LAB — COMPREHENSIVE METABOLIC PANEL
ALT: 11 U/L (ref 0–44)
AST: 15 U/L (ref 15–41)
Albumin: 3.4 g/dL — ABNORMAL LOW (ref 3.5–5.0)
Alkaline Phosphatase: 102 U/L (ref 38–126)
Anion gap: 14 (ref 5–15)
BUN: 11 mg/dL (ref 8–23)
CO2: 24 mmol/L (ref 22–32)
Calcium: 9.6 mg/dL (ref 8.9–10.3)
Chloride: 101 mmol/L (ref 98–111)
Creatinine, Ser: 0.86 mg/dL (ref 0.44–1.00)
GFR, Estimated: >60 mL/min (ref >60)
Glucose, Bld: 103 mg/dL — ABNORMAL HIGH (ref 70–99)
Potassium: 3.5 mmol/L (ref 3.5–5.1)
Sodium: 139 mmol/L (ref 135–145)
Total Bilirubin: 0.5 mg/dL (ref 0.3–1.2)
Total Protein: 6.7 g/dL (ref 6.5–8.1)

## 2021-10-07 LAB — CBC
HCT: 42 % (ref 36.0–46.0)
Hemoglobin: 14.5 g/dL (ref 12.0–15.0)
MCH: 33.2 pg (ref 26.0–34.0)
MCHC: 34.5 g/dL (ref 30.0–36.0)
MCV: 96.1 fL (ref 80.0–100.0)
Platelets: 293 10*3/uL (ref 150–400)
RBC: 4.37 MIL/uL (ref 3.87–5.11)
RDW: 16.4 % — ABNORMAL HIGH (ref 11.5–15.5)
WBC: 11.1 10*3/uL — ABNORMAL HIGH (ref 4.0–10.5)
nRBC: 0 % (ref 0.0–0.2)

## 2021-10-07 LAB — MAGNESIUM: Magnesium: 1.8 mg/dL (ref 1.7–2.4)

## 2021-10-07 LAB — LACTIC ACID, PLASMA: Lactic Acid, Venous: 1.9 mmol/L (ref 0.5–1.9)

## 2021-10-07 LAB — LIPASE, BLOOD: Lipase: 214 U/L — ABNORMAL HIGH (ref 11–51)

## 2021-10-07 LAB — TSH: TSH: 0.928 u[IU]/mL (ref 0.350–4.500)

## 2021-10-07 MED ORDER — IOHEXOL 300 MG/ML  SOLN
100.0000 mL | Freq: Once | INTRAMUSCULAR | Status: AC | PRN
  Administered 2021-10-07: 100 mL via INTRAVENOUS

## 2021-10-07 MED ORDER — MORPHINE SULFATE (PF) 2 MG/ML IV SOLN
2.0000 mg | Freq: Once | INTRAVENOUS | Status: AC
  Administered 2021-10-07: 2 mg via INTRAVENOUS
  Filled 2021-10-07: qty 1

## 2021-10-07 MED ORDER — HEPARIN (PORCINE) 25000 UT/250ML-% IV SOLN: 900.0000 [IU]/h | INTRAVENOUS | Status: DC

## 2021-10-07 MED ORDER — ADULT MULTIVITAMIN W/MINERALS CH
1.0000 | ORAL_TABLET | Freq: Every day | ORAL | Status: DC
Start: 2021-10-07 — End: 2021-10-10
  Administered 2021-10-07 – 2021-10-10 (×4): 1 via ORAL
  Filled 2021-10-07 (×4): qty 1

## 2021-10-07 MED ORDER — ASPIRIN 325 MG PO TABS
325.0000 mg | ORAL_TABLET | Freq: Every day | ORAL | Status: DC
  Administered 2021-10-07 – 2021-10-10 (×4): 325 mg via ORAL
  Filled 2021-10-07 (×4): qty 1

## 2021-10-07 MED ORDER — LORAZEPAM 2 MG/ML IJ SOLN: 1.0000 mg | INTRAMUSCULAR | Status: AC | PRN

## 2021-10-07 MED ORDER — ENOXAPARIN SODIUM 40 MG/0.4ML IJ SOSY
40.0000 mg | PREFILLED_SYRINGE | INTRAMUSCULAR | Status: DC
  Administered 2021-10-07 – 2021-10-09 (×3): 40 mg via SUBCUTANEOUS
  Filled 2021-10-07 (×4): qty 0.4

## 2021-10-07 MED ORDER — LACTATED RINGERS IV BOLUS
2000.0000 mL | Freq: Once | INTRAVENOUS | Status: AC
  Administered 2021-10-07: 2000 mL via INTRAVENOUS

## 2021-10-07 MED ORDER — ONDANSETRON HCL 4 MG PO TABS: 4.0000 mg | ORAL_TABLET | Freq: Four times a day (QID) | ORAL | Status: DC | PRN

## 2021-10-07 MED ORDER — ACETAMINOPHEN 650 MG RE SUPP: 650.0000 mg | Freq: Four times a day (QID) | RECTAL | Status: DC | PRN

## 2021-10-07 MED ORDER — MORPHINE SULFATE (PF) 4 MG/ML IV SOLN
4.0000 mg | Freq: Once | INTRAVENOUS | Status: AC
  Administered 2021-10-07: 4 mg via INTRAVENOUS
  Filled 2021-10-07: qty 1

## 2021-10-07 MED ORDER — POTASSIUM CHLORIDE IN NACL 20-0.9 MEQ/L-% IV SOLN
INTRAVENOUS | Status: DC
  Filled 2021-10-07 (×8): qty 1000

## 2021-10-07 MED ORDER — ACETAMINOPHEN 325 MG PO TABS
650.0000 mg | ORAL_TABLET | Freq: Four times a day (QID) | ORAL | Status: DC | PRN
  Administered 2021-10-08: 650 mg via ORAL
  Filled 2021-10-07: qty 2

## 2021-10-07 MED ORDER — FOLIC ACID 1 MG PO TABS
1.0000 mg | ORAL_TABLET | Freq: Every day | ORAL | Status: DC
  Administered 2021-10-07 – 2021-10-10 (×4): 1 mg via ORAL
  Filled 2021-10-07 (×4): qty 1

## 2021-10-07 MED ORDER — NICOTINE 21 MG/24HR TD PT24
21.0000 mg | MEDICATED_PATCH | Freq: Every day | TRANSDERMAL | Status: DC
  Filled 2021-10-07 (×2): qty 1

## 2021-10-07 MED ORDER — LORAZEPAM 1 MG PO TABS
1.0000 mg | ORAL_TABLET | ORAL | Status: AC | PRN
  Administered 2021-10-07: 1 mg via ORAL
  Filled 2021-10-07: qty 1

## 2021-10-07 MED ORDER — THIAMINE HCL 100 MG/ML IJ SOLN
100.0000 mg | Freq: Every day | INTRAMUSCULAR | Status: DC
Start: 2021-10-07 — End: 2021-10-09
  Administered 2021-10-07 – 2021-10-09 (×3): 100 mg via INTRAVENOUS
  Filled 2021-10-07 (×3): qty 2

## 2021-10-07 MED ORDER — PNEUMOCOCCAL 20-VAL CONJ VACC 0.5 ML IM SUSY
0.5000 mL | PREFILLED_SYRINGE | INTRAMUSCULAR | Status: DC
  Filled 2021-10-07: qty 0.5

## 2021-10-07 MED ORDER — MECLIZINE HCL 25 MG PO TABS
25.0000 mg | ORAL_TABLET | Freq: Three times a day (TID) | ORAL | Status: DC | PRN
Start: 2021-10-07 — End: 2021-10-10

## 2021-10-07 MED ORDER — ONDANSETRON HCL 4 MG/2ML IJ SOLN
4.0000 mg | Freq: Four times a day (QID) | INTRAMUSCULAR | Status: DC | PRN
  Administered 2021-10-08: 4 mg via INTRAVENOUS
  Filled 2021-10-07: qty 2

## 2021-10-07 MED ORDER — FOLIC ACID 5 MG/ML IJ SOLN
1.0000 mg | Freq: Once | INTRAMUSCULAR | Status: DC
  Filled 2021-10-07: qty 0.2

## 2021-10-07 MED ORDER — ALBUTEROL SULFATE (2.5 MG/3ML) 0.083% IN NEBU: 2.5000 mg | INHALATION_SOLUTION | RESPIRATORY_TRACT | Status: DC | PRN

## 2021-10-07 MED ORDER — ONDANSETRON HCL 4 MG/2ML IJ SOLN
4.0000 mg | Freq: Once | INTRAMUSCULAR | Status: AC
  Administered 2021-10-07: 4 mg via INTRAVENOUS
  Filled 2021-10-07: qty 2

## 2021-10-07 MED ORDER — METOPROLOL TARTRATE 25 MG PO TABS
25.0000 mg | ORAL_TABLET | Freq: Two times a day (BID) | ORAL | Status: DC
Start: 2021-10-07 — End: 2021-10-10
  Administered 2021-10-07 – 2021-10-10 (×6): 25 mg via ORAL
  Filled 2021-10-07 (×7): qty 1

## 2021-10-07 MED ORDER — HEPARIN BOLUS VIA INFUSION
3000.0000 [IU] | Freq: Once | INTRAVENOUS | Status: DC
  Filled 2021-10-07: qty 3000

## 2021-10-07 MED ORDER — OXYCODONE HCL 5 MG PO TABS
5.0000 mg | ORAL_TABLET | ORAL | Status: DC | PRN
  Administered 2021-10-07 – 2021-10-10 (×11): 5 mg via ORAL
  Filled 2021-10-07 (×11): qty 1

## 2021-10-07 MED ORDER — METOPROLOL SUCCINATE ER 25 MG PO TB24
100.0000 mg | ORAL_TABLET | Freq: Every day | ORAL | Status: DC
Start: 2021-10-07 — End: 2021-10-07

## 2021-10-07 MED ORDER — PANTOPRAZOLE SODIUM 40 MG PO TBEC
40.0000 mg | DELAYED_RELEASE_TABLET | Freq: Two times a day (BID) | ORAL | Status: DC
  Administered 2021-10-07 – 2021-10-10 (×6): 40 mg via ORAL
  Filled 2021-10-07 (×6): qty 1

## 2021-10-07 MED ORDER — FOLIC ACID 5 MG/ML IJ SOLN: 1.0000 mg | Freq: Once | INTRAMUSCULAR | Status: DC

## 2021-10-07 MED ORDER — MORPHINE SULFATE (PF) 2 MG/ML IV SOLN
2.0000 mg | INTRAVENOUS | Status: DC | PRN
  Administered 2021-10-07 – 2021-10-09 (×7): 2 mg via INTRAVENOUS
  Filled 2021-10-07 (×7): qty 1

## 2021-10-07 NOTE — ED Notes (Signed)
Pt had near syncopal episode after standing to go to the bathroom.

## 2021-10-07 NOTE — Discharge Instructions (Signed)
                  Intensive Outpatient Programs  [facility]    [facility] [place] [address]     [place] [address] [place],  [place], [place]      [place]  (Inpatient and outpatient)  [phone] (Suboxone and Methadone) [address]           [phone]           [facility]    [facility] - Intensive Outpatient [address]     [address] [place] [address] [place],  [address]     [place], [place]      [phone]  [facility] (Outpatient, Inpatient, [facility]  [facility] (Groups and [facility]idental) (insurance only) [phone]    [place], [place]       [facility]    [facility] (for caregivers and family) [address]     [address] [place], [place], [place]      [phone]  Residential Treatment Programs  [place]  [place](2 years) Residential: 91 days)  [facility] ([place], [place], [place] [phone]      [phone] or [phone]  [facility]    [facility] [address]      [place], [place], [place]      [phone]  [place]   [facility] ([facility]) [place]     [address] [place],  [address]     [place], [place]      [phone] Admissions: 8am-3pm M-F  [facility]: Residential Program [phone] Days)              [facility]: [facility]  [place], [place], [place] or [phone]    (Walk in Hours over the weekend or by referral)   [place] [facility]: [facility]:[phone] (for crisis  response 24 hours a d

## 2021-10-07 NOTE — H&P (Signed)
[facility] History and Physical  Krista Carter ZOX:[id number] DOB: [birth year] DOA: 10/07/2021   PCP: [facility]  Specialists: None  Chief Complaint: Abdominal pain, nausea and vomiting  HPI: Krista Carter is a 66 y.o. female with a past medical history of irritable bowel syndrome, essential hypertension, tobacco abuse who consumes large amounts of alcohol over the past week or so.  She was averaging more than 5 drinks per day.  Subsequently developed nausea vomiting diarrhea and abdominal pain.  Symptoms worse in the last 24 hours and so she decided to come into the hospital overnight.  Abdominal pain currently is 8 out of 10 in intensity.  It was much severe last night.  She also has had multiple episodes of vomiting.  No blood in the emesis.  Also has had loose stools.  Abdominal pain is located in the upper abdomen with radiation to the back.  No precipitating aggravating or relieving factors identified.  Denies any fever or chills.  When she arrived in the emergency department she was noted to be tachycardic.  EKG showed atrial fibrillation with RVR.  Patient denies any history of atrial fibrillation previously.  Denies any chest pain currently.  Does mention history of palpitations on and off over the past 2 months.  Patient spontaneously converted to sinus rhythm within a few minutes of arrival to the emergency department.  She was noted to have elevated lipase level.  CT scan of the abdomen pelvis revealed acute pancreatitis.  Home Medications: This medication list is not reconciled yet. Prior to Admission medications   Medication Sig Start Date End Date Taking? Authorizing Provider  aspirin EC 81 MG tablet Take 81 mg by mouth daily.      [provider]  benzonatate (TESSALON) 100 MG capsule Take 1 capsule (100 mg total) by mouth every 8 (eight) hours. 04/28/19   Avegno, [name], FNP  clonazePAM (KLONOPIN) 0.5 MG tablet Take 0.5 mg by  mouth 2 (two) times daily as needed. Anxiety 02/01/15   [provider]  diazepam (VALIUM) 5 MG tablet TAKE 1/2 TO 1 (ONE-HALF TO ONE) TABLET BY MOUTH EVERY 8 HOURS AS NEEDED (VERTIGO) 08/14/16   Sater, [name], MD  doxycycline (VIBRAMYCIN) 100 MG capsule Take 1 capsule (100 mg total) by mouth 2 (two) times daily. 03/21/18   [name], MD  fluticasone (FLONASE) 50 MCG/ACT nasal spray Place 1 spray into both nostrils daily for 14 days. 04/28/19 05/12/19  Avegno, [name], FNP  ibuprofen (ADVIL,MOTRIN) 400 MG tablet Take 1 tablet (400 mg total) by mouth every 6 (six) hours as needed. 03/21/18   [name], MD  lisinopril (PRINIVIL,ZESTRIL) 10 MG tablet Take 10 mg by mouth daily.    [provider]  meclizine (ANTIVERT) 25 MG tablet Take 1 tablet (25 mg total) by mouth 3 (three) times daily as needed for dizziness. 05/18/16   Sherwood Gambler, MD  metoprolol succinate (TOPROL-XL) 100 MG 24 hr tablet Take 100 mg by mouth daily. 02/02/15   [provider]  nicotine (NICODERM CQ) 21 mg/24hr patch Place 1 patch (21 mg total) onto the skin daily. 04/14/15   Dhungel, Flonnie Overman, MD  omeprazole (PRILOSEC) 20 MG capsule Take 1 capsule (20 mg total) by mouth daily. 05/07/19   Domenic Moras, PA-C  ondansetron (ZOFRAN) 4 MG tablet Take 1 tablet (4 mg total) by mouth every 8 (eight) hours as needed for nausea or vomiting. 05/07/19   Domenic Moras, PA-C  oxyCODONE (  OXY IR/ROXICODONE) 5 MG immediate release tablet Take 1 tablet (5 mg total) by mouth every 4 (four) hours as needed for moderate pain. 04/14/15   Dhungel, [name], MD  pantoprazole (PROTONIX) 40 MG tablet Take 1 tablet (40 mg total) by mouth 2 (two) times daily. 04/14/15   Dhungel, Flonnie Overman, MD  predniSONE (DELTASONE) 10 MG tablet Take 6 po x 1d, 5 po x 1d, 4 po x 1d, 3 po x 1d, 2 po x 1d, 1 po x1d 07/05/16   Sater, [name], MD  sucralfate (CARAFATE) 1 g tablet Take 1 tablet (1 g total) by mouth 4 (four) times daily -  with meals and at bedtime.  04/14/15   Dhungel, [name], MD  zolpidem (AMBIEN) 10 MG tablet Take 10 mg by mouth at bedtime as needed for sleep. 06/26/16   [provider]    Allergies: No Known Allergies  Past Medical History: Past Medical History:  Diagnosis Date   Adrenal adenoma 02/2015   left, seen on CT.    Anemia 07/2010, 04/2015   normocytic.    Depression with anxiety 2008   Diabetes mellitus 2011   type 2.    Duodenitis 2005   HH also seen on EGD.    Dyslipidemia 2011   Expressive aphasia 03/2010   subjective word finding difficulty and slurred speach.  MRI/MRA, CT scan: mild small vessel disease, mild narrowing bilateral int carotids and RCA   Hydronephrosis 2010   per ultrasound: mild, chronic, non-obstructing, bilateral.   Hypertension 2011   IBS (irritable bowel syndrome) 2008   diarrhea prone   Mitral valve regurgitation 03/2010   Mild MVR, EF 60-65% per echo.   Multiple gastric ulcers 09/2013   Benign, due to ASA powders.  H Pylori negative.     Renal insufficiency 2014   AKI 10/2012 due to dehydration, acute (probably infectious/viral) colitis, ACE use.    Severe protein-calorie malnutrition ([place]) 10/2012   Tibia/fibula fracture 04/2015   left.  Dital tibia, proximal fibula.     Past Surgical History:  Procedure Laterality Date   ABDOMINAL HYSTERECTOMY     BONE MARROW BIOPSY  07/2010.    Benign fibroconnective and soft tissue with abundant acute and chronic inflammation, with foreign body multinucleated giant cell reaction. No atypia, no malignancy   colonoscopies  2008, 09/2013   both normal.    ESOPHAGOGASTRODUODENOSCOPY  2005, 09/2013, 10/2013   ESOPHAGOGASTRODUODENOSCOPY (EGD) WITH PROPOFOL N/A 04/13/2015   Procedure: ESOPHAGOGASTRODUODENOSCOPY (EGD) WITH PROPOFOL;  Surgeon: Jerene Bears, MD;  Location: [facility];  Service: Endoscopy;  Laterality: N/A;   ORIF NONUNION HUMERUS FRACTURE  07/2010   with bone graft.     Social History: Stays with her sister.  Smokes 1 and half  packs of cigarettes on a daily basis.  Alcohol consumption addressed in HPI.  Independent with daily activities.  Family History:  Family History  Problem Relation Age of Onset   Stroke Mother    Kidney cancer Mother    Colon cancer Maternal Uncle      Review of Systems - History obtained from the patient General ROS: positive for  - fatigue and malaise Psychological ROS: negative Ophthalmic ROS: negative ENT ROS: negative Allergy and Immunology ROS: negative Hematological and Lymphatic ROS: negative Endocrine ROS: negative Respiratory ROS: no cough, shortness of breath, or wheezing Cardiovascular ROS: no chest pain or dyspnea on exertion Gastrointestinal ROS: As in HPI Genito-Urinary ROS: no dysuria, trouble voiding, or hematuria Musculoskeletal ROS: negative Neurological ROS: no  TIA or stroke symptoms Dermatological ROS: negative  Physical Examination  Vitals:   10/07/21 0750 10/07/21 0800 10/07/21 0845 10/07/21 0930  BP: 113/76 100/74 (!) 143/87 138/89  Pulse: 92 90 82 78  Resp: _0 Temp:      SpO2: 100% 97% 100% 98%  Weight:      Height:        BP 138/89   Pulse 78   Temp 98.5 F (36.9 C)   Resp 14   Ht 5' 4.5" (1.638 m)   Wt 63.4 kg   SpO2 98%   BMI 23.62 kg/m   General appearance: alert, cooperative, appears stated age, and no distress Head: Normocephalic, without obvious abnormality, atraumatic Eyes: conjunctivae/corneas clear. PERRL, EOM's intact.  Throat: lips, mucosa, and tongue normal; teeth and gums normal Neck: no adenopathy, no carotid bruit, no JVD, supple, symmetrical, trachea midline, and thyroid not enlarged, symmetric, no tenderness/mass/nodules Back: symmetric, no curvature. ROM normal. No CVA tenderness. Resp: clear to auscultation bilaterally Cardio: regular rate and rhythm, S1, S2 normal, no murmur, click, rub or gallop GI: Abdomen is soft.  Tender in the epigastric area without any rebound rigidity or guarding.  No masses  organomegaly Extremities: extremities normal, atraumatic, no cyanosis or edema Pulses: 2+ and symmetric Skin: Skin color, texture, turgor normal. No rashes or lesions Lymph nodes: Cervical, supraclavicular, and axillary nodes normal. Neurologic: No focal neurological deficits.  Alert and oriented x3.   Labs on Admission: I have personally reviewed following labs and imaging studies  CBC: Recent Labs  Lab 10/07/21 0549  WBC 11.1*  HGB 14.5  HCT 42.0  MCV 96.1  PLT 681   Basic Metabolic Panel: Recent Labs  Lab 10/07/21 0549  NA 139  K 3.5  CL 101  CO2 24  GLUCOSE 103*  BUN 11  CREATININE 0.86  CALCIUM 9.6   GFR: Estimated Creatinine Clearance: 58.3 mL/min (by C-G formula based on SCr of 0.86 mg/dL). Liver Function Tests: Recent Labs  Lab 10/07/21 0549  AST 15  ALT 11  ALKPHOS 102  BILITOT 0.5  PROT 6.7  ALBUMIN 3.4*   Recent Labs  Lab 10/07/21 0549  LIPASE 214*     Radiological Exams on Admission: CT ABDOMEN PELVIS W CONTRAST  Result Date: 10/07/2021 CLINICAL DATA:  Acute, nonlocalized abdominal pain. Nausea and vomiting for 3 days EXAM: CT ABDOMEN AND PELVIS WITH CONTRAST TECHNIQUE: Multidetector CT imaging of the abdomen and pelvis was performed using the standard protocol following bolus administration of intravenous contrast. RADIATION DOSE REDUCTION: This exam was performed according to the departmental dose-optimization program which includes automated exposure control, adjustment of the mA and/or kV according to patient size and/or use of iterative reconstruction technique. CONTRAST:  196m OMNIPAQUE IOHEXOL 300 MG/ML  SOLN COMPARISON:  02/28/2015 FINDINGS: Lower chest:  Atelectasis at the lung bases. Hepatobiliary: No focal liver abnormality.No evidence of biliary obstruction or stone. Pancreas: Peripancreatic fat stranding. Coarse calcifications in the uncinate process an accessory duct of the pancreas suggesting chronic inflammation. Upper normal main  duct at the head measuring 3 mm. No organized collection. Spleen: Unremarkable. Adrenals/Urinary Tract: Negative adrenals. Chronic bilateral pelviectasis. Simple renal cortical cysts. Unremarkable bladder. Stomach/Bowel:  No obstruction. No visible bowel inflammation. Vascular/Lymphatic: No acute vascular abnormality. Scattered atheromatous calcifications of the aorta and iliacs. No mass or adenopathy. Reproductive:Hysterectomy. Other: Trace reactive fluid in the pelvis. Musculoskeletal: No acute abnormalities. Advanced lumbar spine degeneration with mild scoliosis. IMPRESSION: Acute edematous pancreatitis. Calcifications at the uncinate  process and accessory duct suggesting chronic inflammation as well. Electronically Signed   By: Jorje Guild M.D.   On: 10/07/2021 07:59    My interpretation of Electrocardiogram: Initial EKG shows atrial fibrillation with RVR. Test lives with her sister rate was in the 150s.  Normal axis.  Subsequent EKG shows normal sinus rhythm. Rate in the 90s.  Normal axis.  No concerning ST or T wave changes noted.   Problem List  Principal Problem:   Alcohol induced acute pancreatitis Active Problems:   Essential hypertension   IBS   PAF (paroxysmal atrial fibrillation) (HCC)   Acute alcoholic pancreatitis   Assessment: This is a 66 year old Caucasian female with past medical history as stated earlier who comes in with abdominal pain nausea vomiting.  Found to have acute pancreatitis.  This is most likely due to heavy alcohol consumption in the past few days.  Denies any consistent history of significant alcohol use.  When she presented to the ED she was noted to be in atrial fibrillation with RVR but spontaneously converted to sinus rhythm.  Plan:  #1. Acute alcoholic pancreatitis: Lipase level elevated at 214.  She was counseled regarding alcohol use.  She will be placed on clear liquid diet.  IV fluids.  Recheck labs tomorrow.  Pain control.  Check triglyceride  levels.  No cholelithiasis identified on CT scan.  #2. Transient atrial fibrillation with RVR: Likely triggered by dehydration and acute illness.  Lasted only for a few minutes.  Will check TSH.  We will order echocardiogram.  She does mention history of palpitations over the past 2 months.  Monitor on telemetry for now.  Initiate aspirin.  CHADS2 vascular score as of now is 2.  However her episode of atrial fibrillation was very brief.  We will determine need for anticoagulation depending on echocardiogram and further discussions with patient.  This will also depend on her history of heavy alcohol consumption recently which will increase the risk of bleeding.  She may benefit from a 30-day event monitor to further characterize her palpitations.  #3. Alcohol abuse: Denies longstanding history of alcohol abuse.  She mentions that she just had heavy consumption in the past week or so.  We will place her on CIWA protocol for now.  She was counseled.  Thiamine multivitamins.  #4. Essential hypertension: Monitor blood pressures closely.  Continue metoprolol.  ACE inhibitor on hold.  Home medications to be reconciled.  DVT Prophylaxis: Lovenox Code Status: Full code Family Communication: Discussed with patient Disposition: Hopefully return home in improved Consults called: None Admission Status: Status is: Observation The patient remains OBS appropriate and will d/c before 2 midnights.    Severity of Illness: The appropriate patient status for this patient is OBSERVATION. Observation status is judged to be reasonable and necessary in order to provide the required intensity of service to ensure the patient's safety. The patient's presenting symptoms, physical exam findings, and initial radiographic and laboratory data in the context of their medical condition is felt to place them at decreased risk for further clinical deterioration. Furthermore, it is anticipated that the patient will be medically  stable for discharge from the hospital within 2 midnights of admission.    Further management decisions will depend on results of further testing and patient's response to treatment.   Jullian Clayson [employer]  [facility] operational officer on [email or web address]  10/07/2021, 9:54 AM

## 2021-10-07 NOTE — ED Notes (Signed)
ED TO INPATIENT HANDOFF REPORT  ED Nurse Name and Phone #: Sylvan Lahm RN [phone]  S Name/Age/Gender Krista Carter 66 y.o. female Room/Bed: 002C/002C  Code Status   Code Status: Full Code  Home/SNF/Other Home Patient oriented to: self, place, time, and situation Is this baseline? Yes   Triage Complete: Triage complete  Chief Complaint Acute alcoholic pancreatitis [L79.89]  Triage Note Pt c/o abdominal pain, nausea/vomiting x 3 days. Denies chest pain or urinary symptoms.    Allergies No Known Allergies  Level of Care/Admitting Diagnosis ED Disposition     ED Disposition  Admit   Condition  --   Comment  Hospital Area: [facility] [[id number]]  Level of Care: Telemetry Cardiac [[id number]]  May place patient in observation at [facility] or [place] if equivalent level of care is available:: No  Covid Evaluation: Asymptomatic - no recent exposure (last 10 days) testing not required  Diagnosis: Acute alcoholic pancreatitis [211941]  Admitting Physician: Bonnielee Haff [[id number]]  Attending Physician: Bonnielee Haff [[id number]]          B Medical/Surgery History Past Medical History:  Diagnosis Date   Adrenal adenoma 02/2015   left, seen on CT.    Anemia 07/2010, 04/2015   normocytic.    Depression with anxiety 2008   Diabetes mellitus 2011   type 2.    Duodenitis 2005   HH also seen on EGD.    Dyslipidemia 2011   Expressive aphasia 03/2010   subjective word finding difficulty and slurred speach.  MRI/MRA, CT scan: mild small vessel disease, mild narrowing bilateral int carotids and RCA   Hydronephrosis 2010   per ultrasound: mild, chronic, non-obstructing, bilateral.   Hypertension 2011   IBS (irritable bowel syndrome) 2008   diarrhea prone   Mitral valve regurgitation 03/2010   Mild MVR, EF 60-65% per echo.   Multiple gastric ulcers 09/2013   Benign, due to ASA powders.  H Pylori negative.     Renal insufficiency 2014   AKI 10/2012 due to  dehydration, acute (probably infectious/viral) colitis, ACE use.    Severe protein-calorie malnutrition ([place]) 10/2012   Tibia/fibula fracture 04/2015   left.  Dital tibia, proximal fibula.    Past Surgical History:  Procedure Laterality Date   ABDOMINAL HYSTERECTOMY     BONE MARROW BIOPSY  07/2010.    Benign fibroconnective and soft tissue with abundant acute and chronic inflammation, with foreign body multinucleated giant cell reaction. No atypia, no malignancy   colonoscopies  2008, 09/2013   both normal.    ESOPHAGOGASTRODUODENOSCOPY  2005, 09/2013, 10/2013   ESOPHAGOGASTRODUODENOSCOPY (EGD) WITH PROPOFOL N/A 04/13/2015   Procedure: ESOPHAGOGASTRODUODENOSCOPY (EGD) WITH PROPOFOL;  Surgeon: Jerene Bears, MD;  Location: [facility];  Service: Endoscopy;  Laterality: N/A;   ORIF NONUNION HUMERUS FRACTURE  07/2010   with bone graft.      A IV Location/Drains/Wounds Patient Lines/Drains/Airways Status     Active Line/Drains/Airways     Name Placement date Placement time Site Days   Peripheral IV 10/07/21 20 G Right Antecubital 10/07/21  0629  Antecubital  less than 1   External Urinary Catheter 10/07/21  0747  --  less than 1            Intake/Output Last 24 hours  Intake/Output Summary (Last 24 hours) at 10/07/2021 1218 Last data filed at 10/07/2021 1031 Gross per 24 hour  Intake 2000 ml  Output --  Net 2000 ml    Labs/Imaging Results for orders placed  or performed during the hospital encounter of 10/07/21 (from the past 48 hour(s))  Lipase, blood     Status: Abnormal   Collection Time: 10/07/21  5:49 AM  Result Value Ref Range   Lipase 214 (H) 11 - 51 U/L    Comment: Performed at [facility], [address], [place], [place] [address]  Comprehensive metabolic panel     Status: Abnormal   Collection Time: 10/07/21  5:49 AM  Result Value Ref Range   Sodium 139 135 - 145 mmol/L   Potassium 3.5 3.5 - 5.1 mmol/L   Chloride 101 98 - 111 mmol/L   CO2 24 22 - 32 mmol/L    Glucose, Bld 103 (H) 70 - 99 mg/dL    Comment: Glucose reference range applies only to samples taken after fasting for at least 8 hours.   BUN 11 8 - 23 mg/dL   Creatinine, Ser 0.86 0.44 - 1.00 mg/dL   Calcium 9.6 8.9 - 10.3 mg/dL   Total Protein 6.7 6.5 - 8.1 g/dL   Albumin 3.4 (L) 3.5 - 5.0 g/dL   AST 15 15 - 41 U/L   ALT 11 0 - 44 U/L   Alkaline Phosphatase 102 38 - 126 U/L   Total Bilirubin 0.5 0.3 - 1.2 mg/dL   GFR, Estimated >60 >60 mL/min    Comment: (NOTE) Calculated using the CKD-EPI Creatinine Equation (2021)    Anion gap 14 5 - 15    Comment: Performed at [facility] [address], [place], [place] [address]  CBC     Status: Abnormal   Collection Time: 10/07/21  5:49 AM  Result Value Ref Range   WBC 11.1 (H) 4.0 - 10.5 K/uL   RBC 4.37 3.87 - 5.11 MIL/uL   Hemoglobin 14.5 12.0 - 15.0 g/dL   HCT 42.0 36.0 - 46.0 %   MCV 96.1 80.0 - 100.0 fL   MCH 33.2 26.0 - 34.0 pg   MCHC 34.5 30.0 - 36.0 g/dL   RDW 16.4 (H) 11.5 - 15.5 %   Platelets 293 150 - 400 K/uL   nRBC 0.0 0.0 - 0.2 %    Comment: Performed at [facility], [place] [address] [place], [place] [address]  Lactic acid, plasma     Status: None   Collection Time: 10/07/21  7:03 AM  Result Value Ref Range   Lactic Acid, Venous 1.9 0.5 - 1.9 mmol/L    Comment: Performed at [facility] [address] [place], [place] [address]  Urinalysis, Routine w reflex microscopic Urine, Clean Catch     Status: Abnormal   Collection Time: 10/07/21 10:47 AM  Result Value Ref Range   Color, Urine YELLOW YELLOW   APPearance CLEAR CLEAR   Specific Gravity, Urine 1.042 (H) 1.005 - 1.030   pH 5.0 5.0 - 8.0   Glucose, UA NEGATIVE NEGATIVE mg/dL   Hgb urine dipstick SMALL (A) NEGATIVE   Bilirubin Urine NEGATIVE NEGATIVE   Ketones, ur 5 (A) NEGATIVE mg/dL   Protein, ur NEGATIVE NEGATIVE mg/dL   Nitrite NEGATIVE NEGATIVE   Leukocytes,Ua NEGATIVE NEGATIVE   RBC / HPF 0-5 0 - 5 RBC/hpf   WBC, UA 0-5 0 - 5  WBC/hpf   Bacteria, UA NONE SEEN NONE SEEN   Squamous Epithelial / LPF 0-5 0 - 5   Mucus PRESENT     Comment: Performed at [facility], [place] [address], [place], [place] [address]  CT ABDOMEN PELVIS W CONTRAST  Result Date: 10/07/2021 CLINICAL DATA:  Acute, nonlocalized abdominal pain. Nausea and vomiting for 3 days EXAM: CT ABDOMEN AND PELVIS WITH CONTRAST TECHNIQUE: Multidetector CT imaging of the abdomen and pelvis was performed using the standard protocol following bolus administration of intravenous contrast. RADIATION DOSE REDUCTION: This exam was performed according to the departmental dose-optimization program which includes automated exposure control, adjustment of the mA and/or kV according to patient size and/or use of iterative reconstruction technique. CONTRAST:  131m OMNIPAQUE IOHEXOL 300 MG/ML  SOLN COMPARISON:  02/28/2015 FINDINGS: Lower chest:  Atelectasis at the lung bases. Hepatobiliary: No focal liver abnormality.No evidence of biliary obstruction or stone. Pancreas: Peripancreatic fat stranding. Coarse calcifications in the uncinate process an accessory duct of the pancreas suggesting chronic inflammation. Upper normal main duct at the head measuring 3 mm. No organized collection. Spleen: Unremarkable. Adrenals/Urinary Tract: Negative adrenals. Chronic bilateral pelviectasis. Simple renal cortical cysts. Unremarkable bladder. Stomach/Bowel:  No obstruction. No visible bowel inflammation. Vascular/Lymphatic: No acute vascular abnormality. Scattered atheromatous calcifications of the aorta and iliacs. No mass or adenopathy. Reproductive:Hysterectomy. Other: Trace reactive fluid in the pelvis. Musculoskeletal: No acute abnormalities. Advanced lumbar spine degeneration with mild scoliosis. IMPRESSION: Acute edematous pancreatitis. Calcifications at the uncinate process and accessory duct suggesting chronic inflammation as well. Electronically Signed   By: JJorje GuildM.D.   On:  10/07/2021 07:59    Pending Labs Unresulted Labs (From admission, onward)     Start     Ordered   10/08/21 0500  Lipase, blood  Tomorrow morning,   R        10/07/21 0954   10/08/21 0500  HIV Antibody (routine testing w rflx)  (HIV Antibody (Routine testing w reflex) panel)  Tomorrow morning,   R        10/07/21 0954   10/08/21 0500  Comprehensive metabolic panel  Tomorrow morning,   R        10/07/21 0954   10/08/21 0500  CBC  Tomorrow morning,   R        10/07/21 0954   10/07/21 0952  TSH  Once,   R        10/07/21 0954   10/07/21 0917  Magnesium  Add-on,   AD        10/07/21 0916            Vitals/Pain Today's Vitals   10/07/21 0800 10/07/21 0807 10/07/21 0845 10/07/21 0930  BP: 100/74  (!) 143/87 138/89  Pulse: 90  82 78  Resp: _0 Temp:      SpO2: 97%  100% 98%  Weight:      Height:      PainSc:  1       Isolation Precautions No active isolations  Medications Medications  thiamine (B-1) injection 100 mg (100 mg Intravenous Given 10/07/21 0918)  aspirin tablet 325 mg (has no administration in time range)  metoprolol succinate (TOPROL-XL) 24 hr tablet 100 mg (has no administration in time range)  nicotine (NICODERM CQ - dosed in mg/24 hours) patch 21 mg (has no administration in time range)  meclizine (ANTIVERT) tablet 25 mg (has no administration in time range)  pantoprazole (PROTONIX) EC tablet 40 mg (has no administration in time range)  LORazepam (ATIVAN) tablet 1-4 mg (has no administration in time range)    Or  LORazepam (ATIVAN) injection 1-4 mg (has no administration in time range)  folic acid (FOLVITE) tablet 1 mg (  has no administration in time range)  multivitamin with minerals tablet 1 tablet (has no administration in time range)  enoxaparin (LOVENOX) injection 40 mg (has no administration in time range)  acetaminophen (TYLENOL) tablet 650 mg (has no administration in time range)    Or  acetaminophen (TYLENOL) suppository 650 mg (has no  administration in time range)  oxyCODONE (Oxy IR/ROXICODONE) immediate release tablet 5 mg (has no administration in time range)  morphine (PF) 2 MG/ML injection 2 mg (has no administration in time range)  ondansetron (ZOFRAN) tablet 4 mg (has no administration in time range)    Or  ondansetron (ZOFRAN) injection 4 mg (has no administration in time range)  albuterol (PROVENTIL) (2.5 MG/3ML) 0.083% nebulizer solution 2.5 mg (has no administration in time range)  lactated ringers bolus 2,000 mL (0 mLs Intravenous Stopped 10/07/21 1031)  morphine (PF) 4 MG/ML injection 4 mg (4 mg Intravenous Given 10/07/21 0641)  ondansetron (ZOFRAN) injection 4 mg (4 mg Intravenous Given 10/07/21 0640)  iohexol (OMNIPAQUE) 300 MG/ML solution 100 mL (100 mLs Intravenous Contrast Given 10/07/21 0719)  morphine (PF) 2 MG/ML injection 2 mg (2 mg Intravenous Given 10/07/21 2023)    Mobility walks Low fall risk    R Recommendations: See Admitting Provider Note  Report given to: Gomez Cleverly RN  Additional Notes:

## 2021-10-07 NOTE — ED Notes (Signed)
The pt converted to a nsr

## 2021-10-07 NOTE — ED Triage Notes (Signed)
Pt c/o abdominal pain, nausea/vomiting x 3 days. Denies chest pain or urinary symptoms.

## 2021-10-07 NOTE — ED Provider Notes (Signed)
[facility] EMERGENCY DEPARTMENT Provider Note   CSN: [id number] Arrival date & time: 10/07/21  4098     History  Chief Complaint  Patient presents with   Abdominal Pain    Krista Carter is a 66 y.o. female.  66 yo F with a chief complaints of abdominal pain nausea and vomiting.  This is been going on for about 3 to 4 days.  Pain to the abdomen is worse in the epigastrium and right upper quadrant but kind of hurts all over.  This has a loose stools with this as well.  Denies suspicious food intake denies dark stool or blood in her stool.  She has been feeling very weak and fatigued and thinks that she is quite dehydrated.   Abdominal Pain      Home Medications Prior to Admission medications   Medication Sig Start Date End Date Taking? Authorizing Provider  aspirin EC 81 MG tablet Take 81 mg by mouth daily.      [provider]  benzonatate (TESSALON) 100 MG capsule Take 1 capsule (100 mg total) by mouth every 8 (eight) hours. 04/28/19   Avegno, Krista Hillock, FNP  clonazePAM (KLONOPIN) 0.5 MG tablet Take 0.5 mg by mouth 2 (two) times daily as needed. Anxiety 02/01/15   [provider]  diazepam (VALIUM) 5 MG tablet TAKE 1/2 TO 1 (ONE-HALF TO ONE) TABLET BY MOUTH EVERY 8 HOURS AS NEEDED (VERTIGO) 08/14/16   Sater, Krista Means, MD  doxycycline (VIBRAMYCIN) 100 MG capsule Take 1 capsule (100 mg total) by mouth 2 (two) times daily. 03/21/18   [name], MD  fluticasone (FLONASE) 50 MCG/ACT nasal spray Place 1 spray into both nostrils daily for 14 days. 04/28/19 05/12/19  Avegno, Krista Hillock, FNP  ibuprofen (ADVIL,MOTRIN) 400 MG tablet Take 1 tablet (400 mg total) by mouth every 6 (six) hours as needed. 03/21/18   [name], MD  lisinopril (PRINIVIL,ZESTRIL) 10 MG tablet Take 10 mg by mouth daily.    [provider]  meclizine (ANTIVERT) 25 MG tablet Take 1 tablet (25 mg total) by mouth 3 (three) times daily as needed for dizziness. 05/18/16   Sherwood Gambler, MD  metoprolol succinate (TOPROL-XL) 100 MG 24 hr tablet Take 100 mg by mouth daily. 02/02/15   [provider]  nicotine (NICODERM CQ) 21 mg/24hr patch Place 1 patch (21 mg total) onto the skin daily. 04/14/15   Dhungel, Flonnie Overman, MD  omeprazole (PRILOSEC) 20 MG capsule Take 1 capsule (20 mg total) by mouth daily. 05/07/19   Domenic Moras, PA-C  ondansetron (ZOFRAN) 4 MG tablet Take 1 tablet (4 mg total) by mouth every 8 (eight) hours as needed for nausea or vomiting. 05/07/19   Domenic Moras, PA-C  oxyCODONE (OXY IR/ROXICODONE) 5 MG immediate release tablet Take 1 tablet (5 mg total) by mouth every 4 (four) hours as needed for moderate pain. 04/14/15   Dhungel, [name], MD  pantoprazole (PROTONIX) 40 MG tablet Take 1 tablet (40 mg total) by mouth 2 (two) times daily. 04/14/15   Dhungel, Flonnie Overman, MD  predniSONE (DELTASONE) 10 MG tablet Take 6 po x 1d, 5 po x 1d, 4 po x 1d, 3 po x 1d, 2 po x 1d, 1 po x1d 07/05/16   Sater, Krista Means, MD  sucralfate (CARAFATE) 1 g tablet Take 1 tablet (1 g total) by mouth 4 (four) times daily -  with meals and at bedtime. 04/14/15   Dhungel, Flonnie Overman, MD  zolpidem (AMBIEN) 10 MG  tablet Take 10 mg by mouth at bedtime as needed for sleep. 06/26/16   [provider]      Allergies    Patient has no known allergies.    Review of Systems   Review of Systems  Gastrointestinal:  Positive for abdominal pain.    Physical Exam Updated Vital Signs BP 92/62   Pulse (!) 110   Temp 98.5 F (36.9 C)   Resp (!) 25   Ht 5' 4.5" (1.638 m)   Wt 63.4 kg   SpO2 99%   BMI 23.62 kg/m  Physical Exam Vitals and nursing note reviewed.  Constitutional:      General: She is not in acute distress.    Appearance: She is well-developed. She is not diaphoretic.  HENT:     Head: Normocephalic and atraumatic.  Eyes:     Pupils: Pupils are equal, round, and reactive to light.  Cardiovascular:     Rate and Rhythm: Normal rate and regular rhythm.     Heart sounds: No  murmur heard.    No friction rub. No gallop.  Pulmonary:     Effort: Pulmonary effort is normal.     Breath sounds: No wheezing or rales.  Abdominal:     General: There is no distension.     Palpations: Abdomen is soft.     Tenderness: There is abdominal tenderness.     Comments: Diffuse abdominal discomfort worse in the epigastrium and right upper quadrant.  Musculoskeletal:        General: No tenderness.     Cervical back: Normal range of motion and neck supple.  Skin:    General: Skin is warm and dry.  Neurological:     Mental Status: She is alert and oriented to person, place, and time.  Psychiatric:        Behavior: Behavior normal.     ED Results / Procedures / Treatments   Labs (all labs ordered are listed, but only abnormal results are displayed) Labs Reviewed  LIPASE, BLOOD - Abnormal; Notable for the following components:      Result Value   Lipase 214 (*)    All other components within normal limits  COMPREHENSIVE METABOLIC PANEL - Abnormal; Notable for the following components:   Glucose, Bld 103 (*)    Albumin 3.4 (*)    All other components within normal limits  CBC - Abnormal; Notable for the following components:   WBC 11.1 (*)    RDW 16.4 (*)    All other components within normal limits  URINALYSIS, ROUTINE W REFLEX MICROSCOPIC  LACTIC ACID, PLASMA    EKG EKG Interpretation  Date/Time:  Saturday October 07 2021 06:48:13 EDT Ventricular Rate:  98 PR Interval:  141 QRS Duration: 93 QT Interval:  353 QTC Calculation: 451 R Axis:   46 Text Interpretation: Sinus rhythm Low voltage, precordial leads Otherwise no significant change Confirmed by Deno Etienne [phone]) on 10/07/2021 7:00:32 AM  Radiology No results found.  Procedures Procedures    Medications Ordered in ED Medications  lactated ringers bolus 2,000 mL (2,000 mLs Intravenous New Bag/Given 10/07/21 3151)  morphine (PF) 4 MG/ML injection 4 mg (4 mg Intravenous Given 10/07/21 0641)  ondansetron  (ZOFRAN) injection 4 mg (4 mg Intravenous Given 10/07/21 7616)    ED Course/ Medical Decision Making/ A&P                           Medical Decision Making  Amount and/or Complexity of Data Reviewed Labs: ordered. Radiology: ordered.  Risk Prescription drug management.   66 yo F with a chief complaints of abdominal pain nausea and vomiting.  This been going on for about 3 to 4 days now.  She is quite tachycardic on arrival she is in A-fib with RVR with rates into the 190s.  I suspect she is very dehydrated based on history we will give 2 L of IV fluids upfront.  We will treat pain aggressively.  Lab work CT scan started on heparin for new onset A-fib.  Patient had spontaneously converted to sinus tachycardia.  We will hold off on heparin administration.  Lipase is elevated without LFT elevation.  Awaiting CT imaging.  Signed out to Dr. Pearline Cables, please see their note for further details of care in the ED.  The patients results and plan were reviewed and discussed.   Any x-rays performed were independently reviewed by myself.   Differential diagnosis were considered with the presenting HPI.  Medications  lactated ringers bolus 2,000 mL (2,000 mLs Intravenous New Bag/Given 10/07/21 6283)  morphine (PF) 4 MG/ML injection 4 mg (4 mg Intravenous Given 10/07/21 0641)  ondansetron (ZOFRAN) injection 4 mg (4 mg Intravenous Given 10/07/21 0640)    Vitals:   10/07/21 0534 10/07/21 0600 10/07/21 0615  BP: 121/83  92/62  Pulse: (!) 110    Resp: 16  (!) 25  Temp: 98.5 F (36.9 C)    SpO2: 100%  99%  Weight:  63.4 kg   Height:  5' 4.5" (1.638 m)     Final diagnoses:  Acute pancreatitis, unspecified complication status, unspecified pancreatitis type            Final Clinical Impression(s) / ED Diagnoses Final diagnoses:  Acute pancreatitis, unspecified complication status, unspecified pancreatitis type    Rx / DC Orders ED Discharge Orders     None         Deno Etienne,  DO 10/07/21 6629

## 2021-10-07 NOTE — ED Notes (Signed)
Dr Tyrone Nine  discontinued the heparin order

## 2021-10-07 NOTE — ED Provider Notes (Signed)
7:01 AM Patient signed out to me by previous ED physician. Pt is 66 yo female presenting to ED with nausea, vomiting, and diarrhea. RUQ tenderness. Lipase elevated concerning for pancreatitis. Also presented in Afib with RVR but spontaneously converted.   Plan: Pending CT scan.   Physical Exam  BP 92/62   Pulse (!) 110   Temp 98.5 F (36.9 C)   Resp (!) 25   Ht 5' 4.5" (1.638 m)   Wt 63.4 kg   SpO2 99%   BMI 23.62 kg/m   Physical Exam Vitals and nursing note reviewed.  Constitutional:      General: She is not in acute distress.    Appearance: She is well-developed.  HENT:     Head: Normocephalic and atraumatic.  Eyes:     Conjunctiva/sclera: Conjunctivae normal.  Cardiovascular:     Rate and Rhythm: Normal rate and regular rhythm.     Heart sounds: No murmur heard. Pulmonary:     Effort: Pulmonary effort is normal. No respiratory distress.     Breath sounds: Normal breath sounds.  Abdominal:     Palpations: Abdomen is soft.     Tenderness: There is abdominal tenderness in the right upper quadrant and epigastric area.  Musculoskeletal:        General: No swelling.     Cervical back: Neck supple.  Skin:    General: Skin is warm and dry.     Capillary Refill: Capillary refill takes less than 2 seconds.  Neurological:     Mental Status: She is alert.  Psychiatric:        Mood and Affect: Mood normal.     Procedures  Procedures  ED Course / MDM    Medical Decision Making Amount and/or Complexity of Data Reviewed Labs: ordered. Radiology: ordered.  Risk Prescription drug management.   8:14 AM Ct abdomen/pelvis with IV contrast demonstrates acute pancreatitis. Will continue IVF and medication for pain control. Thiamine and folic acid given. Pt states she is not a daily drinking but does have more than 4 drinks on random days throughout the week. States she does not have concerns for withdrawal while in hospital.  Pt accepted by admitting physician Dr. Maryland Pink.         Campbell Stall P, DO 18/56/31 [phone]

## 2021-10-07 NOTE — Progress Notes (Signed)
  Echocardiogram 2D Echocardiogram has been performed.  Krista Carter F 10/07/2021, 6:02 PM

## 2021-10-07 NOTE — Progress Notes (Signed)
ANTICOAGULATION CONSULT NOTE - Initial Consult  Pharmacy Consult for heparin Indication: atrial fibrillation  No Known Allergies  Patient Measurements: Height: 5' 4.5" (163.8 cm) Weight: 63.4 kg (139 lb 12.4 oz) IBW/kg (Calculated) : 55.85  Vital Signs: Temp: 98.5 F (36.9 C) (07/08 0534) BP: 121/83 (07/08 0534) Pulse Rate: 110 (07/08 0534)  Labs: Recent Labs    10/07/21 0549  HGB 14.5  HCT 42.0  PLT 293  CREATININE 0.86    Estimated Creatinine Clearance: 58.3 mL/min (by C-G formula based on SCr of 0.86 mg/dL).   Medical History: Past Medical History:  Diagnosis Date   Adrenal adenoma 02/2015   left, seen on CT.    Anemia 07/2010, 04/2015   normocytic.    Depression with anxiety 2008   Diabetes mellitus 2011   type 2.    Duodenitis 2005   HH also seen on EGD.    Dyslipidemia 2011   Expressive aphasia 03/2010   subjective word finding difficulty and slurred speach.  MRI/MRA, CT scan: mild small vessel disease, mild narrowing bilateral int carotids and RCA   Hydronephrosis 2010   per ultrasound: mild, chronic, non-obstructing, bilateral.   Hypertension 2011   IBS (irritable bowel syndrome) 2008   diarrhea prone   Mitral valve regurgitation 03/2010   Mild MVR, EF 60-65% per echo.   Multiple gastric ulcers 09/2013   Benign, due to ASA powders.  H Pylori negative.     Renal insufficiency 2014   AKI 10/2012 due to dehydration, acute (probably infectious/viral) colitis, ACE use.    Severe protein-calorie malnutrition ([place]) 10/2012   Tibia/fibula fracture 04/2015   left.  Dital tibia, proximal fibula.     Assessment: 66yo female c/o abdominal pain and N/V/D x3d, found to be in Afib w/ RVR to the 190s >> to begin heparin.  Goal of Therapy:  Heparin level 0.3-0.7 units/ml Monitor platelets by anticoagulation protocol: Yes   Plan:  Heparin 3000 units IV bolus x1 followed by infusion at 900 units/hr. Monitor heparin levels and CBC.  Wynona Neat, PharmD, BCPS   10/07/2021,6:38 AM

## 2021-10-08 DIAGNOSIS — Z7982 Long term (current) use of aspirin: Secondary | ICD-10-CM | POA: Diagnosis not present

## 2021-10-08 DIAGNOSIS — I340 Nonrheumatic mitral (valve) insufficiency: Secondary | ICD-10-CM | POA: Diagnosis present

## 2021-10-08 DIAGNOSIS — E785 Hyperlipidemia, unspecified: Secondary | ICD-10-CM | POA: Diagnosis present

## 2021-10-08 DIAGNOSIS — Z8051 Family history of malignant neoplasm of kidney: Secondary | ICD-10-CM | POA: Diagnosis not present

## 2021-10-08 DIAGNOSIS — Z800 Family history of malignant neoplasm of digestive organs: Secondary | ICD-10-CM | POA: Diagnosis not present

## 2021-10-08 DIAGNOSIS — I10 Essential (primary) hypertension: Secondary | ICD-10-CM | POA: Diagnosis present

## 2021-10-08 DIAGNOSIS — K852 Alcohol induced acute pancreatitis without necrosis or infection: Secondary | ICD-10-CM | POA: Diagnosis present

## 2021-10-08 DIAGNOSIS — I480 Paroxysmal atrial fibrillation: Secondary | ICD-10-CM | POA: Diagnosis present

## 2021-10-08 DIAGNOSIS — E119 Type 2 diabetes mellitus without complications: Secondary | ICD-10-CM | POA: Diagnosis present

## 2021-10-08 DIAGNOSIS — Z823 Family history of stroke: Secondary | ICD-10-CM | POA: Diagnosis not present

## 2021-10-08 DIAGNOSIS — E860 Dehydration: Secondary | ICD-10-CM | POA: Diagnosis present

## 2021-10-08 DIAGNOSIS — K580 Irritable bowel syndrome with diarrhea: Secondary | ICD-10-CM | POA: Diagnosis present

## 2021-10-08 DIAGNOSIS — Z79899 Other long term (current) drug therapy: Secondary | ICD-10-CM | POA: Diagnosis not present

## 2021-10-08 DIAGNOSIS — F17210 Nicotine dependence, cigarettes, uncomplicated: Secondary | ICD-10-CM | POA: Diagnosis present

## 2021-10-08 LAB — LIPID PANEL
Cholesterol: 160 mg/dL (ref 0–200)
HDL: 58 mg/dL (ref >40)
LDL Cholesterol: 80 mg/dL (ref 0–99)
Total CHOL/HDL Ratio: 2.8 RATIO
Triglycerides: 110 mg/dL (ref <150)
VLDL: 22 mg/dL (ref 0–40)

## 2021-10-08 LAB — CBC
HCT: 37.1 % (ref 36.0–46.0)
Hemoglobin: 12.8 g/dL (ref 12.0–15.0)
MCH: 33.2 pg (ref 26.0–34.0)
MCHC: 34.5 g/dL (ref 30.0–36.0)
MCV: 96.4 fL (ref 80.0–100.0)
Platelets: 246 10*3/uL (ref 150–400)
RBC: 3.85 MIL/uL — ABNORMAL LOW (ref 3.87–5.11)
RDW: 16.6 % — ABNORMAL HIGH (ref 11.5–15.5)
WBC: 7.6 10*3/uL (ref 4.0–10.5)
nRBC: 0 % (ref 0.0–0.2)

## 2021-10-08 LAB — MAGNESIUM: Magnesium: 1.7 mg/dL (ref 1.7–2.4)

## 2021-10-08 LAB — COMPREHENSIVE METABOLIC PANEL
ALT: 7 U/L (ref 0–44)
AST: 10 U/L — ABNORMAL LOW (ref 15–41)
Albumin: 2.6 g/dL — ABNORMAL LOW (ref 3.5–5.0)
Alkaline Phosphatase: 82 U/L (ref 38–126)
Anion gap: 8 (ref 5–15)
BUN: 7 mg/dL — ABNORMAL LOW (ref 8–23)
CO2: 22 mmol/L (ref 22–32)
Calcium: 8.2 mg/dL — ABNORMAL LOW (ref 8.9–10.3)
Chloride: 107 mmol/L (ref 98–111)
Creatinine, Ser: 0.69 mg/dL (ref 0.44–1.00)
GFR, Estimated: >60 mL/min (ref >60)
Glucose, Bld: 79 mg/dL (ref 70–99)
Potassium: 3.6 mmol/L (ref 3.5–5.1)
Sodium: 137 mmol/L (ref 135–145)
Total Bilirubin: 0.3 mg/dL (ref 0.3–1.2)
Total Protein: 5.7 g/dL — ABNORMAL LOW (ref 6.5–8.1)

## 2021-10-08 LAB — LIPASE, BLOOD: Lipase: 153 U/L — ABNORMAL HIGH (ref 11–51)

## 2021-10-08 LAB — ECHOCARDIOGRAM COMPLETE
Area-P 1/2: 3.48 cm2
Height: 64 in
S' Lateral: 2.6 cm
Weight: 2225.76 oz

## 2021-10-08 LAB — HIV ANTIBODY (ROUTINE TESTING W REFLEX): HIV Screen 4th Generation wRfx: NONREACTIVE

## 2021-10-08 MED ORDER — POTASSIUM CHLORIDE CRYS ER 20 MEQ PO TBCR
40.0000 meq | EXTENDED_RELEASE_TABLET | Freq: Once | ORAL | Status: AC
  Administered 2021-10-08: 40 meq via ORAL
  Filled 2021-10-08: qty 2

## 2021-10-08 MED ORDER — MAGNESIUM SULFATE 2 GM/50ML IV SOLN
2.0000 g | Freq: Once | INTRAVENOUS | Status: AC
Start: 2021-10-08 — End: 2021-10-08
  Administered 2021-10-08: 2 g via INTRAVENOUS
  Filled 2021-10-08: qty 50

## 2021-10-08 NOTE — TOC Initial Note (Signed)
Transition of Care [facility]) - Initial/Assessment Note    Patient Details  Name: Krista Carter MRN: [id number] Date of Birth: Jul 31, 1955  Transition of Care [facility]) CM/SW Contact:    Pollie Friar, RN Phone Number: 10/08/2021, 3:47 PM  Clinical Narrative:                 Patient is from home with sister, niece, ex brother in law. She has support after a hospital stay. No DME at home.  She drives self and denies any issues with home medications.  TOC following for d/c needs.   Expected Discharge Plan: Home/Self Care Barriers to Discharge: Continued Medical Work up   Patient Goals and CMS Choice        Expected Discharge Plan and Services Expected Discharge Plan: Home/Self Care                                              Prior Living Arrangements/Services   Lives with:: Siblings Patient language and need for interpreter reviewed:: Yes Do you feel safe going back to the place where you live?: Yes        Care giver support system in place?: Yes (comment)   Criminal Activity/Legal Involvement Pertinent to Current Situation/Hospitalization: No - Comment as needed  Activities of Daily Living Home Assistive Devices/Equipment: None ADL Screening (condition at time of admission) Patient's cognitive ability adequate to safely complete daily activities?: Yes Is the patient deaf or have difficulty hearing?: No Does the patient have difficulty seeing, even when wearing glasses/contacts?: No Does the patient have difficulty concentrating, remembering, or making decisions?: No Patient able to express need for assistance with ADLs?: Yes Does the patient have difficulty dressing or bathing?: No Independently performs ADLs?: Yes (appropriate for developmental age) Does the patient have difficulty walking or climbing stairs?: No Weakness of Legs: None Weakness of Arms/Hands: None  Permission Sought/Granted                  Emotional Assessment Appearance:: Appears  stated age Attitude/Demeanor/Rapport: Engaged Affect (typically observed): Accepting Orientation: : Oriented to Self, Oriented to Place, Oriented to  Time, Oriented to Situation   Psych Involvement: No (comment)  Admission diagnosis:  Acute alcoholic pancreatitis [Y40.34] Acute pancreatitis, unspecified complication status, unspecified pancreatitis type [K85.90] Patient Active Problem List   Diagnosis Date Noted   Alcohol induced acute pancreatitis 10/07/2021   PAF (paroxysmal atrial fibrillation) ([place]) 74/25/9563   Acute alcoholic pancreatitis 87/56/4332   Benign positional vertigo 07/05/2016   Slurred speech 07/05/2016   Gait disturbance 07/05/2016   Acute upper GI bleed 04/14/2015   Gastric ulcer with hemorrhage 04/14/2015   Closed left fibular fracture 04/14/2015   Faintness    Acute gastric ulcer    History of peptic ulcer disease    Emesis    Hematemesis with nausea    Syncope 04/11/2015   Dizziness 11/27/2013   Chest pain 11/27/2013   AKI (acute kidney injury) ([place]) 11/27/2013   Dehydration 11/27/2013   Hypotension 11/27/2013   Loss of weight 08/17/2013   Diarrhea 08/17/2013   Nausea alone 08/17/2013   Generalized abdominal pain 08/17/2013   Protein-calorie malnutrition, severe ([place]) 11/26/2012   Nausea vomiting and diarrhea 11/25/2012   Hypokalemia 11/25/2012   Acute renal failure ([place]) 11/25/2012   APNEA 02/17/2010   SNORING 02/17/2010   OTH SPEC PERS HX PRESENTING HAZARDS  HEALTH OTH 02/17/2010   HYPERLIPIDEMIA 06/10/2007   SMOKER 06/10/2007   Essential hypertension 06/10/2007   GERD 06/10/2007   IBS 06/10/2007   RENAL CALCULUS 06/10/2007   DUODENITIS 04/06/2003   HIATAL HERNIA 04/06/2003   PCP:  [facility]:   [facility] [address] [place] - [place] [address] [place] [address] Phone: [phone] Fax: [phone]  [id number] [place] [address]Phone: [phone]: [phone]    Social Determinants of Health (SDOH) Interventions    Readmission Risk Interventions     No data to display

## 2021-10-08 NOTE — Progress Notes (Signed)
[facility] H[facility]ITAL[facility] PROGRESS NOTE   Krista Carter BSW:[id number] DOB: 1955-04-23 DOA: 10/07/2021  PCP: [facility]  Brief History/Interval Summary: 66 y.o. female with a past medical history of irritable bowel syndrome, essential hypertension, tobacco abuse who consumed large amounts of alcohol for a week or so prior to admission.  She was averaging more than 5 drinks per day.  Presented with nausea vomiting diarrhea and upper abdominal pain.  Was found to have acute pancreatitis.  Was hospitalized for further management.  Consultants: None  Procedures: None    Subjective/Interval History: Complains of 8 of 10 upper abdominal pain radiating to the back.  Some nausea but no vomiting.  No chest pain or shortness of breath.    Assessment/Plan:  Acute alcoholic pancreatitis Lipase level was elevated at 214.  She underwent CT scan which showed pancreatitis. This is likely triggered by recent alcohol consumption.  CT scan did not show any gallstones. Lipase level has improved.  She continues to have upper abdominal tenderness.  Continue IV fluids.  Continue just clear liquid diet for now. Triglyceride 110.  Transient atrial fibrillation with RVR Presented with RVR but spontaneously converted to sinus rhythm within a few minutes in the ED. This was likely triggered by acute pain nausea vomiting diarrhea and resultant dehydration. TSH normal at 0.92.  Echocardiogram report is pending. Patient started on aspirin. CHADS2 vascular score as of now is 2.  However her episode of atrial fibrillation was very brief.  We will determine need for anticoagulation depending on echocardiogram and further discussions with patient.  She may benefit from a 30-day event monitor to further characterize her palpitations. Continue just DVT prophylaxis for now.  Alcohol abuse Does not admit to a longstanding history of alcohol abuse.  Continue CIWA protocol.  Thiamine  multivitamins.  Essential hypertension Metoprolol to be continued.  ACE inhibitor on hold.  Blood pressure is reasonably well controlled.  Supplement potassium and magnesium.   DVT Prophylaxis: Lovenox Code Status: Full code Family Communication: Discussed with patient Disposition Plan: Hopefully return home when improved.  Mobilize.  Status is: Observation The patient will require care spanning > 2 midnights and should be moved to inpatient because: Acute pancreatitis, persistent abdominal pain.      Medications: Scheduled:  aspirin  325 mg Oral Daily   enoxaparin (LOVENOX) injection  40 mg Subcutaneous G66Z   folic acid  1 mg Oral Daily   metoprolol tartrate  25 mg Oral BID   multivitamin with minerals  1 tablet Oral Daily   nicotine  21 mg Transdermal Daily   pantoprazole  40 mg Oral BID   pneumococcal 20-valent conjugate vaccine  0.5 mL Intramuscular Tomorrow-1000   thiamine injection  100 mg Intravenous Daily   Continuous:  0.9 % NaCl with KCl 20 mEq / L 125 mL/hr at 10/08/21 0807   LDJ:[id number] **OR** acetaminophen, albuterol, LORazepam **OR** LORazepam, meclizine, morphine injection, ondansetron **OR** ondansetron (ZOFRAN) IV, oxyCODONE  Antibiotics: Anti-infectives (From admission, onward)    None       Objective:  Vital Signs  Vitals:   10/07/21 2040 10/08/21 0003 10/08/21 0403 10/08/21 0726  BP: 133/77 119/73 117/65 115/76  Pulse: 78 65 70 72  Resp: '18 16 18 18  '$ Temp: 98.4 F (36.9 C) 98.8 F (37.1 C) 98.7 F (37.1 C) 98.1 F (36.7 C)  TempSrc: Oral Oral Oral Oral  SpO2: 94% 97% 95% 97%  Weight:      Height:  Intake/Output Summary (Last 24 hours) at 10/08/2021 1110 Last data filed at 10/08/2021 0900 Gross per 24 hour  Intake 2371.4 ml  Output 750 ml  Net 1621.4 ml   Filed Weights   10/07/21 0600 10/07/21 1328  Weight: 63.4 kg 63.1 kg    General appearance: Awake alert.  In no distress Resp: Clear to auscultation bilaterally.   Normal effort Cardio: S1-S2 is normal regular.  No S3-S4.  No rubs murmurs or bruit.  Telemetry reviewed.  No recurrence of atrial fibrillation noted. GI: Abdomen is soft.  Tender in the epigastric area without any rebound rigidity or guarding.  No masses organomegaly Extremities: No edema.  Full range of motion of lower extremities. Neurologic: Alert and oriented x3.  No focal neurological deficits.    Lab Results:  Data Reviewed: I have personally reviewed following labs and reports of the imaging studies  CBC: Recent Labs  Lab 10/07/21 0549 10/08/21 0234  WBC 11.1* 7.6  HGB 14.5 12.8  HCT 42.0 37.1  MCV 96.1 96.4  PLT 293 161    Basic Metabolic Panel: Recent Labs  Lab 10/07/21 0549 10/08/21 0234  NA 139 137  K 3.5 3.6  CL 101 107  CO2 24 22  GLUCOSE 103* 79  BUN 11 7*  CREATININE 0.86 0.69  CALCIUM 9.6 8.2*  MG 1.8 1.7    GFR: Estimated Creatinine Clearance: 61.3 mL/min (by C-G formula based on SCr of 0.69 mg/dL).  Liver Function Tests: Recent Labs  Lab 10/07/21 0549 10/08/21 0234  AST 15 10*  ALT 11 7  ALKPHOS 102 82  BILITOT 0.5 0.3  PROT 6.7 5.7*  ALBUMIN 3.4* 2.6*    Recent Labs  Lab 10/07/21 0549 10/08/21 0234  LIPASE 214* 153*     Lipid Profile: Recent Labs    10/08/21 0234  CHOL 160  HDL 58  LDLCALC 80  TRIG 110  CHOLHDL 2.8    Thyroid Function Tests: Recent Labs    10/07/21 1416  TSH 0.928    Radiology Studies: CT ABDOMEN PELVIS W CONTRAST  Result Date: 10/07/2021 CLINICAL DATA:  Acute, nonlocalized abdominal pain. Nausea and vomiting for 3 days EXAM: CT ABDOMEN AND PELVIS WITH CONTRAST TECHNIQUE: Multidetector CT imaging of the abdomen and pelvis was performed using the standard protocol following bolus administration of intravenous contrast. RADIATION DOSE REDUCTION: This exam was performed according to the departmental dose-optimization program which includes automated exposure control, adjustment of the mA and/or kV  according to patient size and/or use of iterative reconstruction technique. CONTRAST:  121m OMNIPAQUE IOHEXOL 300 MG/ML  SOLN COMPARISON:  02/28/2015 FINDINGS: Lower chest:  Atelectasis at the lung bases. Hepatobiliary: No focal liver abnormality.No evidence of biliary obstruction or stone. Pancreas: Peripancreatic fat stranding. Coarse calcifications in the uncinate process an accessory duct of the pancreas suggesting chronic inflammation. Upper normal main duct at the head measuring 3 mm. No organized collection. Spleen: Unremarkable. Adrenals/Urinary Tract: Negative adrenals. Chronic bilateral pelviectasis. Simple renal cortical cysts. Unremarkable bladder. Stomach/Bowel:  No obstruction. No visible bowel inflammation. Vascular/Lymphatic: No acute vascular abnormality. Scattered atheromatous calcifications of the aorta and iliacs. No mass or adenopathy. Reproductive:Hysterectomy. Other: Trace reactive fluid in the pelvis. Musculoskeletal: No acute abnormalities. Advanced lumbar spine degeneration with mild scoliosis. IMPRESSION: Acute edematous pancreatitis. Calcifications at the uncinate process and accessory duct suggesting chronic inflammation as well. Electronically Signed   By: JJorje GuildM.D.   On: 10/07/2021 07:59       LOS: 0 days   Krista Carter  [place]  [facility] [email or web address], [email or web address]  10/08/2021, 11:10 AM

## 2021-10-08 NOTE — TOC CAGE-AID Note (Signed)
Transition of Care [facility]) - CAGE-AID Screening   Patient Details  Name: Krista Carter MRN: [id number] Date of Birth: [birth year]  Transition [facility] Care [facility]) CM/SW Contact:    Pollie Friar, RN Phone Number: 10/08/2021, 3:45 PM   Clinical Narrative: Pt states she is not an alcoholic and goes months without drinking. She refused inpatient/ outpatient alcohol counseling.   CAGE-AID Screening:    Have You Ever Felt You Ought to Cut Down on Your Drinking or Drug Use?: No Have People Annoyed You By Critizing Your Drinking Or Drug Use?: No Have You Felt Bad Or Guilty About Your Drinking Or Drug Use?: No Have You Ever Had a Drink or Used Drugs First Thing In The Morning to Steady Your Nerves or to Get Rid of a Hangover?: No CAGE-AID Score: 0  Substance Abuse Education Offered: Yes (pt refused)

## 2021-10-09 LAB — COMPREHENSIVE METABOLIC PANEL
ALT: 8 U/L (ref 0–44)
AST: 12 U/L — ABNORMAL LOW (ref 15–41)
Albumin: 2.4 g/dL — ABNORMAL LOW (ref 3.5–5.0)
Alkaline Phosphatase: 71 U/L (ref 38–126)
Anion gap: 8 (ref 5–15)
BUN: <5 mg/dL — ABNORMAL LOW (ref 8–23)
CO2: 24 mmol/L (ref 22–32)
Calcium: 7.8 mg/dL — ABNORMAL LOW (ref 8.9–10.3)
Chloride: 103 mmol/L (ref 98–111)
Creatinine, Ser: 0.59 mg/dL (ref 0.44–1.00)
GFR, Estimated: >60 mL/min (ref >60)
Glucose, Bld: 76 mg/dL (ref 70–99)
Potassium: 4.3 mmol/L (ref 3.5–5.1)
Sodium: 135 mmol/L (ref 135–145)
Total Bilirubin: 0.4 mg/dL (ref 0.3–1.2)
Total Protein: 5 g/dL — ABNORMAL LOW (ref 6.5–8.1)

## 2021-10-09 LAB — MAGNESIUM: Magnesium: 1.8 mg/dL (ref 1.7–2.4)

## 2021-10-09 MED ORDER — DOCUSATE SODIUM 100 MG PO CAPS
100.0000 mg | ORAL_CAPSULE | Freq: Two times a day (BID) | ORAL | Status: DC
Start: 2021-10-09 — End: 2021-10-10
  Administered 2021-10-09 – 2021-10-10 (×3): 100 mg via ORAL
  Filled 2021-10-09 (×3): qty 1

## 2021-10-09 MED ORDER — THIAMINE HCL 100 MG PO TABS
100.0000 mg | ORAL_TABLET | Freq: Every day | ORAL | Status: DC
  Administered 2021-10-10: 100 mg via ORAL
  Filled 2021-10-09: qty 1

## 2021-10-09 NOTE — Progress Notes (Signed)
[facility] PROGRESS NOTE   Krista Carter BZJ:[id number] DOB: June 06, 1955 DOA: 10/07/2021  PCP: [facility]  Brief History/Interval Summary: 66 y.o. female with a past medical history of irritable bowel syndrome, essential hypertension, tobacco abuse who consumed large amounts of alcohol for a week or so prior to admission.  She was averaging more than 5 drinks per day.  Presented with nausea vomiting diarrhea and upper abdominal pain.  Was found to have acute pancreatitis.  Was hospitalized for further management.  Consultants: None  Procedures: Transthoracic echocardiogram.    Subjective/Interval History: Complains of 5-6 out of 10 abdominal pain.  Some cramping.  Not passing any gas from below.  Has been tolerating clear liquids well.  No chest pain or shortness of breath.    Assessment/Plan:  Acute alcoholic pancreatitis Lipase level was elevated at 214.  She underwent CT scan which showed pancreatitis. This is likely triggered by recent alcohol consumption.  CT scan did not show any gallstones. Lipase level has improved.  Slowly improving.  Advance to full liquids.  Stool softeners.   Triglyceride 110.  Transient atrial fibrillation with RVR Presented with RVR but spontaneously converted to sinus rhythm within a few minutes in the ED. This was likely triggered by acute pain nausea vomiting diarrhea and resultant dehydration. TSH normal at 0.92.  Echocardiogram report is pending. Patient started on aspirin. CHADS2 vascular score as of now is 2.  However her episode of atrial fibrillation was very brief.   Echocardiogram shows normal systolic function.  No significant valvular abnormalities noted.   Considering the recent significant alcohol use and very brief atrial fibrillation we will hold off on full anticoagulation for now.  Continue just DVT prophylaxis for now. We will benefit from 30-day heart monitor due to history of  palpitations.  Alcohol abuse Does not admit to a longstanding history of alcohol abuse.  Continue CIWA protocol.  Thiamine multivitamins.  Essential hypertension Metoprolol to be continued.  ACE inhibitor on hold.  Blood pressure is reasonably well controlled.  Potassium 4.3.  Magnesium 1.8.   DVT Prophylaxis: Lovenox Code Status: Full code Family Communication: Discussed with patient Disposition Plan: Hopefully return home when improved.  Mobilize.  Status is: Inpatient Remains inpatient appropriate because: Acute pancreatitis, transient atrial fibrillation     Medications: Scheduled:  aspirin  325 mg Oral Daily   docusate sodium  100 mg Oral BID   enoxaparin (LOVENOX) injection  40 mg Subcutaneous O17P   folic acid  1 mg Oral Daily   metoprolol tartrate  25 mg Oral BID   multivitamin with minerals  1 tablet Oral Daily   nicotine  21 mg Transdermal Daily   pantoprazole  40 mg Oral BID   pneumococcal 20-valent conjugate vaccine  0.5 mL Intramuscular Tomorrow-1000   thiamine injection  100 mg Intravenous Daily   Continuous:  0.9 % NaCl with KCl 20 mEq / L 125 mL/hr at 10/09/21 0748   ZWC:HENIDPOEUMPNT **OR** acetaminophen, albuterol, LORazepam **OR** LORazepam, meclizine, morphine injection, ondansetron **OR** ondansetron (ZOFRAN) IV, oxyCODONE  Antibiotics: Anti-infectives (From admission, onward)    None       Objective:  Vital Signs  Vitals:   10/08/21 0726 10/08/21 1114 10/08/21 2149 10/09/21 0550  BP: 115/76 132/77 (!) 151/72 133/76  Pulse: 72 61 80 70  Resp: '18 18 18 18  '$ Temp: 98.1 F (36.7 C)  (!) 97.5 F (36.4 C) 98.2 F (36.8 C)  TempSrc: Oral  Oral Oral  SpO2: 97% 96% 97% 93%  Weight:    65.5 kg  Height:        Intake/Output Summary (Last 24 hours) at 10/09/2021 1048 Last data filed at 10/09/2021 0850 Gross per 24 hour  Intake 1432.74 ml  Output 950 ml  Net 482.74 ml    Filed Weights   10/07/21 0600 10/07/21 1328 10/09/21 0550  Weight:  63.4 kg 63.1 kg 65.5 kg    General appearance: Awake alert.  In no distress Resp: Clear to auscultation bilaterally.  Normal effort Cardio: S1-S2 is normal regular.  No S3-S4.  No rubs murmurs or bruit GI: Abdomen is soft.  Mildly tender in the epigastric area without any rebound rigidity or guarding.  Bowel sounds are present.  No masses organomegaly. Extremities: No edema.  Full range of motion of lower extremities. Neurologic: Alert and oriented x3.  No focal neurological deficits.     Lab Results:  Data Reviewed: I have personally reviewed following labs and reports of the imaging studies  CBC: Recent Labs  Lab 10/07/21 0549 10/08/21 0234  WBC 11.1* 7.6  HGB 14.5 12.8  HCT 42.0 37.1  MCV 96.1 96.4  PLT 293 246     Basic Metabolic Panel: Recent Labs  Lab 10/07/21 0549 10/08/21 0234 10/09/21 0330  NA 139 137 135  K 3.5 3.6 4.3  CL 101 107 103  CO2 '24 22 24  '$ GLUCOSE 103* 79 76  BUN 11 7* <5*  CREATININE 0.86 0.69 0.59  CALCIUM 9.6 8.2* 7.8*  MG 1.8 1.7 1.8     GFR: Estimated Creatinine Clearance: 61.3 mL/min (by C-G formula based on SCr of 0.59 mg/dL).  Liver Function Tests: Recent Labs  Lab 10/07/21 0549 10/08/21 0234 10/09/21 0330  AST 15 10* 12*  ALT '11 7 8  '$ ALKPHOS 102 82 71  BILITOT 0.5 0.3 0.4  PROT 6.7 5.7* 5.0*  ALBUMIN 3.4* 2.6* 2.4*     Recent Labs  Lab 10/07/21 0549 10/08/21 0234  LIPASE 214* 153*      Lipid Profile: Recent Labs    10/08/21 0234  CHOL 160  HDL 58  LDLCALC 80  TRIG 110  CHOLHDL 2.8     Thyroid Function Tests: Recent Labs    10/07/21 1416  TSH 0.928     Radiology Studies: ECHOCARDIOGRAM COMPLETE  Result Date: 10/08/2021    ECHOCARDIOGRAM REPORT   [name] Date of Exam: 10/07/2021 Medical Rec #:  [id number]     Height:       64.0 in Accession #:    [id number]    Weight:       139.1 lb Date of Birth:  02-04-1956     BSA:          1.677 m Patient Age:    62 years      BP:            119/68 mmHg Patient Gender: F             HR:           75 bpm. Exam Location:  Inpatient Procedure: 2D Echo, Cardiac Doppler and Color Doppler Indications:    Atrial fibrillation  History:        Patient has prior history of Echocardiogram examinations, most                 recent 04/12/2015.  Sonographer:    Merrie Roof RDCS Referring Phys: [place]  1. Left ventricular ejection fraction, by estimation, is 60 to 65%. The left ventricle has normal function. The left ventricle has no regional wall motion abnormalities. Left ventricular diastolic parameters were normal.  2. Right ventricular systolic function is normal. The right ventricular size is normal. Tricuspid regurgitation signal is inadequate for assessing PA pressure.  3. The mitral valve is grossly normal. No evidence of mitral valve regurgitation. No evidence of mitral stenosis.  4. The aortic valve is tricuspid. There is mild calcification of the aortic valve. Aortic valve regurgitation is not visualized. Aortic valve sclerosis is present, with no evidence of aortic valve stenosis.  5. The inferior vena cava is normal in size with greater than 50% respiratory variability, suggesting right atrial pressure of 3 mmHg. FINDINGS  Left Ventricle: Left ventricular ejection fraction, by estimation, is 60 to 65%. The left ventricle has normal function. The left ventricle has no regional wall motion abnormalities. The left ventricular internal cavity size was normal in size. There is  no left ventricular hypertrophy. Left ventricular diastolic parameters were normal. Right Ventricle: The right ventricular size is normal. No increase in right ventricular wall thickness. Right ventricular systolic function is normal. Tricuspid regurgitation signal is inadequate for assessing PA pressure. Left Atrium: Left atrial size was normal in size. Right Atrium: Right atrial size was normal in size. Pericardium: There is no evidence of pericardial  effusion. Mitral Valve: The mitral valve is grossly normal. No evidence of mitral valve regurgitation. No evidence of mitral valve stenosis. Tricuspid Valve: The tricuspid valve is grossly normal. Tricuspid valve regurgitation is trivial. No evidence of tricuspid stenosis. Aortic Valve: The aortic valve is tricuspid. There is mild calcification of the aortic valve. Aortic valve regurgitation is not visualized. Aortic valve sclerosis is present, with no evidence of aortic valve stenosis. Pulmonic Valve: The pulmonic valve was grossly normal. Pulmonic valve regurgitation is not visualized. No evidence of pulmonic stenosis. Aorta: The aortic root and ascending aorta are structurally normal, with no evidence of dilitation. Venous: The inferior vena cava is normal in size with greater than 50% respiratory variability, suggesting right atrial pressure of 3 mmHg. IAS/Shunts: The atrial septum is grossly normal.  LEFT VENTRICLE PLAX 2D LVIDd:         4.20 cm   Diastology LVIDs:         2.60 cm   LV e' medial:    7.94 cm/s LV PW:         1.00 cm   LV E/e' medial:  7.1 LV IVS:        1.20 cm   LV e' lateral:   10.10 cm/s LVOT diam:     1.90 cm   LV E/e' lateral: 5.6 LV SV:         59 LV SV Index:   35 LVOT Area:     2.84 cm  RIGHT VENTRICLE RV Basal diam:  2.50 cm LEFT ATRIUM             Index        RIGHT ATRIUM          Index LA diam:        2.70 cm 1.61 cm/m   RA Area:     9.08 cm LA Vol (A2C):   37.2 ml 22.19 ml/m  RA Volume:   12.50 ml 7.46 ml/m LA Vol (A4C):   28.6 ml 17.06 ml/m LA Biplane Vol: 34.1 ml 20.34 ml/m  AORTIC VALVE LVOT Vmax:   102.00 cm/s  LVOT Vmean:  69.200 cm/s LVOT VTI:    0.209 m  AORTA Ao Root diam: 3.30 cm Ao Asc diam:  3.20 cm MITRAL VALVE MV Area (PHT): 3.48 cm    SHUNTS MV Decel Time: 218 msec    Systemic VTI:  0.21 m MV E velocity: 56.60 cm/s  Systemic Diam: 1.90 cm MV A velocity: 77.00 cm/s MV E/A ratio:  0.74 Eleonore Chiquito MD Electronically signed by Eleonore Chiquito MD Signature Date/Time:  10/08/2021/12:48:35 PM    Final        LOS: 1 day   [place] Pager on [email or web address]  10/09/2021, 10:48 AM

## 2021-10-09 NOTE — Progress Notes (Signed)
Mobility Specialist Progress Note:   10/09/21 0936  Mobility  Activity Ambulated with assistance in hallway  Level of Assistance Independent after set-up  Assistive Device None  Distance Ambulated (ft) 550 ft  Activity Response Tolerated well  $Mobility charge 1 Mobility   Pt received in bed willing to participate in mobility. No complaints of pain. Left in bed with call bell in reach and all needs met.  [facility] Krista Carter Mobility Specialist

## 2021-10-09 NOTE — Plan of Care (Signed)

## 2021-10-10 ENCOUNTER — Other Ambulatory Visit (HOSPITAL_COMMUNITY): Payer: Self-pay

## 2021-10-10 MED ORDER — OXYCODONE HCL 5 MG PO TABS
5.0000 mg | ORAL_TABLET | Freq: Four times a day (QID) | ORAL | 0 refills | Status: DC | PRN
  Filled 2021-10-10: qty 20, 5d supply, fill #0

## 2021-10-10 NOTE — TOC Transition Note (Signed)
Transition of Care [facility]) - CM/SW Discharge Note   Patient Details  Name: Krista Carter MRN: [id number] Date of Birth: 08-26-1955  Transition of Care [facility]) CM/SW Contact:  Zenon Mayo, RN Phone Number: 10/10/2021, 12:05 PM   Clinical Narrative:    Patient is for dc today, she states her BIL will be transporting her  home today.  TOC to fill her oxy for her.  She has no other needs.      Barriers to Discharge: Continued Medical Work up   Patient Goals and CMS Choice        Discharge Placement                       Discharge Plan and Services                                     Social Determinants of Health (SDOH) Interventions     Readmission Risk Interventions     No data to display

## 2021-10-10 NOTE — Discharge Summary (Signed)
[facility]  Physician Discharge Summary   Patient ID: Krista Carter MRN: [id number] DOB/AGE: 1955/09/23 66 y.o.  Admit date: 10/07/2021 Discharge date:   10/10/2021   PCP: [facility]  DISCHARGE DIAGNOSES:  Principal Problem:   Alcohol induced acute pancreatitis Active Problems:   Essential hypertension   IBS   PAF (paroxysmal atrial fibrillation) (HCC)   Acute alcoholic pancreatitis   RECOMMENDATIONS FOR OUTPATIENT FOLLOW UP: Patient instructed to stop drinking alcohol Referral has been made to cardiology for her transient atrial fibrillation.  She has an appointment next week    Home Health: None Equipment/Devices: None  CODE STATUS: Full code  DISCHARGE CONDITION: fair  Diet recommendation: Soft diet  INITIAL HISTORY:  66 y.o. female with a past medical history of irritable bowel syndrome, essential hypertension, tobacco abuse who consumed large amounts of alcohol for a week or so prior to admission.  She was averaging more than 5 drinks per day.  Presented with nausea vomiting diarrhea and upper abdominal pain.  Was found to have acute pancreatitis.  Was hospitalized for further management.   Consultants: None   Procedures: Transthoracic echocardiogram.    HOSPITAL COURSE:   Acute alcoholic pancreatitis Lipase level was elevated at 214.  She underwent CT scan which showed pancreatitis. This is likely triggered by recent alcohol consumption.  CT scan did not show any gallstones. Lipase level has improved.  Symptoms have improved.  Triglyceride 110.  Advance to soft diet today.  If she tolerates this for lunch she should be able to go home this afternoon.    Transient atrial fibrillation with RVR Presented with RVR but spontaneously converted to sinus rhythm within a few minutes in the ED. This was likely triggered by acute pain nausea vomiting diarrhea and resultant dehydration. TSH normal at 0.92.  Echocardiogram  report is pending. Patient started on aspirin. CHADS2 vascular score as of now is 2.  However her episode of atrial fibrillation was very brief.   Echocardiogram shows normal systolic function.  No significant valvular abnormalities noted.   Considering the recent significant alcohol use and very brief atrial fibrillation we will hold off on full anticoagulation for now.  Continue aspirin. Discussed with Dr. Nadyne Coombes with cardiology.  He has made her an appointment at his office in a week.  He will arrange a 30-day monitor at that time.  He agrees with no anticoagulation at this time.     Alcohol abuse Does not admit to a longstanding history of alcohol abuse.  She was advised to stop drinking alcohol  Essential hypertension Continue home medication    Patient feels better.  Asking to go home.  If she tolerates a soft diet then she should be able to go home this afternoon.  Okay for discharge otherwise.   PERTINENT LABS:  The results of significant diagnostics from this hospitalization (including imaging, microbiology, ancillary and laboratory) are listed below for reference.      Labs:   Basic Metabolic Panel: Recent Labs  Lab 10/07/21 0549 10/08/21 0234 10/09/21 0330  NA 139 137 135  K 3.5 3.6 4.3  CL 101 107 103  CO2 '24 22 24  '$ GLUCOSE 103* 79 76  BUN 11 7* <5*  CREATININE 0.86 0.69 0.59  CALCIUM 9.6 8.2* 7.8*  MG 1.8 1.7 1.8   Liver Function Tests: Recent Labs  Lab 10/07/21 0549 10/08/21 0234 10/09/21 0330  AST 15 10* 12*  ALT '11 7 8  '$ ALKPHOS 102 82 71  BILITOT 0.5 0.3 0.4  PROT 6.7 5.7* 5.0*  ALBUMIN 3.4* 2.6* 2.4*   Recent Labs  Lab 10/07/21 0549 10/08/21 0234  LIPASE 214* 153*    CBC: Recent Labs  Lab 10/07/21 0549 10/08/21 0234  WBC 11.1* 7.6  HGB 14.5 12.8  HCT 42.0 37.1  MCV 96.1 96.4  PLT 293 246    IMAGING STUDIES ECHOCARDIOGRAM COMPLETE  Result Date: 10/08/2021    ECHOCARDIOGRAM REPORT   [name] Date of Exam:  10/07/2021 Medical Rec #:  [id number]     Height:       64.0 in Accession #:    [id number]    Weight:       139.1 lb Date of Birth:  1955/08/26     BSA:          1.677 m Patient Age:    13 years      BP:           119/68 mmHg Patient Gender: F             HR:           75 bpm. Exam Location:  Inpatient Procedure: 2D Echo, Cardiac Doppler and Color Doppler Indications:    Atrial fibrillation  History:        Patient has prior history of Echocardiogram examinations, most                 recent 04/12/2015.  Sonographer:    Merrie Roof RDCS Referring Phys: [place]  1. Left ventricular ejection fraction, by estimation, is 60 to 65%. The left ventricle has normal function. The left ventricle has no regional wall motion abnormalities. Left ventricular diastolic parameters were normal.  2. Right ventricular systolic function is normal. The right ventricular size is normal. Tricuspid regurgitation signal is inadequate for assessing PA pressure.  3. The mitral valve is grossly normal. No evidence of mitral valve regurgitation. No evidence of mitral stenosis.  4. The aortic valve is tricuspid. There is mild calcification of the aortic valve. Aortic valve regurgitation is not visualized. Aortic valve sclerosis is present, with no evidence of aortic valve stenosis.  5. The inferior vena cava is normal in size with greater than 50% respiratory variability, suggesting right atrial pressure of 3 mmHg. FINDINGS  Left Ventricle: Left ventricular ejection fraction, by estimation, is 60 to 65%. The left ventricle has normal function. The left ventricle has no regional wall motion abnormalities. The left ventricular internal cavity size was normal in size. There is  no left ventricular hypertrophy. Left ventricular diastolic parameters were normal. Right Ventricle: The right ventricular size is normal. No increase in right ventricular wall thickness. Right ventricular systolic function is normal. Tricuspid  regurgitation signal is inadequate for assessing PA pressure. Left Atrium: Left atrial size was normal in size. Right Atrium: Right atrial size was normal in size. Pericardium: There is no evidence of pericardial effusion. Mitral Valve: The mitral valve is grossly normal. No evidence of mitral valve regurgitation. No evidence of mitral valve stenosis. Tricuspid Valve: The tricuspid valve is grossly normal. Tricuspid valve regurgitation is trivial. No evidence of tricuspid stenosis. Aortic Valve: The aortic valve is tricuspid. There is mild calcification of the aortic valve. Aortic valve regurgitation is not visualized. Aortic valve sclerosis is present, with no evidence of aortic valve stenosis. Pulmonic Valve: The pulmonic valve was grossly normal. Pulmonic valve regurgitation is not visualized. No evidence of pulmonic stenosis. Aorta: The aortic  root and ascending aorta are structurally normal, with no evidence of dilitation. Venous: The inferior vena cava is normal in size with greater than 50% respiratory variability, suggesting right atrial pressure of 3 mmHg. IAS/Shunts: The atrial septum is grossly normal.  LEFT VENTRICLE PLAX 2D LVIDd:         4.20 cm   Diastology LVIDs:         2.60 cm   LV e' medial:    7.94 cm/s LV PW:         1.00 cm   LV E/e' medial:  7.1 LV IVS:        1.20 cm   LV e' lateral:   10.10 cm/s LVOT diam:     1.90 cm   LV E/e' lateral: 5.6 LV SV:         59 LV SV Index:   35 LVOT Area:     2.84 cm  RIGHT VENTRICLE RV Basal diam:  2.50 cm LEFT ATRIUM             Index        RIGHT ATRIUM          Index LA diam:        2.70 cm 1.61 cm/m   RA Area:     9.08 cm LA Vol (A2C):   37.2 ml 22.19 ml/m  RA Volume:   12.50 ml 7.46 ml/m LA Vol (A4C):   28.6 ml 17.06 ml/m LA Biplane Vol: 34.1 ml 20.34 ml/m  AORTIC VALVE LVOT Vmax:   102.00 cm/s LVOT Vmean:  69.200 cm/s LVOT VTI:    0.209 m  AORTA Ao Root diam: 3.30 cm Ao Asc diam:  3.20 cm MITRAL VALVE MV Area (PHT): 3.48 cm    SHUNTS MV Decel  Time: 218 msec    Systemic VTI:  0.21 m MV E velocity: 56.60 cm/s  Systemic Diam: 1.90 cm MV A velocity: 77.00 cm/s MV E/A ratio:  0.74 Eleonore Chiquito MD Electronically signed by Eleonore Chiquito MD Signature Date/Time: 10/08/2021/12:48:35 PM    Final    CT ABDOMEN PELVIS W CONTRAST  Result Date: 10/07/2021 CLINICAL DATA:  Acute, nonlocalized abdominal pain. Nausea and vomiting for 3 days EXAM: CT ABDOMEN AND PELVIS WITH CONTRAST TECHNIQUE: Multidetector CT imaging of the abdomen and pelvis was performed using the standard protocol following bolus administration of intravenous contrast. RADIATION DOSE REDUCTION: This exam was performed according to the departmental dose-optimization program which includes automated exposure control, adjustment of the mA and/or kV according to patient size and/or use of iterative reconstruction technique. CONTRAST:  111m OMNIPAQUE IOHEXOL 300 MG/ML  SOLN COMPARISON:  02/28/2015 FINDINGS: Lower chest:  Atelectasis at the lung bases. Hepatobiliary: No focal liver abnormality.No evidence of biliary obstruction or stone. Pancreas: Peripancreatic fat stranding. Coarse calcifications in the uncinate process an accessory duct of the pancreas suggesting chronic inflammation. Upper normal main duct at the head measuring 3 mm. No organized collection. Spleen: Unremarkable. Adrenals/Urinary Tract: Negative adrenals. Chronic bilateral pelviectasis. Simple renal cortical cysts. Unremarkable bladder. Stomach/Bowel:  No obstruction. No visible bowel inflammation. Vascular/Lymphatic: No acute vascular abnormality. Scattered atheromatous calcifications of the aorta and iliacs. No mass or adenopathy. Reproductive:Hysterectomy. Other: Trace reactive fluid in the pelvis. Musculoskeletal: No acute abnormalities. Advanced lumbar spine degeneration with mild scoliosis. IMPRESSION: Acute edematous pancreatitis. Calcifications at the uncinate process and accessory duct suggesting chronic inflammation as well.  Electronically Signed   By: JJorje GuildM.D.   On: 10/07/2021 07:59  DISCHARGE EXAMINATION: Vitals:   10/09/21 1201 10/09/21 2321 10/10/21 0501 10/10/21 0730  BP: 116/67 (!) 146/76 129/68 (!) 147/82  Pulse:  69 68 65  Resp:  '18 20 20  '$ Temp:  98.2 F (36.8 C) 98.1 F (36.7 C) 97.8 F (36.6 C)  TempSrc:  Oral Oral Oral  SpO2: 95% 96% 97% 97%  Weight:   64.9 kg   Height:       General appearance: Awake alert.  In no distress Resp: Clear to auscultation bilaterally.  Normal effort Cardio: S1-S2 is normal regular.  No S3-S4.  No rubs murmurs or bruit GI: Abdomen is soft.  Nontender nondistended.  Bowel sounds are present normal.  No masses organomegaly Extremities: No edema.  Full range of motion of lower extremities. Neurologic: Alert and oriented x3.  No focal neurological deficits.    DISPOSITION: Home  Discharge Instructions     Call MD for:  difficulty breathing, headache or visual disturbances   Complete by: As directed    Call MD for:  extreme fatigue   Complete by: As directed    Call MD for:  persistant dizziness or light-headedness   Complete by: As directed    Call MD for:  persistant nausea and vomiting   Complete by: As directed    Call MD for:  severe uncontrolled pain   Complete by: As directed    Call MD for:  temperature >100.4   Complete by: As directed    Diet - low sodium heart healthy   Complete by: As directed    Discharge instructions   Complete by: As directed    Please stop consuming alcohol.  An appointment has been made for you to see cardiology for the atrial fibrillation as we discussed.  The cardiology office will also arrange a heart monitor for you when you see them in their office. Take your medications as prescribed.  Seek attention if your symptoms recur.  Seek attention if you develop palpitations chest pain shortness of breath Or if you feel dizzy or lightheaded.    You were cared for by a hospitalist during your hospital stay. If  you have any questions about your discharge medications or the care you received while you were in the hospital after you are discharged, you can call the unit and asked to speak with the hospitalist on call if the hospitalist that took care of you is not available. Once you are discharged, your primary care physician will handle any further medical issues. Please note that NO REFILLS for any discharge medications will be authorized once you are discharged, as it is imperative that you return to your primary care physician (or establish a relationship with a primary care physician if you do not have one) for your aftercare needs so that they can reassess your need for medications and monitor your lab values. If you do not have a primary care physician, you can call [phone] for a physician referral.   Increase activity slowly   Complete by: As directed           Allergies as of 10/10/2021   No Known Allergies      Medication List     STOP taking these medications    omeprazole 20 MG capsule Commonly known as: PRILOSEC       TAKE these medications    aspirin EC 81 MG tablet Take 81 mg by mouth daily.   clonazePAM 0.5 MG tablet Commonly known as: KLONOPIN Take 0.5 mg  by mouth 2 (two) times daily as needed. Anxiety   lisinopril 10 MG tablet Commonly known as: ZESTRIL Take 10 mg by mouth daily.   metoprolol succinate 100 MG 24 hr tablet Commonly known as: TOPROL-XL Take 100 mg by mouth daily.   oxyCODONE 5 MG immediate release tablet Commonly known as: Oxy IR/ROXICODONE Take 1 tablet (5 mg total) by mouth every 6 (six) hours as needed for severe pain.   pantoprazole 40 MG tablet Commonly known as: PROTONIX Take 1 tablet (40 mg total) by mouth 2 (two) times daily.   rosuvastatin 10 MG tablet Commonly known as: CRESTOR Take 10 mg by mouth daily.   zolpidem 10 MG tablet Commonly known as: AMBIEN Take 10 mg by mouth at bedtime as needed for sleep.           Follow-up Information     Cantwell, Celeste C, PA-C Follow up on 10/18/2021.   Specialty: Cardiology Why: 7/19 '@9'$ :30. Please bring all medications Contact information: [address] [address] [place], [place]. Schedule an appointment as soon as possible for a visit in 1 week(s).   Specialty: Family Medicine Contact information: [address] [place] [address] [phone]                 TOTAL DISCHARGE TIME: 22 minutes  [place]  [facility] Pager on [email or web address]  10/10/2021, 12:53 PM

## 2021-10-18 ENCOUNTER — Ambulatory Visit: Payer: BC Managed Care – PPO | Admitting: Student

## 2021-10-18 ENCOUNTER — Inpatient Hospital Stay: Payer: BC Managed Care – PPO

## 2021-10-18 ENCOUNTER — Encounter: Payer: Self-pay | Admitting: Student

## 2021-10-18 VITALS — BP 147/82 | HR 81 | Temp 98.2°F | Resp 17 | Ht 64.0 in | Wt 137.0 lb

## 2021-10-18 DIAGNOSIS — R0989 Other specified symptoms and signs involving the circulatory and respiratory systems: Secondary | ICD-10-CM

## 2021-10-18 DIAGNOSIS — I480 Paroxysmal atrial fibrillation: Secondary | ICD-10-CM

## 2021-10-18 DIAGNOSIS — I10 Essential (primary) hypertension: Secondary | ICD-10-CM

## 2021-10-18 NOTE — Progress Notes (Signed)
Primary Physician/Referring:  [facility]  Patient ID: Krista Carter, female    DOB: Jul 05, 1955, 66 y.o.   MRN: [id number]  Chief Complaint  Patient presents with   New Patient (Initial Visit)    1-2 WEEKS   Hypertension   HPI:    Krista Carter  is a 67 y.o. Caucasian female with history of hypertension, hyperlipidemia, prediabetes, 40-pack-year history (currently smoking 1.5 packs/day).  Patient is referred to our office for evaluation and management of atrial fibrillation noted during recent hospitalization.  Patient also admits to drinking 2-3 liquor beverages 2 to 3 days/week.  Patient admitted 10/07/2021 - 10/10/2021 with acute alcoholic pancreatitis.  During hospitalization patient presented in atrial fibrillation with RVR, which spontaneously converted to normal sinus rhythm within a few minutes in the emergency department.  She is now referred to our office for further evaluation and management of atrial fibrillation.  Patient reports she has slowly been recovering well since discharge.  Denies palpitations, chest pain, dizziness, syncope, near syncope.  She does have dyspnea on exertion which is chronic and stable.  Past Medical History:  Diagnosis Date   Adrenal adenoma 02/2015   left, seen on CT.    Anemia 07/2010, 04/2015   normocytic.    Depression with anxiety 2008   Diabetes mellitus 2011   type 2.    Duodenitis 2005   HH also seen on EGD.    Dyslipidemia 2011   Expressive aphasia 03/2010   subjective word finding difficulty and slurred speach.  MRI/MRA, CT scan: mild small vessel disease, mild narrowing bilateral int carotids and RCA   Hydronephrosis 2010   per ultrasound: mild, chronic, non-obstructing, bilateral.   Hypertension 2011   IBS (irritable bowel syndrome) 2008   diarrhea prone   Mitral valve regurgitation 03/2010   Mild MVR, EF 60-65% per echo.   Multiple gastric ulcers 09/2013   Benign, due to ASA powders.  H Pylori  negative.     Renal insufficiency 2014   AKI 10/2012 due to dehydration, acute (probably infectious/viral) colitis, ACE use.    Severe protein-calorie malnutrition ([place]) 10/2012   Tibia/fibula fracture 04/2015   left.  Dital tibia, proximal fibula.    Past Surgical History:  Procedure Laterality Date   ABDOMINAL HYSTERECTOMY     BONE MARROW BIOPSY  07/2010.    Benign fibroconnective and soft tissue with abundant acute and chronic inflammation, with foreign body multinucleated giant cell reaction. No atypia, no malignancy   colonoscopies  2008, 09/2013   both normal.    ESOPHAGOGASTRODUODENOSCOPY  2005, 09/2013, 10/2013   ESOPHAGOGASTRODUODENOSCOPY (EGD) WITH PROPOFOL N/A 04/13/2015   Procedure: ESOPHAGOGASTRODUODENOSCOPY (EGD) WITH PROPOFOL;  Surgeon: Jerene Bears, MD;  Location: [facility];  Service: Endoscopy;  Laterality: N/A;   ORIF NONUNION HUMERUS FRACTURE  07/2010   with bone graft.    Family History  Problem Relation Age of Onset   Stroke Mother    Kidney cancer Mother    Heart failure Father    Hypertension Father    Heart disease Father    Diabetes Father    Diabetes Sister    Hypertension Sister    Colon cancer Maternal Uncle     Social History   Tobacco Use   Smoking status: Every Day    Packs/day: 1.50    Years: 20.00    Total pack years: 30.00    Types: Cigarettes   Smokeless tobacco: Never  Substance Use Topics   Alcohol  use: Yes    Comment: "just socially"   Marital Status: Widowed   ROS  Review of Systems  Constitutional: Negative for malaise/fatigue and weight gain.  Cardiovascular:  Positive for dyspnea on exertion (chronic, stable). Negative for chest pain, claudication, leg swelling, near-syncope, orthopnea, palpitations, paroxysmal nocturnal dyspnea and syncope.  Neurological:  Negative for dizziness.   Objective  Blood pressure (!) 147/82, pulse 81, temperature 98.2 F (36.8 C), temperature source Temporal, resp. rate 17, height 5' 4"  (1.626 m),  weight 137 lb (62.1 kg), SpO2 98 %.     10/18/2021    9:36 AM 10/10/2021    7:30 AM 10/10/2021    5:01 AM  Vitals with BMI  Height 5' 4"     Weight 137 lbs  143 lbs 2 oz  BMI 32.3  55.73  Systolic 220 254 270  Diastolic 82 82 68  Pulse 81 65 68      Physical Exam Vitals reviewed.  Cardiovascular:     Rate and Rhythm: Normal rate and regular rhythm.     Pulses: Intact distal pulses.          Dorsalis pedis pulses are 1+ on the right side and 1+ on the left side.       Posterior tibial pulses are 1+ on the right side and 1+ on the left side.     Heart sounds: S1 normal and S2 normal. No murmur heard.    No gallop.  Pulmonary:     Effort: Pulmonary effort is normal. No respiratory distress.     Breath sounds: No wheezing, rhonchi or rales.  Musculoskeletal:     Right lower leg: No edema.     Left lower leg: No edema.  Neurological:     Mental Status: She is alert.     Laboratory examination:   Recent Labs    10/07/21 0549 10/08/21 0234 10/09/21 0330  NA 139 137 135  K 3.5 3.6 4.3  CL 101 107 103  CO2 24 22 24   GLUCOSE 103* 79 76  BUN 11 7* <5*  CREATININE 0.86 0.69 0.59  CALCIUM 9.6 8.2* 7.8*  GFRNONAA >60 >60 >60   estimated creatinine clearance is 61.3 mL/min (by C-G formula based on SCr of 0.59 mg/dL).     Latest Ref Rng & Units 10/09/2021    3:30 AM 10/08/2021    2:34 AM 10/07/2021    5:49 AM  CMP  Glucose 70 - 99 mg/dL 76  79  103   BUN 8 - 23 mg/dL <5  7  11    Creatinine 0.44 - 1.00 mg/dL 0.59  0.69  0.86   Sodium 135 - 145 mmol/L 135  137  139   Potassium 3.5 - 5.1 mmol/L 4.3  3.6  3.5   Chloride 98 - 111 mmol/L 103  107  101   CO2 22 - 32 mmol/L 24  22  24    Calcium 8.9 - 10.3 mg/dL 7.8  8.2  9.6   Total Protein 6.5 - 8.1 g/dL 5.0  5.7  6.7   Total Bilirubin 0.3 - 1.2 mg/dL 0.4  0.3  0.5   Alkaline Phos 38 - 126 U/L 71  82  102   AST 15 - 41 U/L 12  10  15    ALT 0 - 44 U/L 8  7  11        Latest Ref Rng & Units 10/08/2021    2:34 AM 10/07/2021     5:49 AM 07/31/2016  3:01 PM  CBC  WBC 4.0 - 10.5 K/uL 7.6  11.1  6.5   Hemoglobin 12.0 - 15.0 g/dL 12.8  14.5  12.6   Hematocrit 36.0 - 46.0 % 37.1  42.0  38.5   Platelets 150 - 400 K/uL 246  293  245     Lipid Panel Recent Labs    10/08/21 0234  CHOL 160  TRIG 110  LDLCALC 80  VLDL 22  HDL 58  CHOLHDL 2.8    HEMOGLOBIN A1C Lab Results  Component Value Date   HGBA1C 5.7 (H) 11/27/2013   MPG 117 (H) 11/27/2013   TSH Recent Labs    10/07/21 1416  TSH 0.928    External labs:  None   Allergies  No Known Allergies   Medications Prior to Visit:   Outpatient Medications Prior to Visit  Medication Sig Dispense Refill   aspirin EC 81 MG tablet Take 81 mg by mouth daily.       clonazePAM (KLONOPIN) 0.5 MG tablet Take 0.5 mg by mouth 2 (two) times daily as needed. Anxiety     lisinopril (PRINIVIL,ZESTRIL) 10 MG tablet Take 10 mg by mouth daily.     metoprolol succinate (TOPROL-XL) 100 MG 24 hr tablet Take 100 mg by mouth daily.     oxyCODONE (OXY IR/ROXICODONE) 5 MG immediate release tablet Take 1 tablet (5 mg total) by mouth every 6 (six) hours as needed for severe pain. 20 tablet 0   pantoprazole (PROTONIX) 40 MG tablet Take 1 tablet (40 mg total) by mouth 2 (two) times daily. 60 tablet 0   rosuvastatin (CRESTOR) 10 MG tablet Take 10 mg by mouth daily.     zolpidem (AMBIEN) 10 MG tablet Take 10 mg by mouth at bedtime as needed for sleep.     No facility-administered medications prior to visit.   Final Medications at End of Visit    Current Meds  Medication Sig   aspirin EC 81 MG tablet Take 81 mg by mouth daily.     clonazePAM (KLONOPIN) 0.5 MG tablet Take 0.5 mg by mouth 2 (two) times daily as needed. Anxiety   lisinopril (PRINIVIL,ZESTRIL) 10 MG tablet Take 10 mg by mouth daily.   metoprolol succinate (TOPROL-XL) 100 MG 24 hr tablet Take 100 mg by mouth daily.   oxyCODONE (OXY IR/ROXICODONE) 5 MG immediate release tablet Take 1 tablet (5 mg total) by mouth  every 6 (six) hours as needed for severe pain.   pantoprazole (PROTONIX) 40 MG tablet Take 1 tablet (40 mg total) by mouth 2 (two) times daily.   rosuvastatin (CRESTOR) 10 MG tablet Take 10 mg by mouth daily.   zolpidem (AMBIEN) 10 MG tablet Take 10 mg by mouth at bedtime as needed for sleep.   Radiology:   No results found.  Cardiac Studies:   Echocardiogram 10/07/2021: 1. Left ventricular ejection fraction, by estimation, is 60 to 65%. The left ventricle has normal function. The left ventricle has no regional wall motion abnormalities. Left ventricular diastolic parameters were normal.   2. Right ventricular systolic function is normal. The right ventricular size is normal. Tricuspid regurgitation signal is inadequate for assessing PA pressure.   3. The mitral valve is grossly normal. No evidence of mitral valve regurgitation. No evidence of mitral stenosis.   4. The aortic valve is tricuspid. There is mild calcification of the aortic valve. Aortic valve regurgitation is not visualized. Aortic valve sclerosis is present, with no evidence of aortic valve stenosis.  5. The inferior vena cava is normal in size with greater than 50% respiratory variability, suggesting right atrial pressure of 3 mmHg.   EKG:   10/18/2021: Sinus rhythm at a rate of 70 bpm.  Normal axis.  Nonspecific T wave abnormality.  No evidence of ischemia or underlying injury pattern.  Assessment     ICD-10-CM   1. Primary hypertension  I10 EKG 12-Lead    2. Paroxysmal atrial fibrillation (HCC)  I48.0 PCV MYOCARDIAL PERFUSION WO LEXISCAN    LONG TERM MONITOR (3-14 DAYS)    3. Decreased pedal pulses  R09.89 PCV LOWER ARTERIAL (BILATERAL)       There are no discontinued medications.  No orders of the defined types were placed in this encounter.   Recommendations:   Krista Carter is a 66 y.o. Caucasian female with history of hypertension, hyperlipidemia, prediabetes, 40-pack-year history (currently smoking 1.5  packs/day).  Patient is referred to our office for evaluation and management of atrial fibrillation noted during recent hospitalization.  Patient also admits to drinking 2-3 liquor beverages 2 to 3 days/week.  Patient admitted 10/07/2021 - 10/10/2021 with acute alcoholic pancreatitis.  During hospitalization patient presented in atrial fibrillation with RVR, which spontaneously converted to normal sinus rhythm within a few minutes in the emergency department.  She is now referred to our office for further evaluation and management of atrial fibrillation.  For multiple cardiovascular risk factors and longstanding dyspnea on exertion will obtain nuclear stress test.  Suspect patient's episode of atrial fibrillation to be related to acute illness at time of presentation to the hospital, therefore we will hold off on anticoagulation as discussed during admission.  However will obtain 2-week cardiac monitor to further evaluate for recurrence of atrial fibrillation.  On physical exam patient's pedal pulses are decreased, will therefore obtain lower arterial duplex/ABI as patient has risk factors for PAD.  Follow-up in 4 to 6 weeks, sooner if needed.   Krista Berthold, PA-C 10/18/2021, 1:07 PM Office: [phone]

## 2021-11-08 ENCOUNTER — Ambulatory Visit: Payer: BC Managed Care – PPO

## 2021-11-08 DIAGNOSIS — R0989 Other specified symptoms and signs involving the circulatory and respiratory systems: Secondary | ICD-10-CM

## 2021-11-13 ENCOUNTER — Other Ambulatory Visit: Payer: BC Managed Care – PPO

## 2021-11-13 NOTE — Progress Notes (Signed)
Both legs consistent with severe arterial calcification, if symptomatic, maybe can shockwave?

## 2021-11-15 ENCOUNTER — Ambulatory Visit: Payer: BC Managed Care – PPO

## 2021-11-15 DIAGNOSIS — I480 Paroxysmal atrial fibrillation: Secondary | ICD-10-CM

## 2021-11-16 NOTE — Progress Notes (Signed)
Can you please call pt and let them know test is normal. Thank you!

## 2021-11-17 NOTE — Progress Notes (Signed)
Spoke with patient advised her of message above. Patient did not have any questions.

## 2021-11-19 NOTE — Progress Notes (Signed)
Currently we could do that, patient has to be extremely symptomatic

## 2021-11-23 ENCOUNTER — Encounter: Payer: Self-pay | Admitting: Cardiology

## 2021-11-23 ENCOUNTER — Ambulatory Visit: Payer: BC Managed Care – PPO | Admitting: Cardiology

## 2021-11-23 VITALS — BP 140/87 | HR 80 | Temp 97.0°F | Resp 16 | Ht 64.0 in | Wt 137.2 lb

## 2021-11-23 DIAGNOSIS — I10 Essential (primary) hypertension: Secondary | ICD-10-CM

## 2021-11-23 DIAGNOSIS — F17210 Nicotine dependence, cigarettes, uncomplicated: Secondary | ICD-10-CM

## 2021-11-23 DIAGNOSIS — I4891 Unspecified atrial fibrillation: Secondary | ICD-10-CM

## 2021-11-23 DIAGNOSIS — E782 Mixed hyperlipidemia: Secondary | ICD-10-CM

## 2021-11-23 NOTE — Progress Notes (Signed)
ID:  Krista Carter, DOB 1955-05-03, MRN [id number]  PCP:  [facility]  Cardiologist:  Rex Kras, DO, [facility] (established care 10/18/2021)  Date: 11/23/21  Chief Complaint  Patient presents with   Atrial Fibrillation   Follow-up   Results    HPI  Krista Carter is a 66 y.o. Caucasian female whose past medical history and cardiovascular risk factors include: Alone atrial fibrillation, hypertension, hyperlipidemia, prediabetes, cigarette smoking.  She was referred to the practice after her hospitalization in July 2023 for atrial fibrillation management.  In July 2023 she was hospitalized for acute alcoholic pancreatitis and was noted to be in A-fib with RVR and spontaneously converted to normal sinus rhythm within few minutes during her ER stay.  During our office visit in July 2023 she was recommended to have a cardiac monitor to reevaluate the burden of A-fib.  The underlying rhythm was predominantly sinus without evidence of A-fib during the monitoring period.  Since last visit she also had a stress test which was reported to be low risk and lower extremity duplex which notes small vessel disease.   Unfortunately she continues to smoke 1.5 packs/day and occasional wine coolers.  She denies anginal discomfort or heart failure symptoms.  She has not had any reoccurrence of symptoms to suggest atrial fibrillation.  ALLERGIES: No Known Allergies  MEDICATION LIST PRIOR TO VISIT: Current Meds  Medication Sig   aspirin EC 81 MG tablet Take 81 mg by mouth daily.     clonazePAM (KLONOPIN) 0.5 MG tablet Take 0.5 mg by mouth 2 (two) times daily as needed. Anxiety   lisinopril (PRINIVIL,ZESTRIL) 10 MG tablet Take 10 mg by mouth daily.   metoprolol succinate (TOPROL-XL) 100 MG 24 hr tablet Take 100 mg by mouth daily.   oxyCODONE (OXY IR/ROXICODONE) 5 MG immediate release tablet Take 1 tablet (5 mg total) by mouth every 6 (six) hours as needed for severe pain.    pantoprazole (PROTONIX) 40 MG tablet Take 1 tablet (40 mg total) by mouth 2 (two) times daily.   rosuvastatin (CRESTOR) 10 MG tablet Take 10 mg by mouth daily.   zolpidem (AMBIEN) 10 MG tablet Take 10 mg by mouth at bedtime as needed for sleep.     PAST MEDICAL HISTORY: Past Medical History:  Diagnosis Date   Adrenal adenoma 02/2015   left, seen on CT.    Anemia 07/2010, 04/2015   normocytic.    Depression with anxiety 2008   Diabetes mellitus 2011   type 2.    Duodenitis 2005   HH also seen on EGD.    Dyslipidemia 2011   Expressive aphasia 03/2010   subjective word finding difficulty and slurred speach.  MRI/MRA, CT scan: mild small vessel disease, mild narrowing bilateral int carotids and RCA   Hydronephrosis 2010   per ultrasound: mild, chronic, non-obstructing, bilateral.   Hypertension 2011   IBS (irritable bowel syndrome) 2008   diarrhea prone   Mitral valve regurgitation 03/2010   Mild MVR, EF 60-65% per echo.   Multiple gastric ulcers 09/2013   Benign, due to ASA powders.  H Pylori negative.     Renal insufficiency 2014   AKI 10/2012 due to dehydration, acute (probably infectious/viral) colitis, ACE use.    Severe protein-calorie malnutrition ([place]) 10/2012   Tibia/fibula fracture 04/2015   left.  Dital tibia, proximal fibula.     PAST SURGICAL HISTORY: Past Surgical History:  Procedure Laterality Date   ABDOMINAL HYSTERECTOMY  BONE MARROW BIOPSY  07/2010.    Benign fibroconnective and soft tissue with abundant acute and chronic inflammation, with foreign body multinucleated giant cell reaction. No atypia, no malignancy   colonoscopies  2008, 09/2013   both normal.    ESOPHAGOGASTRODUODENOSCOPY  2005, 09/2013, 10/2013   ESOPHAGOGASTRODUODENOSCOPY (EGD) WITH PROPOFOL N/A 04/13/2015   Procedure: ESOPHAGOGASTRODUODENOSCOPY (EGD) WITH PROPOFOL;  Surgeon: Jerene Bears, MD;  Location: [facility];  Service: Endoscopy;  Laterality: N/A;   ORIF NONUNION HUMERUS FRACTURE  07/2010    with bone graft.     FAMILY HISTORY: The patient family history includes Colon cancer in her maternal uncle; Diabetes in her father and sister; Heart disease in her father; Heart failure in her father; Hypertension in her father and sister; Kidney cancer in her mother; Stroke in her mother.  SOCIAL HISTORY:  The patient  reports that she has been smoking cigarettes. She has a 30.00 pack-year smoking history. She has never used smokeless tobacco. She reports current alcohol use. She reports that she does not use drugs.  REVIEW OF SYSTEMS: Review of Systems  Cardiovascular:  Negative for chest pain, claudication, dyspnea on exertion, irregular heartbeat, leg swelling, near-syncope, orthopnea, palpitations, paroxysmal nocturnal dyspnea and syncope.  Respiratory:  Negative for shortness of breath.   Hematologic/Lymphatic: Negative for bleeding problem.  Musculoskeletal:  Negative for muscle cramps and myalgias.  Neurological:  Negative for dizziness and light-headedness.    PHYSICAL EXAM:    11/23/2021    2:32 PM 10/18/2021    9:36 AM 10/10/2021    7:30 AM  Vitals with BMI  Height 5' 4"  5' 4"    Weight 137 lbs 3 oz 137 lbs   BMI 33.54 56.2   Systolic 563 893 734  Diastolic 87 82 82  Pulse 80 81 65    Physical Exam  Constitutional: No distress.  Age appropriate, hemodynamically stable.   Neck: No JVD present.  Cardiovascular: Normal rate, regular rhythm, S1 normal, S2 normal and intact distal pulses. Exam reveals no gallop, no S3 and no S4.  No murmur heard. Pulses:      Dorsalis pedis pulses are 1+ on the right side and 1+ on the left side.       Posterior tibial pulses are 1+ on the right side and 1+ on the left side.  Pulmonary/Chest: Effort normal. No stridor. She has no wheezes. She has no rales.  Decreased breath sounds bilaterally.  Abdominal: Soft. Bowel sounds are normal. She exhibits no distension. There is no abdominal tenderness.  Musculoskeletal:        General:  No edema.     Cervical back: Neck supple.  Neurological: She is alert and oriented to person, place, and time. She has intact cranial nerves (2-12).  Skin: Skin is warm and moist.   CARDIAC DATABASE: EKG: 10/18/2021: Sinus rhythm at a rate of 70 bpm.  Normal axis.  Nonspecific T wave abnormality.  No evidence of ischemia or underlying injury pattern.  Echocardiogram: 10/07/2021: 1. Left ventricular ejection fraction, by estimation, is 60 to 65%. The left ventricle has normal function. The left ventricle has no regional wall motion abnormalities. Left ventricular diastolic parameters were normal.   2. Right ventricular systolic function is normal. The right ventricular size is normal. Tricuspid regurgitation signal is inadequate for assessing PA pressure.   3. The mitral valve is grossly normal. No evidence of mitral valve regurgitation. No evidence of mitral stenosis.   4. The aortic valve is tricuspid. There is mild  calcification of the aortic valve. Aortic valve regurgitation is not visualized. Aortic valve sclerosis is present, with no evidence of aortic valve stenosis.   5. The inferior vena cava is normal in size with greater than 50% respiratory variability, suggesting right atrial pressure of 3 mmHg.    Stress Testing: Exercise nuclear stress test 11/15/21 Myocardial perfusion is normal. Overall LV systolic function is normal without regional wall motion abnormalities. Stress LV EF: 63%. Low risk study. Normal ECG stress. The patient exercised for 4 minutes and 46 seconds of a Modified Bruce protocol, achieving approximately 6.77 METs and 110% MPHR. The heart rate response was normal. The blood pressure response was normal. No previous exam available for comparison.   Heart Catheterization: None  LE Arterial Duplex: 11/12/2021 Lower Extremity Arterial Duplex 11/08/2021:  No hemodynamically significant stenoses are identified in the right and left lower extremity arterial system.   There is mild disease in major vessels.  This exam reveals normal perfusion of the right lower extremity (ABI 1.67) with markedly abnormal monophasic waveform at the right ankle.    This exam reveals normal perfusion of the left lower extremity (ABI 1.50) with mildly abnormal biphasic waveform at the left ankle. Study suggests small vessel disease.   Zio patch extended outpatient telemetry for 5 days and 5 hours starting 10/18/2021: Minimum heart rate 56, maximum heart rate 156 bpm.  Predominant rhythm is sinus rhythm.  There were brief atrial tachycardia episodes x2, longest 6 seconds.   Occasional PACs and PVCs.   There were no patient triggered events.     LABORATORY DATA:    Latest Ref Rng & Units 10/08/2021    2:34 AM 10/07/2021    5:49 AM 07/31/2016    3:01 PM  CBC  WBC 4.0 - 10.5 K/uL 7.6  11.1  6.5   Hemoglobin 12.0 - 15.0 g/dL 12.8  14.5  12.6   Hematocrit 36.0 - 46.0 % 37.1  42.0  38.5   Platelets 150 - 400 K/uL 246  293  245        Latest Ref Rng & Units 10/09/2021    3:30 AM 10/08/2021    2:34 AM 10/07/2021    5:49 AM  CMP  Glucose 70 - 99 mg/dL 76  79  103   BUN 8 - 23 mg/dL <5  7  11    Creatinine 0.44 - 1.00 mg/dL 0.59  0.69  0.86   Sodium 135 - 145 mmol/L 135  137  139   Potassium 3.5 - 5.1 mmol/L 4.3  3.6  3.5   Chloride 98 - 111 mmol/L 103  107  101   CO2 22 - 32 mmol/L 24  22  24    Calcium 8.9 - 10.3 mg/dL 7.8  8.2  9.6   Total Protein 6.5 - 8.1 g/dL 5.0  5.7  6.7   Total Bilirubin 0.3 - 1.2 mg/dL 0.4  0.3  0.5   Alkaline Phos 38 - 126 U/L 71  82  102   AST 15 - 41 U/L 12  10  15    ALT 0 - 44 U/L 8  7  11      Lipid Panel     Component Value Date/Time   CHOL 160 10/08/2021 0234   TRIG 110 10/08/2021 0234   HDL 58 10/08/2021 0234   CHOLHDL 2.8 10/08/2021 0234   VLDL 22 10/08/2021 0234   LDLCALC 80 10/08/2021 0234    No components found for: "NTPROBNP" No results for input(s): "PROBNP"  in the last 8760 hours. Recent Labs    10/07/21 1416  TSH 0.928     BMP Recent Labs    10/07/21 0549 10/08/21 0234 10/09/21 0330  NA 139 137 135  K 3.5 3.6 4.3  CL 101 107 103  CO2 24 22 24   GLUCOSE 103* 79 76  BUN 11 7* <5*  CREATININE 0.86 0.69 0.59  CALCIUM 9.6 8.2* 7.8*  GFRNONAA >60 >60 >60    HEMOGLOBIN A1C Lab Results  Component Value Date   HGBA1C 5.7 (H) 11/27/2013   MPG 117 (H) 11/27/2013    IMPRESSION:    ICD-10-CM   1. Atrial fibrillation, transient ([place])  I48.91     2. Benign hypertension  I10     3. Mixed hyperlipidemia  E78.2     4. Cigarette smoker  F17.210        RECOMMENDATIONS: Krista Carter is a 66 y.o.  female whose past medical history and cardiac risk factors include: Alone atrial fibrillation, hypertension, hyperlipidemia, prediabetes, cigarette smoking.  Atrial fibrillation, transient ([place]) Consistent with a lone A-fib in the setting of dehydration/acute pancreatitis. Spontaneously converted to normal sinus rhythm during her ER stay. Remained in sinus rhythm during her hospitalization and also the cardiac monitor did not illustrate A-fib during the monitoring period. We discussed considering loop recorder implantation for long-term monitoring.  After discussing the risks, complications, limitations she would like to consider this and will call us back. Continue Toprol-XL for rate control strategy. Monitor for now  Benign hypertension Blood pressures are not optimized. I have asked her to keep a log of her blood pressures and to review with PCP  Mixed hyperlipidemia Continue statin therapy. LDL currently within acceptable range. Currently managed by primary care provider.  Cigarette smoker Tobacco cessation counseling: Currently smoking 1.5 packs/day   Patient is not willing to quit at this time. 5 mins were spent counseling patient cessation techniques. We discussed various methods to help quit smoking, including deciding on a date to quit, joining a support group, pharmacological agents-  nicotine gum/patch/lozenges.  I will reassess her progress at the next follow-up visit  Independently reviewed her most recent stress test with her at today's office visit.  She does not complain of claudication.  Lower extremity arterial duplex results reviewed as well.  Educated on importance of complete smoking cessation, aspirin, and statin therapy.   Like to see her back on an annual basis after her annual well visit or sooner if change in clinical status.   FINAL MEDICATION LIST END OF ENCOUNTER: No orders of the defined types were placed in this encounter.   There are no discontinued medications.   Current Outpatient Medications:    aspirin EC 81 MG tablet, Take 81 mg by mouth daily.  , Disp: , Rfl:    clonazePAM (KLONOPIN) 0.5 MG tablet, Take 0.5 mg by mouth 2 (two) times daily as needed. Anxiety, Disp: , Rfl:    lisinopril (PRINIVIL,ZESTRIL) 10 MG tablet, Take 10 mg by mouth daily., Disp: , Rfl:    metoprolol succinate (TOPROL-XL) 100 MG 24 hr tablet, Take 100 mg by mouth daily., Disp: , Rfl:    oxyCODONE (OXY IR/ROXICODONE) 5 MG immediate release tablet, Take 1 tablet (5 mg total) by mouth every 6 (six) hours as needed for severe pain., Disp: 20 tablet, Rfl: 0   pantoprazole (PROTONIX) 40 MG tablet, Take 1 tablet (40 mg total) by mouth 2 (two) times daily., Disp: 60 tablet, Rfl: 0   rosuvastatin (  CRESTOR) 10 MG tablet, Take 10 mg by mouth daily., Disp: , Rfl:    zolpidem (AMBIEN) 10 MG tablet, Take 10 mg by mouth at bedtime as needed for sleep., Disp: , Rfl:   No orders of the defined types were placed in this encounter.   There are no Patient Instructions on file for this visit.   --Continue cardiac medications as reconciled in final medication list. --Return in about 1 year (around 11/24/2022) for Follow up history of a lone A-fib. or sooner if needed. --Continue follow-up with your primary care physician regarding the management of your other chronic comorbid  conditions.  Patient's questions and concerns were addressed to her satisfaction. She voices understanding of the instructions provided during this encounter.   This note was created using a voice recognition software as a result there may be grammatical errors inadvertently enclosed that do not reflect the nature of this encounter. Every attempt is made to correct such errors.  Rex Kras, [place], [facility]  Pager: [phone] Office: [phone]

## 2021-12-05 ENCOUNTER — Encounter (HOSPITAL_COMMUNITY): Payer: Self-pay | Admitting: Emergency Medicine

## 2021-12-05 ENCOUNTER — Emergency Department (HOSPITAL_COMMUNITY)
Admission: EM | Admit: 2021-12-05 | Discharge: 2021-12-05 | Discharge status: Against Medical Advice | Payer: BC Managed Care – PPO | Attending: Emergency Medicine | Admitting: Emergency Medicine

## 2021-12-05 ENCOUNTER — Other Ambulatory Visit: Payer: Self-pay

## 2021-12-05 DIAGNOSIS — R350 Frequency of micturition: Secondary | ICD-10-CM | POA: Insufficient documentation

## 2021-12-05 DIAGNOSIS — R110 Nausea: Secondary | ICD-10-CM | POA: Insufficient documentation

## 2021-12-05 DIAGNOSIS — R197 Diarrhea, unspecified: Secondary | ICD-10-CM | POA: Insufficient documentation

## 2021-12-05 DIAGNOSIS — R1030 Lower abdominal pain, unspecified: Secondary | ICD-10-CM | POA: Diagnosis not present

## 2021-12-05 DIAGNOSIS — Z5321 Procedure and treatment not carried out due to patient leaving prior to being seen by health care provider: Secondary | ICD-10-CM | POA: Insufficient documentation

## 2021-12-05 DIAGNOSIS — R319 Hematuria, unspecified: Secondary | ICD-10-CM | POA: Insufficient documentation

## 2021-12-05 LAB — URINALYSIS, ROUTINE W REFLEX MICROSCOPIC
Bilirubin Urine: NEGATIVE
Glucose, UA: NEGATIVE mg/dL
Ketones, ur: NEGATIVE mg/dL
Nitrite: NEGATIVE
Protein, ur: NEGATIVE mg/dL
Specific Gravity, Urine: 1.005 (ref 1.005–1.030)
pH: 5 (ref 5.0–8.0)

## 2021-12-05 NOTE — ED Triage Notes (Signed)
Patient states "I have pancreatitis."  Patient c/o lower abdominal pain, frequent urination, and blood in urine. Patient also endorses nausea and diarrhea x 2 days.

## 2021-12-11 ENCOUNTER — Other Ambulatory Visit: Payer: Self-pay | Admitting: *Deleted

## 2021-12-11 DIAGNOSIS — M79605 Pain in left leg: Secondary | ICD-10-CM

## 2021-12-20 ENCOUNTER — Ambulatory Visit (HOSPITAL_COMMUNITY)
Admission: RE | Admit: 2021-12-20 | Discharge: 2021-12-20 | Disposition: A | Discharge status: Home / Self Care | Payer: BC Managed Care – PPO | Source: Ambulatory Visit | Attending: Vascular Surgery | Admitting: Vascular Surgery

## 2021-12-20 DIAGNOSIS — M79604 Pain in right leg: Secondary | ICD-10-CM | POA: Insufficient documentation

## 2021-12-20 DIAGNOSIS — M79605 Pain in left leg: Secondary | ICD-10-CM

## 2021-12-21 ENCOUNTER — Encounter: Payer: Self-pay | Admitting: Vascular Surgery

## 2021-12-21 ENCOUNTER — Ambulatory Visit (INDEPENDENT_AMBULATORY_CARE_PROVIDER_SITE_OTHER): Payer: BC Managed Care – PPO | Admitting: Vascular Surgery

## 2021-12-21 VITALS — BP 100/70 | HR 68 | Temp 98.2°F | Resp 20 | Ht 64.0 in | Wt 135.0 lb

## 2021-12-21 DIAGNOSIS — I70219 Atherosclerosis of native arteries of extremities with intermittent claudication, unspecified extremity: Secondary | ICD-10-CM | POA: Diagnosis not present

## 2021-12-21 NOTE — Progress Notes (Signed)
ASSESSMENT & PLAN   PERIPHERAL ARTERIAL DISEASE: I think this patient may have some mild infrainguinal arterial occlusive disease however I am not convinced that all of her symptoms can be attributed to this.  She gets pain in both legs simply with standing and does have some back issues.  I think some of her symptoms can be attributed to claudication.  We have discussed the importance of tobacco cessation.  I also encouraged her to walk is much as possible.  We also discussed the importance of nutrition.  I favor a largely plant-based diet.  If she developed worsening symptoms, rest pain, or nonhealing ulcer then we could consider arteriography.  However currently I would not recommend arteriography even that she has normal Doppler signals, normal ABIs and stable claudication.  I will be happy to see her back if her symptoms progress.  REASON FOR CONSULT:    Claudication.  The consult is requested by Garnet Sierras, NP.  HPI:   Krista Carter is a 66 y.o. female who is referred with claudication.  I have reviewed the records from the referring office.  The patient was seen on 12/05/2021.  At that time she was complaining of pain in both legs associated with ambulation.  On my history the patient does describe some pain in her legs associated with ambulation and relieved with rest.  Her symptoms are more significant on the left side.  She does get the symptoms however sometimes simply with standing.  She also has some back pain which is chronic.  She gets cramps in her feet at night but she has no history of rest pain or nonhealing ulcers.  Her risk factors for peripheral arterial disease include hypertension, hypercholesterolemia, and tobacco use.  She smokes 1-1/2 packs/day and smoking for over 20 years.  She denies any history of diabetes.  She denies any family history of premature cardiovascular disease.  Past Medical History:  Diagnosis Date   Adrenal adenoma 02/2015   left, seen on CT.     Anemia 07/2010, 04/2015   normocytic.    Depression with anxiety 2008   Diabetes mellitus 2011   type 2.    Duodenitis 2005   HH also seen on EGD.    Dyslipidemia 2011   Expressive aphasia 03/2010   subjective word finding difficulty and slurred speach.  MRI/MRA, CT scan: mild small vessel disease, mild narrowing bilateral int carotids and RCA   Hydronephrosis 2010   per ultrasound: mild, chronic, non-obstructing, bilateral.   Hypertension 2011   IBS (irritable bowel syndrome) 2008   diarrhea prone   Mitral valve regurgitation 03/2010   Mild MVR, EF 60-65% per echo.   Multiple gastric ulcers 09/2013   Benign, due to ASA powders.  H Pylori negative.     Renal insufficiency 2014   AKI 10/2012 due to dehydration, acute (probably infectious/viral) colitis, ACE use.    Severe protein-calorie malnutrition ([place]) 10/2012   Tibia/fibula fracture 04/2015   left.  Dital tibia, proximal fibula.     Family History  Problem Relation Age of Onset   Stroke Mother    Kidney cancer Mother    Heart failure Father    Hypertension Father    Heart disease Father    Diabetes Father    Diabetes Sister    Hypertension Sister    Colon cancer Maternal Uncle     SOCIAL HISTORY: Social History   Tobacco Use   Smoking status: Every Day    Packs/day: 1.50  Years: 20.00    Total pack years: 30.00    Types: Cigarettes   Smokeless tobacco: Never  Substance Use Topics   Alcohol use: Yes    Comment: "just socially"    No Known Allergies  Current Outpatient Medications  Medication Sig Dispense Refill   aspirin EC 81 MG tablet Take 81 mg by mouth daily.       clonazePAM (KLONOPIN) 0.5 MG tablet Take 0.5 mg by mouth 2 (two) times daily as needed. Anxiety     dicyclomine (BENTYL) 10 MG capsule Take 10 mg by mouth 4 (four) times daily.     gabapentin (NEURONTIN) 100 MG capsule Take 100 mg by mouth 3 (three) times daily.     lisinopril (PRINIVIL,ZESTRIL) 10 MG tablet Take 10 mg by mouth daily.      metoprolol succinate (TOPROL-XL) 100 MG 24 hr tablet Take 100 mg by mouth daily.     pantoprazole (PROTONIX) 40 MG tablet Take 1 tablet (40 mg total) by mouth 2 (two) times daily. 60 tablet 0   rosuvastatin (CRESTOR) 20 MG tablet Take 20 mg by mouth at bedtime.     zolpidem (AMBIEN) 5 MG tablet Take 5 mg by mouth at bedtime as needed.     No current facility-administered medications for this visit.    REVIEW OF SYSTEMS:  '[X]'$  denotes positive finding, '[ ]'$  denotes negative finding Cardiac  Comments:  Chest pain or chest pressure:    Shortness of breath upon exertion:    Short of breath when lying flat:    Irregular heart rhythm:        Vascular    Pain in calf, thigh, or hip brought on by ambulation: x   Pain in feet at night that wakes you up from your sleep:  x   Blood clot in your veins:    Leg swelling:         Pulmonary    Oxygen at home:    Productive cough:     Wheezing:         Neurologic    Sudden weakness in arms or legs:     Sudden numbness in arms or legs:     Sudden onset of difficulty speaking or slurred speech:    Temporary loss of vision in one eye:     Problems with dizziness:         Gastrointestinal    Blood in stool:     Vomited blood:         Genitourinary    Burning when urinating:     Blood in urine:        Psychiatric    Major depression:         Hematologic    Bleeding problems:    Problems with blood clotting too easily:        Skin    Rashes or ulcers: x       Constitutional    Fever or chills:    -  PHYSICAL EXAM:   Vitals:   12/21/21 1334  BP: 100/70  Pulse: 68  Resp: 20  Temp: 98.2 F (36.8 C)  SpO2: 96%  Weight: 135 lb (61.2 kg)  Height: '5\' 4"'$  (1.626 m)   Body mass index is 23.17 kg/m. GENERAL: The patient is a well-nourished female, in no acute distress. The vital signs are documented above. CARDIAC: There is a regular rate and rhythm.  VASCULAR: I do not detect carotid bruits. She has palpable femoral pulses.  I cannot palpate pedal pulses. I did listen myself with the Doppler however and she has biphasic signals in the dorsalis pedis and posterior tibial positions bilaterally. PULMONARY: There is good air exchange bilaterally without wheezing or rales. ABDOMEN: Soft and non-tender with normal pitched bowel sounds.  MUSCULOSKELETAL: There are no major deformities. NEUROLOGIC: No focal weakness or paresthesias are detected. SKIN: There are no ulcers or rashes noted. PSYCHIATRIC: The patient has a normal affect.  DATA:    ARTERIAL DOPPLER STUDY: I have independently interpreted her arterial Doppler study today.  On the right side there is a biphasic posterior tibial and dorsalis pedis signal.  ABIs 100%.  Toe pressures 85 mmHg.  On the left side there is a biphasic posterior tibial and dorsalis pedis signal.  ABI is 100%.  Toe pressure 71 mmHg.  Deitra Mayo [facility]

## 2022-03-09 ENCOUNTER — Encounter: Payer: Self-pay | Admitting: Internal Medicine

## 2022-04-27 ENCOUNTER — Encounter: Payer: Self-pay | Admitting: *Deleted

## 2022-05-16 ENCOUNTER — Ambulatory Visit: Payer: BC Managed Care – PPO | Admitting: Internal Medicine

## 2022-06-14 ENCOUNTER — Other Ambulatory Visit: Payer: Self-pay

## 2022-06-14 ENCOUNTER — Emergency Department (HOSPITAL_COMMUNITY)
Admission: EM | Admit: 2022-06-14 | Discharge: 2022-06-14 | Discharge status: Against Medical Advice | Payer: BC Managed Care – PPO | Attending: Emergency Medicine | Admitting: Emergency Medicine

## 2022-06-14 ENCOUNTER — Encounter (HOSPITAL_COMMUNITY): Payer: Self-pay

## 2022-06-14 DIAGNOSIS — R1013 Epigastric pain: Secondary | ICD-10-CM | POA: Insufficient documentation

## 2022-06-14 DIAGNOSIS — R197 Diarrhea, unspecified: Secondary | ICD-10-CM | POA: Diagnosis not present

## 2022-06-14 DIAGNOSIS — R110 Nausea: Secondary | ICD-10-CM | POA: Diagnosis not present

## 2022-06-14 DIAGNOSIS — Z5321 Procedure and treatment not carried out due to patient leaving prior to being seen by health care provider: Secondary | ICD-10-CM | POA: Insufficient documentation

## 2022-06-14 LAB — COMPREHENSIVE METABOLIC PANEL
ALT: 10 U/L (ref 0–44)
AST: 14 U/L — ABNORMAL LOW (ref 15–41)
Albumin: 3.8 g/dL (ref 3.5–5.0)
Alkaline Phosphatase: 100 U/L (ref 38–126)
Anion gap: 11 (ref 5–15)
BUN: 12 mg/dL (ref 8–23)
CO2: 25 mmol/L (ref 22–32)
Calcium: 9.3 mg/dL (ref 8.9–10.3)
Chloride: 97 mmol/L — ABNORMAL LOW (ref 98–111)
Creatinine, Ser: 0.64 mg/dL (ref 0.44–1.00)
GFR, Estimated: >60 mL/min (ref >60)
Glucose, Bld: 125 mg/dL — ABNORMAL HIGH (ref 70–99)
Potassium: 3.7 mmol/L (ref 3.5–5.1)
Sodium: 133 mmol/L — ABNORMAL LOW (ref 135–145)
Total Bilirubin: 0.3 mg/dL (ref 0.3–1.2)
Total Protein: 7.4 g/dL (ref 6.5–8.1)

## 2022-06-14 LAB — URINALYSIS, ROUTINE W REFLEX MICROSCOPIC
Bilirubin Urine: NEGATIVE
Glucose, UA: NEGATIVE mg/dL
Ketones, ur: NEGATIVE mg/dL
Leukocytes,Ua: NEGATIVE
Nitrite: NEGATIVE
Protein, ur: NEGATIVE mg/dL
Specific Gravity, Urine: <1.005 — ABNORMAL LOW (ref 1.005–1.030)
pH: 6 (ref 5.0–8.0)

## 2022-06-14 LAB — URINALYSIS, MICROSCOPIC (REFLEX)

## 2022-06-14 LAB — CBC
HCT: 41.2 % (ref 36.0–46.0)
Hemoglobin: 14 g/dL (ref 12.0–15.0)
MCH: 33 pg (ref 26.0–34.0)
MCHC: 34 g/dL (ref 30.0–36.0)
MCV: 97.2 fL (ref 80.0–100.0)
Platelets: 273 10*3/uL (ref 150–400)
RBC: 4.24 MIL/uL (ref 3.87–5.11)
RDW: 14.6 % (ref 11.5–15.5)
WBC: 7.7 10*3/uL (ref 4.0–10.5)
nRBC: 0 % (ref 0.0–0.2)

## 2022-06-14 LAB — LIPASE, BLOOD: Lipase: 116 U/L — ABNORMAL HIGH (ref 11–51)

## 2022-06-14 MED ORDER — FENTANYL CITRATE PF 50 MCG/ML IJ SOSY: 25.0000 ug | PREFILLED_SYRINGE | Freq: Once | INTRAMUSCULAR | Status: DC

## 2022-06-14 MED ORDER — SODIUM CHLORIDE 0.9 % IV BOLUS: 1000.0000 mL | Freq: Once | INTRAVENOUS | Status: DC

## 2022-06-14 NOTE — ED Notes (Signed)
Called x2 for treatment room no answer in the waiting room.

## 2022-06-14 NOTE — ED Provider Triage Note (Signed)
Emergency Medicine Provider Triage Evaluation Note  Krista Carter , a 67 y.o. female  was evaluated in triage.  Pt complains of epigastric abdominal pain that radiates to the back.  Patient does have a history of pancreatitis and states this feels similar.  She has been drinking heavily recently.  She typically drinks a pint of vodka a day.  Reports associated nausea and diarrhea without vomiting.  Review of Systems  Positive:  Negative: See above  Physical Exam  BP (!) 158/98 (BP Location: Right Arm)   Pulse 90   Temp 99.3 F (37.4 C) (Oral)   Resp 18   Ht '5\' 4"'$  (1.626 m)   Wt 62.1 kg   SpO2 98%   BMI 23.52 kg/m  Gen:   Awake, no distress   Resp:  Normal effort  MSK:   Moves extremities without difficulty  Other:  Epigastric abdominal tenderness  Medical Decision Making  Medically screening exam initiated at 6:12 PM.  Appropriate orders placed.  Krista Carter was informed that the remainder of the evaluation will be completed by another provider, this initial triage assessment does not replace that evaluation, and the importance of remaining in the ED until their evaluation is complete.     Myna Bright [place], [place] 06/14/22 [phone]

## 2022-06-14 NOTE — ED Triage Notes (Signed)
Abdominal pain for the last 2-3 days, nausea, diarrhea. Hx of pancreatitis and thinks it is another flare.

## 2022-06-20 ENCOUNTER — Ambulatory Visit (INDEPENDENT_AMBULATORY_CARE_PROVIDER_SITE_OTHER): Payer: BC Managed Care – PPO | Admitting: Family Medicine

## 2022-06-20 ENCOUNTER — Encounter: Payer: Self-pay | Admitting: Family Medicine

## 2022-06-20 VITALS — BP 142/88 | HR 96 | Ht 64.0 in | Wt 137.0 lb

## 2022-06-20 DIAGNOSIS — F5101 Primary insomnia: Secondary | ICD-10-CM

## 2022-06-20 DIAGNOSIS — Z1231 Encounter for screening mammogram for malignant neoplasm of breast: Secondary | ICD-10-CM

## 2022-06-20 DIAGNOSIS — E039 Hypothyroidism, unspecified: Secondary | ICD-10-CM

## 2022-06-20 DIAGNOSIS — R7301 Impaired fasting glucose: Secondary | ICD-10-CM

## 2022-06-20 DIAGNOSIS — G4700 Insomnia, unspecified: Secondary | ICD-10-CM | POA: Insufficient documentation

## 2022-06-20 DIAGNOSIS — E785 Hyperlipidemia, unspecified: Secondary | ICD-10-CM | POA: Diagnosis not present

## 2022-06-20 DIAGNOSIS — Z23 Encounter for immunization: Secondary | ICD-10-CM

## 2022-06-20 DIAGNOSIS — Z122 Encounter for screening for malignant neoplasm of respiratory organs: Secondary | ICD-10-CM

## 2022-06-20 DIAGNOSIS — I10 Essential (primary) hypertension: Secondary | ICD-10-CM | POA: Diagnosis not present

## 2022-06-20 DIAGNOSIS — N183 Chronic kidney disease, stage 3 unspecified: Secondary | ICD-10-CM | POA: Insufficient documentation

## 2022-06-20 DIAGNOSIS — Z1159 Encounter for screening for other viral diseases: Secondary | ICD-10-CM

## 2022-06-20 DIAGNOSIS — E7800 Pure hypercholesterolemia, unspecified: Secondary | ICD-10-CM | POA: Insufficient documentation

## 2022-06-20 MED ORDER — TRAZODONE HCL 50 MG PO TABS: 25.0000 mg | ORAL_TABLET | Freq: Every evening | ORAL | 3 refills | Status: DC | PRN

## 2022-06-20 MED ORDER — LISINOPRIL 10 MG PO TABS: 10.0000 mg | ORAL_TABLET | Freq: Every day | ORAL | 3 refills | Status: DC

## 2022-06-20 MED ORDER — METOPROLOL SUCCINATE ER 100 MG PO TB24: 100.0000 mg | ORAL_TABLET | Freq: Every day | ORAL | 3 refills | Status: DC

## 2022-06-20 NOTE — Progress Notes (Signed)
New Patient Office Visit   Subjective   Patient ID: Krista Carter, female    DOB: Jan 26, 1956  Age: 67 y.o. MRN: [id number]  CC:  Chief Complaint  Patient presents with   Establish Care    HPI KATHEE Carter 67 year old female, presents to establish care. She  has a past medical history of Acute alcoholic pancreatitis, Adrenal adenoma (02/2015), Anemia (07/2010, 04/2015), Atrial fibrillation ([place]), Depression with anxiety (2008), Diabetes mellitus (2011), Duodenitis (2005), Dyslipidemia (2011), Expressive aphasia (03/2010), Gastric ulcer, Hydronephrosis (2010), Hypertension (2011), IBS (irritable bowel syndrome) (2008), Mitral valve regurgitation (03/2010), Multiple gastric ulcers (09/2013), Pancreatitis, PVD (peripheral vascular disease) ([place]), Renal insufficiency (2014), Severe protein-calorie malnutrition ([place]) (10/2012), and Tibia/fibula fracture (04/2015).  Patient here for elevated blood pressure. She is not exercising and is not adherent to low salt diet.  Blood pressure is unknown if controlled at home. Patient denies cardiac symptoms chest pain, chest pressure/discomfort, dyspnea, lower extremity edema, palpitations, syncope, and tachypnea.  Cardiovascular risk factors: dyslipidemia, hypertension, sedentary lifestyle, and smoking/ tobacco exposure.   Insomnia Primary symptoms: sleep disturbance, difficulty falling asleep, frequent awakening.  Patient reports trouble sleeping occurs nightly and has been gradually worsening since onset. The symptoms are aggravated by anxiety, family issues and emotional upset. How many beverages per day that contain caffeine: 6 or more. Types of beverages patient drink is soda. The symptoms are relieved by darkened room and certain positions. Treatments tried: ambien 5 mg. The treatment provided no relief. Typical bedtime:  8-10 P.M..  How long after going to bed to you fall asleep: over an hour.   PMH includes: hypertension, family stress or anxiety.         Outpatient Encounter Medications as of 06/20/2022  Medication Sig   aspirin EC 81 MG tablet Take 81 mg by mouth daily.     clonazePAM (KLONOPIN) 0.5 MG tablet Take 0.5 mg by mouth 2 (two) times daily as needed. Anxiety   dicyclomine (BENTYL) 10 MG capsule Take 10 mg by mouth 4 (four) times daily.   gabapentin (NEURONTIN) 100 MG capsule Take 100 mg by mouth 3 (three) times daily.   pantoprazole (PROTONIX) 40 MG tablet Take 1 tablet (40 mg total) by mouth 2 (two) times daily.   rosuvastatin (CRESTOR) 20 MG tablet Take 20 mg by mouth at bedtime.   traZODone (DESYREL) 50 MG tablet Take 0.5 tablets (25 mg total) by mouth at bedtime as needed for sleep.   zolpidem (AMBIEN) 5 MG tablet Take 5 mg by mouth at bedtime as needed.   [DISCONTINUED] lisinopril (PRINIVIL,ZESTRIL) 10 MG tablet Take 10 mg by mouth daily.   [DISCONTINUED] metoprolol succinate (TOPROL-XL) 100 MG 24 hr tablet Take 100 mg by mouth daily.   lisinopril (ZESTRIL) 10 MG tablet Take 1 tablet (10 mg total) by mouth daily.   metoprolol succinate (TOPROL-XL) 100 MG 24 hr tablet Take 1 tablet (100 mg total) by mouth daily.   No facility-administered encounter medications on file as of 06/20/2022.    Past Surgical History:  Procedure Laterality Date   ABDOMINAL HYSTERECTOMY     BONE MARROW BIOPSY  07/2010.    Benign fibroconnective and soft tissue with abundant acute and chronic inflammation, with foreign body multinucleated giant cell reaction. No atypia, no malignancy   colonoscopies  2008, 09/2013   both normal.    ESOPHAGOGASTRODUODENOSCOPY  2005, 09/2013, 10/2013   ESOPHAGOGASTRODUODENOSCOPY (EGD) WITH PROPOFOL N/A 04/13/2015   Procedure: ESOPHAGOGASTRODUODENOSCOPY (EGD) WITH PROPOFOL;  Surgeon: Lajuan Lines  Pyrtle, MD;  Location: [facility];  Service: Endoscopy;  Laterality: N/A;   ORIF NONUNION HUMERUS FRACTURE  07/2010   with bone graft.     Review of Systems  Constitutional:  Negative for chills and fever.  Eyes:   Negative for double vision.  Respiratory:  Negative for shortness of breath.   Cardiovascular:  Negative for chest pain.  Gastrointestinal:  Negative for nausea and vomiting.  Genitourinary:  Negative for dysuria.  Neurological:  Negative for dizziness and headaches.  Psychiatric/Behavioral:  The patient has insomnia.       Objective    BP (!) 142/88   Pulse 96   Ht 5\' 4"  (1.626 m)   Wt 137 lb (62.1 kg)   SpO2 97%   BMI 23.52 kg/m   Physical Exam Cardiovascular:     Rate and Rhythm: Normal rate and regular rhythm.     Pulses: Normal pulses.  Pulmonary:     Effort: Pulmonary effort is normal.  Musculoskeletal:        General: Normal range of motion.  Skin:    General: Skin is warm and dry.     Capillary Refill: Capillary refill takes less than 2 seconds.  Neurological:     General: No focal deficit present.     Mental Status: She is alert.  Psychiatric:        Thought Content: Thought content normal.       Assessment & Plan:  Primary insomnia Assessment & Plan: Prescribed Trazodone 25 mg as initial therapy Explained to go to bed at the same time each night and get up at the same time each morning, including on the weekends. Make sure your bedroom is quiet, dark, relaxing, and at a comfortable temperature. Remove electronic devices, such as TVs, computers, and smart phones, from the bedroom. Decrease caffeine intake.  Orders: -     traZODone HCl; Take 0.5 tablets (25 mg total) by mouth at bedtime as needed for sleep.  Dispense: 30 tablet; Refill: 3  Need for Tdap vaccination -     Tdap vaccine greater than or equal to 7yo IM  Primary hypertension -     CBC with Differential/Platelet -     CMP14+EGFR -     Microalbumin / creatinine urine ratio -     Metoprolol Succinate ER; Take 1 tablet (100 mg total) by mouth daily.  Dispense: 30 tablet; Refill: 3 -     Lisinopril; Take 1 tablet (10 mg total) by mouth daily.  Dispense: 30 tablet; Refill: 3  Hyperlipidemia,  unspecified hyperlipidemia type -     Lipid panel  Hypothyroidism, unspecified type -     TSH + free T4  Need for hepatitis C screening test -     Hepatitis C antibody  Encounter for screening mammogram for malignant neoplasm of breast -     Digital Screening Mammogram, Left and Right; Future  Screening for lung cancer -     Ambulatory Referral for Lung Cancer Scre  IFG (impaired fasting glucose) -     Hemoglobin A1c  Essential hypertension Assessment & Plan: Vitals:   06/20/22 1423 06/20/22 1429  BP: (!) 165/89 (!) 142/88   Patient reported not taking blood pressure medications this morning Patient Blood pressure medications includes lisinopril 10 mg, metoprolol 100 mg daily- Advise to take blood pressure medication daily     Explained non pharmacological interventions such as low salt, DASH diet discussed. Educated on stress reduction and physical activity. Discussed signs and symptoms  of major cardiovascular event and need to present to the ED. Follow up in 2 weeks with blood pressure reading logs. Patient verbalizes understanding regarding plan of care and all questions answered.      Return in about 2 weeks (around 07/04/2022) for re-check blood pressure, hypertension.   Renard Hamper Ria Comment, FNP

## 2022-06-20 NOTE — Assessment & Plan Note (Signed)
Vitals:   06/20/22 1423 06/20/22 1429  BP: (!) 165/89 (!) 142/88   Patient reported not taking blood pressure medications this morning Patient Blood pressure medications includes lisinopril 10 mg, metoprolol 100 mg daily- Advise to take blood pressure medication daily     Explained non pharmacological interventions such as low salt, DASH diet discussed. Educated on stress reduction and physical activity. Discussed signs and symptoms of major cardiovascular event and need to present to the ED. Follow up in 2 weeks with blood pressure reading logs. Patient verbalizes understanding regarding plan of care and all questions answered.

## 2022-06-20 NOTE — Assessment & Plan Note (Addendum)
Prescribed Trazodone 25 mg as initial therapy Explained to go to bed at the same time each night and get up at the same time each morning, including on the weekends. Make sure your bedroom is quiet, dark, relaxing, and at a comfortable temperature. Remove electronic devices, such as TVs, computers, and smart phones, from the bedroom. Decrease caffeine intake.

## 2022-06-20 NOTE — Patient Instructions (Signed)
It was pleasure meeting with you today. Please take medications as prescribed. Follow up with your primary health provider if any health concerns arises.  

## 2022-06-27 ENCOUNTER — Other Ambulatory Visit: Payer: Self-pay | Admitting: Family Medicine

## 2022-06-27 MED ORDER — ROSUVASTATIN CALCIUM 20 MG PO TABS: 20.0000 mg | ORAL_TABLET | Freq: Every day | ORAL | 1 refills | Status: DC

## 2022-06-28 LAB — CMP14+EGFR
ALT: 10 IU/L (ref 0–32)
AST: 10 IU/L (ref 0–40)
Albumin/Globulin Ratio: 1.6 (ref 1.2–2.2)
Albumin: 4 g/dL (ref 3.9–4.9)
Alkaline Phosphatase: 122 IU/L — ABNORMAL HIGH (ref 44–121)
BUN/Creatinine Ratio: 16 (ref 12–28)
BUN: 11 mg/dL (ref 8–27)
Bilirubin Total: 0.2 mg/dL (ref 0.0–1.2)
CO2: 22 mmol/L (ref 20–29)
Calcium: 9.5 mg/dL (ref 8.7–10.3)
Chloride: 102 mmol/L (ref 96–106)
Creatinine, Ser: 0.7 mg/dL (ref 0.57–1.00)
Globulin, Total: 2.5 g/dL (ref 1.5–4.5)
Glucose: 109 mg/dL — ABNORMAL HIGH (ref 70–99)
Potassium: 4.7 mmol/L (ref 3.5–5.2)
Sodium: 142 mmol/L (ref 134–144)
Total Protein: 6.5 g/dL (ref 6.0–8.5)
eGFR: 97 mL/min/{1.73_m2} (ref >59)

## 2022-06-28 LAB — LIPID PANEL
Chol/HDL Ratio: 3.3 ratio (ref 0.0–4.4)
Cholesterol, Total: 195 mg/dL (ref 100–199)
HDL: 59 mg/dL (ref >39)
LDL Chol Calc (NIH): 116 mg/dL — ABNORMAL HIGH (ref 0–99)
Triglycerides: 115 mg/dL (ref 0–149)
VLDL Cholesterol Cal: 20 mg/dL (ref 5–40)

## 2022-06-28 LAB — CBC WITH DIFFERENTIAL/PLATELET
Basophils Absolute: 0.1 10*3/uL (ref 0.0–0.2)
Basos: 1 %
EOS (ABSOLUTE): 0.2 10*3/uL (ref 0.0–0.4)
Eos: 3 %
Hematocrit: 39.5 % (ref 34.0–46.6)
Hemoglobin: 13.4 g/dL (ref 11.1–15.9)
Immature Grans (Abs): 0 10*3/uL (ref 0.0–0.1)
Immature Granulocytes: 0 %
Lymphocytes Absolute: 1.9 10*3/uL (ref 0.7–3.1)
Lymphs: 31 %
MCH: 32.1 pg (ref 26.6–33.0)
MCHC: 33.9 g/dL (ref 31.5–35.7)
MCV: 95 fL (ref 79–97)
Monocytes Absolute: 0.5 10*3/uL (ref 0.1–0.9)
Monocytes: 7 %
Neutrophils Absolute: 3.6 10*3/uL (ref 1.4–7.0)
Neutrophils: 58 %
Platelets: 209 10*3/uL (ref 150–450)
RBC: 4.17 x10E6/uL (ref 3.77–5.28)
RDW: 13.8 % (ref 11.7–15.4)
WBC: 6.3 10*3/uL (ref 3.4–10.8)

## 2022-06-28 LAB — MICROALBUMIN / CREATININE URINE RATIO
Creatinine, Urine: 95.5 mg/dL
Microalb/Creat Ratio: 5 mg/g creat (ref 0–29)
Microalbumin, Urine: 4.9 ug/mL

## 2022-06-28 LAB — HEMOGLOBIN A1C
Est. average glucose Bld gHb Est-mCnc: 131 mg/dL
Hgb A1c MFr Bld: 6.2 % — ABNORMAL HIGH (ref 4.8–5.6)

## 2022-06-28 LAB — HEPATITIS C ANTIBODY: Hep C Virus Ab: NONREACTIVE

## 2022-06-28 LAB — TSH+FREE T4
Free T4: 1.04 ng/dL (ref 0.82–1.77)
TSH: 1.44 u[IU]/mL (ref 0.450–4.500)

## 2022-07-04 ENCOUNTER — Ambulatory Visit: Payer: BC Managed Care – PPO | Admitting: Family Medicine

## 2022-07-09 ENCOUNTER — Encounter: Payer: Self-pay | Admitting: Family Medicine

## 2022-07-09 ENCOUNTER — Ambulatory Visit (INDEPENDENT_AMBULATORY_CARE_PROVIDER_SITE_OTHER): Payer: BC Managed Care – PPO | Admitting: Family Medicine

## 2022-07-09 VITALS — BP 113/69 | HR 61 | Ht 64.0 in | Wt 138.0 lb

## 2022-07-09 DIAGNOSIS — E569 Vitamin deficiency, unspecified: Secondary | ICD-10-CM | POA: Diagnosis not present

## 2022-07-09 DIAGNOSIS — I10 Essential (primary) hypertension: Secondary | ICD-10-CM

## 2022-07-09 DIAGNOSIS — R519 Headache, unspecified: Secondary | ICD-10-CM | POA: Insufficient documentation

## 2022-07-09 DIAGNOSIS — M79605 Pain in left leg: Secondary | ICD-10-CM

## 2022-07-09 DIAGNOSIS — J309 Allergic rhinitis, unspecified: Secondary | ICD-10-CM | POA: Insufficient documentation

## 2022-07-09 DIAGNOSIS — M79604 Pain in right leg: Secondary | ICD-10-CM | POA: Diagnosis not present

## 2022-07-09 DIAGNOSIS — M79606 Pain in leg, unspecified: Secondary | ICD-10-CM | POA: Insufficient documentation

## 2022-07-09 DIAGNOSIS — J45909 Unspecified asthma, uncomplicated: Secondary | ICD-10-CM | POA: Insufficient documentation

## 2022-07-09 DIAGNOSIS — F419 Anxiety disorder, unspecified: Secondary | ICD-10-CM

## 2022-07-09 DIAGNOSIS — F411 Generalized anxiety disorder: Secondary | ICD-10-CM | POA: Insufficient documentation

## 2022-07-09 DIAGNOSIS — M1990 Unspecified osteoarthritis, unspecified site: Secondary | ICD-10-CM | POA: Insufficient documentation

## 2022-07-09 DIAGNOSIS — M1710 Unilateral primary osteoarthritis, unspecified knee: Secondary | ICD-10-CM | POA: Insufficient documentation

## 2022-07-09 HISTORY — DX: Unspecified asthma, uncomplicated: J45.909

## 2022-07-09 MED ORDER — CYCLOBENZAPRINE HCL 5 MG PO TABS: 5.0000 mg | ORAL_TABLET | Freq: Three times a day (TID) | ORAL | 1 refills | Status: DC | PRN

## 2022-07-09 MED ORDER — HYDROXYZINE PAMOATE 25 MG PO CAPS: 25.0000 mg | ORAL_CAPSULE | Freq: Three times a day (TID) | ORAL | 1 refills | Status: DC | PRN

## 2022-07-09 NOTE — Assessment & Plan Note (Signed)
Vitals:   07/09/22 0355 07/09/22 1544  BP: 120/73 113/69    Blood pressure controlled in today's visit Continue Lisinopril 10 mg and metoprolol 100 mg daily

## 2022-07-09 NOTE — Patient Instructions (Signed)
It was pleasure meeting with you today. Please take medications as prescribed. Follow up with your primary health provider if any health concerns arises. If symptoms worsen please contact your primary care provider and/or visit the emergency department.  

## 2022-07-09 NOTE — Progress Notes (Signed)
Patient Office Visit   Subjective   Patient ID: Krista Carter, female    DOB: May 08, 1955  Age: 67 y.o. MRN: [id number]  CC:  Chief Complaint  Patient presents with   Hypertension    Patient is here for HTN f/u. Does not have a BP cuff at home.     HPI Krista CenterSusan H Greis 67 year old female, presents to the clinic for anxiety and bilateral leg pain. She  has a past medical history of Acute alcoholic pancreatitis, Adrenal adenoma (02/2015), Anemia (07/2010, 04/2015), Asthma, extrinsic, without status asthmaticus (07/09/2022), Atrial fibrillation, Depression with anxiety (2008), Diabetes mellitus (2011), Duodenitis (2005), Dyslipidemia (2011), Expressive aphasia (03/2010), Gastric ulcer, Hydronephrosis (2010), Hypertension (2011), IBS (irritable bowel syndrome) (2008), Mitral valve regurgitation (03/2010), Multiple gastric ulcers (09/2013), Pancreatitis, PVD (peripheral vascular disease), Renal insufficiency (2014), Severe protein-calorie malnutrition (10/2012), and Tibia/fibula fracture (04/2015).  Anxiety Presents for initial visit. The problem has been gradually worsening. Symptoms include decreased concentration, excessive worry, insomnia, irritability, nervous/anxious behavior and restlessness. Patient reports no chest pain, dizziness or shortness of breath. Symptoms occur occasionally. The severity of symptoms is causing significant distress. The symptoms are aggravated by family issues and social activities. Nighttime awakenings: several.  Risk factors include family history, prior traumatic experience and a major life event. Her past medical history is significant for anxiety/panic attacks. Past treatments include lifestyle changes and benzodiazephines. The treatment provided mild relief. Compliance with prior treatments has been good.    Leg Pain  There was no injury mechanism. The pain is present in the left leg and right leg. The quality of the pain is described as aching, shooting and  stabbing. The pain is at a severity of 10/10. The pain has been Constant since onset. Associated symptoms include muscle weakness. Pertinent negatives include no inability to bear weight, loss of motion, loss of sensation, numbness or tingling. She reports no foreign bodies present. The symptoms are aggravated by movement. She has tried NSAIDs and acetaminophen for the symptoms. The treatment provided no relief.        Outpatient Encounter Medications as of 07/09/2022  Medication Sig   aspirin EC 81 MG tablet Take 81 mg by mouth daily.     clonazePAM (KLONOPIN) 0.5 MG tablet Take 0.5 mg by mouth 2 (two) times daily as needed. Anxiety   cyclobenzaprine (FLEXERIL) 5 MG tablet Take 1 tablet (5 mg total) by mouth 3 (three) times daily as needed for muscle spasms.   dicyclomine (BENTYL) 10 MG capsule Take 10 mg by mouth 4 (four) times daily.   gabapentin (NEURONTIN) 100 MG capsule Take 100 mg by mouth 3 (three) times daily.   hydrOXYzine (VISTARIL) 25 MG capsule Take 1 capsule (25 mg total) by mouth every 8 (eight) hours as needed.   lisinopril (ZESTRIL) 10 MG tablet Take 1 tablet (10 mg total) by mouth daily.   metoprolol succinate (TOPROL-XL) 100 MG 24 hr tablet Take 1 tablet (100 mg total) by mouth daily.   pantoprazole (PROTONIX) 40 MG tablet Take 1 tablet (40 mg total) by mouth 2 (two) times daily.   rosuvastatin (CRESTOR) 20 MG tablet Take 1 tablet (20 mg total) by mouth at bedtime.   traZODone (DESYREL) 50 MG tablet Take 0.5 tablets (25 mg total) by mouth at bedtime as needed for sleep.   zolpidem (AMBIEN) 5 MG tablet Take 5 mg by mouth at bedtime as needed.   No facility-administered encounter medications on file as of 07/09/2022.    Past Surgical  History:  Procedure Laterality Date   ABDOMINAL HYSTERECTOMY     BONE MARROW BIOPSY  07/2010.    Benign fibroconnective and soft tissue with abundant acute and chronic inflammation, with foreign body multinucleated giant cell reaction. No atypia,  no malignancy   colonoscopies  2008, 09/2013   both normal.    ESOPHAGOGASTRODUODENOSCOPY  2005, 09/2013, 10/2013   ESOPHAGOGASTRODUODENOSCOPY (EGD) WITH PROPOFOL N/A 04/13/2015   Procedure: ESOPHAGOGASTRODUODENOSCOPY (EGD) WITH PROPOFOL;  Surgeon: Beverley Fiedler, MD;  Location: [facility];  Service: Endoscopy;  Laterality: N/A;   ORIF NONUNION HUMERUS FRACTURE  07/2010   with bone graft.     Review of Systems  Constitutional:  Negative for chills and fever.  HENT:  Negative for ear pain and hearing loss.   Eyes:  Negative for blurred vision.  Respiratory:  Negative for cough and shortness of breath.   Cardiovascular:  Negative for chest pain.  Gastrointestinal:  Negative for heartburn.  Genitourinary:  Negative for dysuria and urgency.  Musculoskeletal:  Positive for myalgias. Negative for falls.  Neurological:  Negative for dizziness and headaches.  Psychiatric/Behavioral:  The patient is nervous/anxious and has insomnia.       Objective    BP 113/69   Pulse 61   Ht 5\' 4"  (1.626 m)   Wt 138 lb (62.6 kg)   SpO2 97%   BMI 23.69 kg/m   Physical Exam Vitals reviewed.  Constitutional:      General: She is not in acute distress.    Appearance: Normal appearance. She is not ill-appearing, toxic-appearing or diaphoretic.  Eyes:     General:        Right eye: No discharge.        Left eye: No discharge.     Conjunctiva/sclera: Conjunctivae normal.  Cardiovascular:     Rate and Rhythm: Regular rhythm.     Heart sounds: Normal heart sounds.  Pulmonary:     Effort: Pulmonary effort is normal. No respiratory distress.     Breath sounds: Normal breath sounds.  Musculoskeletal:        General: Normal range of motion.     Cervical back: Normal range of motion.  Skin:    General: Skin is warm and dry.  Neurological:     General: No focal deficit present.     Mental Status: She is alert and oriented to person, place, and time. Mental status is at baseline.  Psychiatric:         Mood and Affect: Mood normal.        Behavior: Behavior normal.        Thought Content: Thought content normal.        Judgment: Judgment normal.       Assessment & Plan:  Anxiety Assessment & Plan:    07/09/2022    3:45 PM 06/20/2022    2:25 PM  GAD 7 : Generalized Anxiety Score  Nervous, Anxious, on Edge 2 1  Control/stop worrying 2 1  Worry too much - different things 2 3  Trouble relaxing 2 3  Restless 2 0  Easily annoyed or irritable 0 1  Afraid - awful might happen 0 2  Total GAD 7 Score 10 11  Anxiety Difficulty Not difficult at all Not difficult at all  Trial hydroxyzine 25 mg PRN Discussed about cognitive behavioral therapy focusing on thoughts, belief, and attitudes that affects feelings and behavior, learning about coping skills to deal with certain problems. Maintaining a consistent routine and schedule, Practice  stress management and self calming techniques, excersise regularly and spend time outdoors, Do not eat food that are high in fat, added sugar, or salt.  Referral to behavior health    Orders: -     hydrOXYzine Pamoate; Take 1 capsule (25 mg total) by mouth every 8 (eight) hours as needed.  Dispense: 30 capsule; Refill: 1  Vitamin deficiency -     VITAMIN D 25 Hydroxy (Vit-D Deficiency, Fractures)  Pain in both lower extremities Assessment & Plan: Flexeril 5 mg Discussed medication desired effects, potential side effects. Non pharmacological interventions include rest, avoid twisting, improper bending, straining lower back. Demonstration of proper body mechanics. Alternate ice and heat. Recommend stretching back and legs. Follow up for worsening or persistent symptoms. Patient verbalizes understanding regarding plan of care and all questions answered.   Orders: -     Cyclobenzaprine HCl; Take 1 tablet (5 mg total) by mouth 3 (three) times daily as needed for muscle spasms.  Dispense: 30 tablet; Refill: 1  Essential hypertension Assessment &  Plan: Vitals:   07/09/22 0355 07/09/22 1544  BP: 120/73 113/69    Blood pressure controlled in today's visit Continue Lisinopril 10 mg and metoprolol 100 mg daily     Return in about 3 months (around 10/08/2022) for chronic follow-up, hypertension, hyperlipidemia/ high cholestrol.   Cruzita Lederer Newman Nip, FNP

## 2022-07-09 NOTE — Assessment & Plan Note (Signed)
Flexeril 5 mg Discussed medication desired effects, potential side effects. Non pharmacological interventions include rest, avoid twisting, improper bending, straining lower back. Demonstration of proper body mechanics. Alternate ice and heat. Recommend stretching back and legs. Follow up for worsening or persistent symptoms. Patient verbalizes understanding regarding plan of care and all questions answered.

## 2022-07-09 NOTE — Assessment & Plan Note (Signed)
    07/09/2022    3:45 PM 06/20/2022    2:25 PM  GAD 7 : Generalized Anxiety Score  Nervous, Anxious, on Edge 2 1  Control/stop worrying 2 1  Worry too much - different things 2 3  Trouble relaxing 2 3  Restless 2 0  Easily annoyed or irritable 0 1  Afraid - awful might happen 0 2  Total GAD 7 Score 10 11  Anxiety Difficulty Not difficult at all Not difficult at all  Trial hydroxyzine 25 mg PRN Discussed about cognitive behavioral therapy focusing on thoughts, belief, and attitudes that affects feelings and behavior, learning about coping skills to deal with certain problems. Maintaining a consistent routine and schedule, Practice stress management and self calming techniques, excersise regularly and spend time outdoors, Do not eat food that are high in fat, added sugar, or salt.  Referral to behavior health

## 2022-07-10 ENCOUNTER — Other Ambulatory Visit: Payer: Self-pay

## 2022-07-10 ENCOUNTER — Telehealth: Payer: Self-pay | Admitting: Family Medicine

## 2022-07-10 DIAGNOSIS — E7800 Pure hypercholesterolemia, unspecified: Secondary | ICD-10-CM

## 2022-07-10 MED ORDER — ROSUVASTATIN CALCIUM 20 MG PO TABS: 20.0000 mg | ORAL_TABLET | Freq: Every day | ORAL | 1 refills | Status: DC

## 2022-07-10 NOTE — Telephone Encounter (Signed)
Prescription Request  07/10/2022  LOV: 07/09/2022  What is the name of the medication or equipment? Cholesterol  medicine   Have you contacted your pharmacy to request a refill? Yes   Which pharmacy would you like this sent to?  [id number] - [place], [place] - [address] AT [facility] [address] [place] [address] Phone: [phone] Fax: [phone]    Patient notified that their request is being sent to the clinical staff for review and that they should receive a response within 2 business days.   Please advise at Mobile [phone] (mobile)

## 2022-07-10 NOTE — Telephone Encounter (Signed)
Refill sent.

## 2022-08-10 ENCOUNTER — Ambulatory Visit
Admission: RE | Admit: 2022-08-10 | Discharge: 2022-08-10 | Disposition: A | Discharge status: Home / Self Care | Payer: Medicare PPO | Source: Ambulatory Visit | Attending: Family Medicine | Admitting: Family Medicine

## 2022-08-10 DIAGNOSIS — Z1231 Encounter for screening mammogram for malignant neoplasm of breast: Secondary | ICD-10-CM | POA: Diagnosis not present

## 2022-08-15 ENCOUNTER — Other Ambulatory Visit: Payer: Self-pay | Admitting: Family Medicine

## 2022-08-15 DIAGNOSIS — F5101 Primary insomnia: Secondary | ICD-10-CM

## 2022-08-15 DIAGNOSIS — M79604 Pain in right leg: Secondary | ICD-10-CM

## 2022-08-15 NOTE — Telephone Encounter (Signed)
traZODone (DESYREL) 50 MG tablet [[id number]]  clonazePAM (KLONOPIN) 0.5 MG tablet  cyclobenzaprine (FLEXERIL) 5 MG tablet      Sent to [facility] in [place].

## 2022-08-16 ENCOUNTER — Other Ambulatory Visit: Payer: Self-pay | Admitting: Family Medicine

## 2022-08-16 DIAGNOSIS — F5101 Primary insomnia: Secondary | ICD-10-CM

## 2022-08-16 MED ORDER — TRAZODONE HCL 50 MG PO TABS: 25.0000 mg | ORAL_TABLET | Freq: Every evening | ORAL | 3 refills | Status: DC | PRN

## 2022-08-16 MED ORDER — CYCLOBENZAPRINE HCL 5 MG PO TABS: 5.0000 mg | ORAL_TABLET | Freq: Three times a day (TID) | ORAL | 1 refills | Status: DC | PRN

## 2022-08-16 NOTE — Telephone Encounter (Signed)
Patient called these were sent to the wrong Pharmacy.  Needs to go to [facility]

## 2022-08-17 MED ORDER — CLONAZEPAM 0.5 MG PO TABS: 0.5000 mg | ORAL_TABLET | Freq: Two times a day (BID) | ORAL | 1 refills | Status: DC | PRN

## 2022-08-17 MED ORDER — CLONAZEPAM 0.5 MG PO TABS: 0.5000 mg | ORAL_TABLET | Freq: Two times a day (BID) | ORAL | 0 refills | Status: DC | PRN

## 2022-08-17 NOTE — Telephone Encounter (Signed)
sent 

## 2022-08-17 NOTE — Telephone Encounter (Signed)
Pt called again and stated her Clonazepam was never sent to the [facility] pharmacy in [place]. That's the only one she's missing. Stated the phar needs a new prescription because they have never filled rx.

## 2022-09-04 ENCOUNTER — Other Ambulatory Visit: Payer: Self-pay

## 2022-09-04 ENCOUNTER — Telehealth: Payer: Self-pay | Admitting: Family Medicine

## 2022-09-04 DIAGNOSIS — E7800 Pure hypercholesterolemia, unspecified: Secondary | ICD-10-CM

## 2022-09-04 MED ORDER — ROSUVASTATIN CALCIUM 20 MG PO TABS: 20.0000 mg | ORAL_TABLET | Freq: Every day | ORAL | 1 refills | Status: DC

## 2022-09-04 NOTE — Telephone Encounter (Signed)
Refills sent to pharmacy. 

## 2022-09-04 NOTE — Telephone Encounter (Signed)
Pt called and requested refills.  rosuvastatin (CRESTOR) 20 MG tablet [[id number]]

## 2022-10-08 ENCOUNTER — Ambulatory Visit: Payer: BC Managed Care – PPO | Admitting: Family Medicine

## 2022-10-19 ENCOUNTER — Other Ambulatory Visit: Payer: Self-pay | Admitting: Family Medicine

## 2022-10-22 ENCOUNTER — Ambulatory Visit: Payer: BC Managed Care – PPO | Admitting: Family Medicine

## 2022-10-25 ENCOUNTER — Encounter: Payer: Self-pay | Admitting: Emergency Medicine

## 2022-11-23 ENCOUNTER — Ambulatory Visit: Payer: BC Managed Care – PPO | Admitting: Cardiology

## 2022-11-23 ENCOUNTER — Other Ambulatory Visit: Payer: Self-pay | Admitting: Family Medicine

## 2022-12-28 ENCOUNTER — Ambulatory Visit: Payer: BC Managed Care – PPO | Admitting: Family Medicine

## 2023-01-03 ENCOUNTER — Other Ambulatory Visit: Payer: Self-pay

## 2023-01-03 DIAGNOSIS — I10 Essential (primary) hypertension: Secondary | ICD-10-CM

## 2023-01-03 MED ORDER — LISINOPRIL 10 MG PO TABS: 10.0000 mg | ORAL_TABLET | Freq: Every day | ORAL | 3 refills | Status: DC

## 2023-01-04 ENCOUNTER — Other Ambulatory Visit: Payer: Self-pay

## 2023-01-04 DIAGNOSIS — I10 Essential (primary) hypertension: Secondary | ICD-10-CM

## 2023-01-04 DIAGNOSIS — E7800 Pure hypercholesterolemia, unspecified: Secondary | ICD-10-CM

## 2023-01-04 DIAGNOSIS — F5101 Primary insomnia: Secondary | ICD-10-CM

## 2023-01-04 MED ORDER — ROSUVASTATIN CALCIUM 20 MG PO TABS: 20.0000 mg | ORAL_TABLET | Freq: Every day | ORAL | 1 refills | Status: DC

## 2023-01-04 MED ORDER — TRAZODONE HCL 50 MG PO TABS: 25.0000 mg | ORAL_TABLET | Freq: Every evening | ORAL | 3 refills | Status: DC | PRN

## 2023-01-04 MED ORDER — METOPROLOL SUCCINATE ER 100 MG PO TB24: 100.0000 mg | ORAL_TABLET | Freq: Every day | ORAL | 3 refills | Status: DC

## 2023-01-08 ENCOUNTER — Encounter (INDEPENDENT_AMBULATORY_CARE_PROVIDER_SITE_OTHER): Payer: Self-pay | Admitting: *Deleted

## 2023-01-08 ENCOUNTER — Encounter: Payer: Self-pay | Admitting: Family Medicine

## 2023-01-08 ENCOUNTER — Ambulatory Visit (INDEPENDENT_AMBULATORY_CARE_PROVIDER_SITE_OTHER): Payer: 59 | Admitting: Family Medicine

## 2023-01-08 VITALS — BP 184/111 | HR 100 | Ht 64.0 in | Wt 138.0 lb

## 2023-01-08 DIAGNOSIS — I10 Essential (primary) hypertension: Secondary | ICD-10-CM

## 2023-01-08 DIAGNOSIS — K529 Noninfective gastroenteritis and colitis, unspecified: Secondary | ICD-10-CM

## 2023-01-08 DIAGNOSIS — E559 Vitamin D deficiency, unspecified: Secondary | ICD-10-CM

## 2023-01-08 DIAGNOSIS — D509 Iron deficiency anemia, unspecified: Secondary | ICD-10-CM | POA: Diagnosis not present

## 2023-01-08 DIAGNOSIS — F419 Anxiety disorder, unspecified: Secondary | ICD-10-CM

## 2023-01-08 DIAGNOSIS — K580 Irritable bowel syndrome with diarrhea: Secondary | ICD-10-CM

## 2023-01-08 MED ORDER — PANTOPRAZOLE SODIUM 40 MG PO TBEC: 40.0000 mg | DELAYED_RELEASE_TABLET | Freq: Two times a day (BID) | ORAL | 0 refills | Status: DC

## 2023-01-08 MED ORDER — HYDROXYZINE PAMOATE 25 MG PO CAPS: 25.0000 mg | ORAL_CAPSULE | Freq: Three times a day (TID) | ORAL | 1 refills | Status: DC | PRN

## 2023-01-08 MED ORDER — CLONAZEPAM 0.5 MG PO TABS: 0.5000 mg | ORAL_TABLET | Freq: Two times a day (BID) | ORAL | 0 refills | Status: DC | PRN

## 2023-01-08 NOTE — Assessment & Plan Note (Addendum)
Patient reports diarrhea 3 times a day  Start Loperamide 4 mg OTC  Discussed  dietary changes, Advise consume soluble fibers such as psyllium, oat bran, barley, and beans can help IBS symptoms Low FODMAP diet, Start by eliminating FODMAPs foods x 6-8 weeks, then slowly reintroduce foods that are high in fermentable carbohydrates to determine tolerance, or cause. Avoid lactose, gluten, increase physical activity.  Increase fluid intake 6-8 glasses of water a day Avoid Trigger Foods: Fatty foods, caffeine, alcohol, and spicy foods can exacerbate IBS symptoms. Keeping a food diary to identify and avoid personal triggers can be helpful Referral placed to GI

## 2023-01-08 NOTE — Assessment & Plan Note (Addendum)
Blood pressure not controlled today. Labs ordered Patient reports did not take blood pressure medication Advise the importance of medication compliance Follow up in one week with at home blood pressure readings Patient agreed will take blood pressure medication today Continue Lisinopril 10 mg and metoprolol 100 mg daily  Explained non pharmacological interventions such as low salt, DASH diet discussed. Educated on stress reduction and physical activity minimum 150 minutes per week. Discussed signs and symptoms of major cardiovascular event and need to present to the ED. Patient verbalizes understanding regarding plan of care and all questions answered.

## 2023-01-08 NOTE — Patient Instructions (Signed)
        Great to see you today.  I have refilled the medication(s) we provide.   Please follow up with Cardiology regarding your AFIB [address] [place], [place] [address] [phone]    If labs were collected, we will inform you of lab results once received either by echart message or telephone call.   - echart message- for normal results that have been seen by the patient already.   - telephone call: abnormal results or if patient has not viewed results in their echart.   - Please take medications as prescribed. - Follow up with your primary health provider if any health concerns arises. - If symptoms worsen please contact your primary care provider and/or visit the emergency department.

## 2023-01-08 NOTE — Progress Notes (Signed)
Patient Office Visit   Subjective   Patient ID: Krista Carter, female    DOB: 11/06/1955  Age: 67 y.o. MRN: [id number]  CC:  Chief Complaint  Patient presents with   Hypertension    Patient is here for chronic f/u. No changes or concerns since last visit.     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  Hypertension: Patient here for follow-up of elevated blood pressure. She is not exercising and is adherent to low salt diet.  Blood pressure unknown if  well controlled at home. Cardiac symptoms headaches, fatigue and palpitations. Cardiovascular risk factors: dyslipidemia, hypertension, sedentary lifestyle, and smoking/ tobacco exposure.   Diarrhea  This is a chronic problem. The current episode started more than 1 year ago. The problem has been gradually worsening. The stool consistency is described as Watery. The patient states that diarrhea awakens her from sleep. Associated symptoms include bloating, headaches, increased flatus and vomiting. Pertinent negatives include no abdominal pain or fever. The symptoms are aggravated by rye/wheat, dairy products and stress. Risk factors include suspect food intake. Treatments tried: Pepto miso. The treatment provided no relief. Her past medical  history is significant for irritable bowel syndrome.      Outpatient Encounter Medications as of 01/08/2023  Medication Sig   aspirin EC 81 MG tablet Take 81 mg by mouth daily.     cyclobenzaprine (FLEXERIL) 5 MG tablet Take 1 tablet by mouth three times daily as needed for muscle spasm   dicyclomine (BENTYL) 10 MG capsule Take 10 mg by mouth 4 (four) times daily.   gabapentin (NEURONTIN) 100 MG capsule Take 100 mg by mouth 3 (three) times daily.   lisinopril (ZESTRIL) 10 MG tablet Take 1 tablet (10 mg total) by mouth daily.   metoprolol succinate (TOPROL-XL) 100 MG 24 hr tablet Take 1 tablet (100 mg total) by mouth daily.   rosuvastatin (CRESTOR) 20 MG tablet Take 1 tablet (20 mg total) by mouth at bedtime.   traZODone (DESYREL) 50 MG tablet Take 0.5 tablets (25 mg total) by mouth at bedtime as needed for sleep.   [DISCONTINUED] clonazePAM (KLONOPIN) 0.5 MG tablet Take 1 tablet by mouth twice daily as needed for anxiety   [DISCONTINUED] hydrOXYzine (VISTARIL) 25 MG capsule Take 1 capsule (25 mg total) by mouth every 8 (eight) hours as needed.   [DISCONTINUED] pantoprazole (PROTONIX) 40 MG tablet Take 1 tablet (40 mg total) by mouth 2 (two) times daily.   [DISCONTINUED] zolpidem (AMBIEN) 5 MG tablet Take 5 mg by mouth at bedtime as needed.   clonazePAM (KLONOPIN) 0.5 MG tablet Take 1 tablet (0.5 mg total) by mouth 2 (two) times daily as needed. for anxiety   hydrOXYzine (VISTARIL) 25 MG capsule Take 1 capsule (25 mg total) by mouth every 8 (eight) hours as needed.   pantoprazole (PROTONIX) 40 MG tablet Take 1 tablet (40 mg total) by mouth 2 (two) times daily.   No facility-administered encounter medications on file as of 01/08/2023.  Past Surgical History:  Procedure Laterality Date   ABDOMINAL HYSTERECTOMY     BONE MARROW BIOPSY  07/2010.    Benign fibroconnective and soft tissue with abundant acute and chronic inflammation, with foreign body multinucleated giant cell reaction. No  atypia, no malignancy   colonoscopies  2008, 09/2013   both normal.    ESOPHAGOGASTRODUODENOSCOPY  2005, 09/2013, 10/2013   ESOPHAGOGASTRODUODENOSCOPY (EGD) WITH PROPOFOL N/A 04/13/2015   Procedure: ESOPHAGOGASTRODUODENOSCOPY (EGD) WITH PROPOFOL;  Surgeon: Beverley Fiedler, MD;  Location: [facility];  Service: Endoscopy;  Laterality: N/A;   ORIF NONUNION HUMERUS FRACTURE  07/2010   with bone graft.     Review of Systems  Constitutional:  Negative for fever.  Eyes:  Negative for blurred vision.  Respiratory:  Negative for shortness of breath.   Cardiovascular:  Negative for chest pain.  Gastrointestinal:  Positive for bloating, diarrhea, flatus, heartburn, nausea and vomiting. Negative for abdominal pain, blood in stool, constipation and melena.  Genitourinary:  Negative for dysuria.  Neurological:  Positive for dizziness and headaches.      Objective    BP (!) 184/111   Pulse 100   Ht 5\' 4"  (1.626 m)   Wt 138 lb (62.6 kg)   SpO2 98%   BMI 23.69 kg/m   Physical Exam Vitals reviewed.  Constitutional:      General: She is not in acute distress.    Appearance: Normal appearance. She is not ill-appearing, toxic-appearing or diaphoretic.  HENT:     Head: Normocephalic.  Eyes:     General:        Right eye: No discharge.        Left eye: No discharge.     Conjunctiva/sclera: Conjunctivae normal.  Cardiovascular:     Rate and Rhythm: Normal rate.     Pulses: Normal pulses.  Pulmonary:     Effort: Pulmonary effort is normal. No respiratory distress.     Breath sounds: Normal breath sounds.  Abdominal:     General: Bowel sounds are normal.     Palpations: Abdomen is soft.     Tenderness: There is no abdominal tenderness. There is no right CVA tenderness, left CVA tenderness or guarding.  Musculoskeletal:        General: Normal range of motion.     Cervical back: Normal range of motion.  Skin:    General: Skin is warm and dry.     Capillary Refill: Capillary refill takes less  than 2 seconds.  Neurological:     Mental Status: She is alert.     Coordination: Coordination normal.     Gait: Gait normal.  Psychiatric:        Mood and Affect: Mood normal.        Behavior: Behavior normal.       Assessment & Plan:  Primary hypertension  Anxiety -     hydrOXYzine Pamoate; Take 1 capsule (25 mg total) by mouth every 8 (eight) hours as needed.  Dispense: 30 capsule; Refill: 1  Vitamin D deficiency -     VITAMIN D 25 Hydroxy (Vit-D Deficiency, Fractures)  Iron deficiency anemia, unspecified iron deficiency anemia type -     Iron, TIBC and Ferritin Panel -     B12 and Folate Panel  Chronic diarrhea -     GI Profile, Stool, PCR -     Ambulatory referral to Gastroenterology  Irritable bowel syndrome with diarrhea Assessment & Plan: Patient reports diarrhea 3 times a day  Start Loperamide  4 mg OTC  Discussed  dietary changes, Advise consume soluble fibers such as psyllium, oat bran, barley, and beans can help IBS symptoms Low FODMAP diet, Start by eliminating FODMAPs foods x 6-8 weeks, then slowly reintroduce foods that are high in fermentable carbohydrates to determine tolerance, or cause. Avoid lactose, gluten, increase physical activity.  Increase fluid intake 6-8 glasses of water a day Avoid Trigger Foods: Fatty foods, caffeine, alcohol, and spicy foods can exacerbate IBS symptoms. Keeping a food diary to identify and avoid personal triggers can be helpful Referral placed to GI    Essential hypertension Assessment & Plan: Blood pressure not controlled today. Labs ordered Patient reports did not take blood pressure medication Advise the importance of medication compliance Follow up in one week with at home blood pressure readings Patient agreed will take blood pressure medication today Continue Lisinopril 10 mg and metoprolol 100 mg daily  Explained non pharmacological interventions such as low salt, DASH diet discussed. Educated on stress reduction and  physical activity minimum 150 minutes per week. Discussed signs and symptoms of major cardiovascular event and need to present to the ED. Patient verbalizes understanding regarding plan of care and all questions answered.    Other orders -     clonazePAM; Take 1 tablet (0.5 mg total) by mouth 2 (two) times daily as needed. for anxiety  Dispense: 60 tablet; Refill: 0 -     Pantoprazole Sodium; Take 1 tablet (40 mg total) by mouth 2 (two) times daily.  Dispense: 60 tablet; Refill: 0    Return in about 1 week (around 01/15/2023), or AND 4 month Chronic Follow up, for re-check blood pressure, hypertension.   Cruzita Lederer Newman Nip, FNP

## 2023-01-09 LAB — B12 AND FOLATE PANEL
Folate: 3 ng/mL — ABNORMAL LOW (ref >3.0)
Vitamin B-12: 630 pg/mL (ref 232–1245)

## 2023-01-09 LAB — IRON,TIBC AND FERRITIN PANEL
Ferritin: 32 ng/mL (ref 15–150)
Iron Saturation: 14 % — ABNORMAL LOW (ref 15–55)
Iron: 51 ug/dL (ref 27–139)
Total Iron Binding Capacity: 360 ug/dL (ref 250–450)
UIBC: 309 ug/dL (ref 118–369)

## 2023-01-09 LAB — VITAMIN D 25 HYDROXY (VIT D DEFICIENCY, FRACTURES): Vit D, 25-Hydroxy: 31.1 ng/mL (ref 30.0–100.0)

## 2023-01-11 ENCOUNTER — Telehealth: Payer: Self-pay | Admitting: Family Medicine

## 2023-01-11 NOTE — Telephone Encounter (Signed)
Jefm Petty called [facility] need med refills  metoprolol succinate (TOPROL-XL) 100 MG 24 hr tablet   lisinopril (ZESTRIL) 10 MG tablet [[id number]]   cyclobenzaprine (FLEXERIL) 5 MG tablet [[id number]]   cyclobenzaprine (FLEXERIL) 5 MG tablet [[id number]]   traZODone (DESYREL) 50 MG tablet [[id number]]   clonazePAM (KLONOPIN) 0.5 MG tablet [[id number]]   Pharmacy: [facility] phone # [phone]

## 2023-01-14 ENCOUNTER — Encounter (INDEPENDENT_AMBULATORY_CARE_PROVIDER_SITE_OTHER): Payer: Self-pay | Admitting: Gastroenterology

## 2023-01-14 ENCOUNTER — Other Ambulatory Visit: Payer: Self-pay | Admitting: Family Medicine

## 2023-01-14 ENCOUNTER — Other Ambulatory Visit: Payer: Self-pay

## 2023-01-14 ENCOUNTER — Ambulatory Visit (INDEPENDENT_AMBULATORY_CARE_PROVIDER_SITE_OTHER): Payer: 59 | Admitting: Gastroenterology

## 2023-01-14 VITALS — BP 149/90 | HR 69 | Temp 97.5°F | Ht 64.0 in | Wt 139.9 lb

## 2023-01-14 DIAGNOSIS — Z1211 Encounter for screening for malignant neoplasm of colon: Secondary | ICD-10-CM | POA: Insufficient documentation

## 2023-01-14 DIAGNOSIS — I10 Essential (primary) hypertension: Secondary | ICD-10-CM | POA: Diagnosis not present

## 2023-01-14 DIAGNOSIS — E611 Iron deficiency: Secondary | ICD-10-CM | POA: Diagnosis not present

## 2023-01-14 DIAGNOSIS — D500 Iron deficiency anemia secondary to blood loss (chronic): Secondary | ICD-10-CM | POA: Insufficient documentation

## 2023-01-14 DIAGNOSIS — K529 Noninfective gastroenteritis and colitis, unspecified: Secondary | ICD-10-CM | POA: Insufficient documentation

## 2023-01-14 DIAGNOSIS — K279 Peptic ulcer, site unspecified, unspecified as acute or chronic, without hemorrhage or perforation: Secondary | ICD-10-CM | POA: Diagnosis not present

## 2023-01-14 DIAGNOSIS — K8681 Exocrine pancreatic insufficiency: Secondary | ICD-10-CM | POA: Insufficient documentation

## 2023-01-14 DIAGNOSIS — F17210 Nicotine dependence, cigarettes, uncomplicated: Secondary | ICD-10-CM

## 2023-01-14 DIAGNOSIS — F5101 Primary insomnia: Secondary | ICD-10-CM

## 2023-01-14 MED ORDER — PANTOPRAZOLE SODIUM 40 MG PO TBEC: 40.0000 mg | DELAYED_RELEASE_TABLET | Freq: Every day | ORAL | 5 refills | Status: DC

## 2023-01-14 MED ORDER — METOPROLOL SUCCINATE ER 100 MG PO TB24: 100.0000 mg | ORAL_TABLET | Freq: Every day | ORAL | 3 refills | Status: DC

## 2023-01-14 MED ORDER — PANCRELIPASE (LIP-PROT-AMYL) 36000-114000 UNITS PO CPEP: 36000.0000 [IU] | ORAL_CAPSULE | Freq: Three times a day (TID) | ORAL | 5 refills | Status: AC

## 2023-01-14 MED ORDER — LISINOPRIL 10 MG PO TABS: 10.0000 mg | ORAL_TABLET | Freq: Every day | ORAL | 3 refills | Status: DC

## 2023-01-14 MED ORDER — TRAZODONE HCL 50 MG PO TABS: 25.0000 mg | ORAL_TABLET | Freq: Every evening | ORAL | 3 refills | Status: DC | PRN

## 2023-01-14 NOTE — Patient Instructions (Signed)
It was very nice to meet you today, as dicussed with will plan for the following : 1) upper endoscopy and Colonoscopy  2) Provide stool sample   Start CREON after stool sample   Exocrine Pancreatic Insufficiency (EPI) is a condition in which your body doesn't provide enough pancreatic enzymes to properly digest your food (which can sometimes lead to some unpleasant digestive symptoms).  For many people, EPI is also a chronic lifelong condition.  That's why its so important to know what to expect with your EPI treatment, because with the right plan in place, EPI is manageable.  HOW DO I TAKE PANCREATIC ENZYMES?  Pancreatic Enzyme Replacement therapy (Creon) is only available through prescription and cannot be substituted with over the counter alternatives.  Your doctor will personalize your dose based on your weight, diet, and symptoms. The number of capsules you take per meal will depend on your prescribed dose.  Creon must be taken DURING every meal and snack.   Whether its a full meal or a snack, take Creon every time you eat. Remember to follow your treatment plan closely and take Creon exactly as prescribed - consistency is key!!

## 2023-01-14 NOTE — H&P (View-Only) (Signed)
Vista Lawman , M.D. Gastroenterology & Hepatology [facility] Gastroenterology [address] [place], [place] [address] Primary Care Physician: Rica Records, [place] [address] [place] [address]  Chief Complaint:  Chronic Diarrhea, Iron deficiency without anemia , Peptic ulcer disease , Colon cancer screening    History of Present Illness:   Krista Carter is a 67 y.o. female history of peptic ulcer disease due to chronic NSAID use , Afib not on [facility]  who presents for evaluation of Chronic Diarrhea, Iron deficiency without anemia , Peptic ulcer disease , Colon cancer screening  .  Patient was last seen by [facility] in 2017 for upper GI bleed found to have peptic ulcer disease in setting of NSAID use and suggested repeat upper endoscopy in 12 weeks to evaluate healing of the ulcer which patient never had one  Patient continues to take Goody powder intermittently because of migraines.  She has tried Tylenol but takes inadequate dose as she takes only 1 tablet  Patient main concern remains diarrhea which is almost always postprandial 3-4 bowel movements depending whenever she eats.  Sometimes she would wake up in the middle of the night to defecate.  Patient have not noticed any symptom correlation with certain food intake but is almost postprandial  Patient has intermittent diffuse abdominal discomfort which releases with defecation  The patient denies having any nausea, vomiting, fever, chills, hematochezia, melena, hematemesis, , jaundice, pruritus or weight loss.  Last EGD:2017 The mucosa of the esophagus appeared normal 2. Large ulcer was found in the gastric antrum ( source of recent UGI bleeding) 3. The duodenal mucosa showed no abnormalities in the bulb and 2nd part of the duodenum  Last Colonoscopy2015 Normal colon. No suggested repeat indicated  Labs from March 2024 with normal liver enzymes , ferritin 32 with iron  saturation of 14 hemoglobin A1c 6.2 TSH 1.4 hemoglobin 13.4 MCV 95 platelet 219 FHx: neg for any gastrointestinal/liver disease, no malignancies Social: Current cigarette smoker, occasional alcohol  Surgical: Hysterectomy  Past Medical History: Past Medical History:  Diagnosis Date   Acute alcoholic pancreatitis    Adrenal adenoma 02/2015   left, seen on CT.    Anemia 07/2010, 04/2015   normocytic.    Asthma, extrinsic, without status asthmaticus 07/09/2022   Atrial fibrillation (HCC)    Depression with anxiety 2008   Diabetes mellitus 2011   type 2.    Duodenitis 2005   HH also seen on EGD.    Dyslipidemia 2011   Expressive aphasia 03/2010   subjective word finding difficulty and slurred speach.  MRI/MRA, CT scan: mild small vessel disease, mild narrowing bilateral int carotids and RCA   Gastric ulcer    Hydronephrosis 2010   per ultrasound: mild, chronic, non-obstructing, bilateral.   Hypertension 2011   IBS (irritable bowel syndrome) 2008   diarrhea prone   Mitral valve regurgitation 03/2010   Mild MVR, EF 60-65% per echo.   Multiple gastric ulcers 09/2013   Benign, due to ASA powders.  H Pylori negative.     Pancreatitis    PVD (peripheral vascular disease) (HCC)    Renal insufficiency 2014   AKI 10/2012 due to dehydration, acute (probably infectious/viral) colitis, ACE use.    Severe protein-calorie malnutrition (HCC) 10/2012   Tibia/fibula fracture 04/2015   left.  Dital tibia, proximal fibula.     Past Surgical History: Past Surgical History:  Procedure Laterality Date   ABDOMINAL  HYSTERECTOMY     BONE MARROW BIOPSY  07/2010.    Benign fibroconnective and soft tissue with abundant acute and chronic inflammation, with foreign body multinucleated giant cell reaction. No atypia, no malignancy   colonoscopies  2008, 09/2013   both normal.    ESOPHAGOGASTRODUODENOSCOPY  2005, 09/2013, 10/2013   ESOPHAGOGASTRODUODENOSCOPY (EGD) WITH PROPOFOL N/A 04/13/2015   Procedure:  ESOPHAGOGASTRODUODENOSCOPY (EGD) WITH PROPOFOL;  Surgeon: Beverley Fiedler, MD;  Location: [facility];  Service: Endoscopy;  Laterality: N/A;   ORIF NONUNION HUMERUS FRACTURE  07/2010   with bone graft.     Family History: Family History  Problem Relation Age of Onset   Stroke Mother    Kidney cancer Mother    Heart failure Father    Hypertension Father    Heart disease Father    Diabetes Father    Diabetes Sister    Hypertension Sister    Colon cancer Maternal Uncle     Social History: Social History   Tobacco Use  Smoking Status Every Day   Current packs/day: 1.50   Average packs/day: 1.5 packs/day for 20.0 years (30.0 ttl pk-yrs)   Types: Cigarettes   Passive exposure: Current  Smokeless Tobacco Never   Social History   Substance and Sexual Activity  Alcohol Use Yes   Comment: wine coolers occasional   Social History   Substance and Sexual Activity  Drug Use No    Allergies: No Known Allergies  Medications: Current Outpatient Medications  Medication Sig Dispense Refill   aspirin EC 81 MG tablet Take 81 mg by mouth daily.       clonazePAM (KLONOPIN) 0.5 MG tablet Take 1 tablet (0.5 mg total) by mouth 2 (two) times daily as needed. for anxiety 60 tablet 0   cyclobenzaprine (FLEXERIL) 5 MG tablet Take 1 tablet by mouth three times daily as needed for muscle spasm 30 tablet 2   dicyclomine (BENTYL) 10 MG capsule Take 10 mg by mouth 4 (four) times daily.     gabapentin (NEURONTIN) 100 MG capsule Take 100 mg by mouth 3 (three) times daily.     hydrOXYzine (VISTARIL) 25 MG capsule Take 1 capsule (25 mg total) by mouth every 8 (eight) hours as needed. 30 capsule 1   lipase/protease/amylase (CREON) 36000 UNITS CPEP capsule Take 1 capsule (36,000 Units total) by mouth 3 (three) times daily with meals. 90 capsule 5   lisinopril (ZESTRIL) 10 MG tablet Take 1 tablet (10 mg total) by mouth daily. 30 tablet 3   metoprolol succinate (TOPROL-XL) 100 MG 24 hr tablet Take 1  tablet (100 mg total) by mouth daily. 30 tablet 3   rosuvastatin (CRESTOR) 20 MG tablet Take 1 tablet (20 mg total) by mouth at bedtime. 90 tablet 1   traZODone (DESYREL) 50 MG tablet Take 0.5 tablets (25 mg total) by mouth at bedtime as needed for sleep. 30 tablet 3   pantoprazole (PROTONIX) 40 MG tablet Take 1 tablet (40 mg total) by mouth daily. 30 tablet 5   No current facility-administered medications for this visit.    Review of Systems: GENERAL: negative for malaise, night sweats HEENT: No changes in hearing or vision, no nose bleeds or other nasal problems. NECK: Negative for lumps, goiter, pain and significant neck swelling RESPIRATORY: Negative for cough, wheezing CARDIOVASCULAR: Negative for chest pain, leg swelling, palpitations, orthopnea GI: SEE HPI MUSCULOSKELETAL: Negative for joint pain or swelling, back pain, and muscle pain. SKIN: Negative for lesions, rash HEMATOLOGY Negative for prolonged bleeding, bruising  easily, and swollen nodes. ENDOCRINE: Negative for cold or heat intolerance, polyuria, polydipsia and goiter. NEURO: negative for tremor, gait imbalance, syncope and seizures. The remainder of the review of systems is noncontributory.   Physical Exam: BP (!) 149/90   Pulse 69   Temp (!) 97.5 F (36.4 C) (Oral)   Ht 5\' 4"  (1.626 m)   Wt 139 lb 14.4 oz (63.5 kg)   BMI 24.01 kg/m  GENERAL: The patient is AO x3, in no acute distress. HEENT: Head is normocephalic and atraumatic. EOMI are intact. Mouth is well hydrated and without lesions. NECK: Supple. No masses LUNGS: Clear to auscultation. No presence of rhonchi/wheezing/rales. Adequate chest expansion HEART: RRR, normal s1 and s2. ABDOMEN: Soft, nontender, no guarding, no peritoneal signs, and nondistended. BS +. No masses. EXTREMITIES: Without any cyanosis, clubbing, rash, lesions or edema. NEUROLOGIC: AOx3, no focal motor deficit. SKIN: no jaundice, no rashes   Imaging/Labs: as above     Latest  Ref Rng & Units 06/26/2022   11:34 AM 06/14/2022    2:55 PM 10/08/2021    2:34 AM  CBC  WBC 3.4 - 10.8 x10E3/uL 6.3  7.7  7.6   Hemoglobin 11.1 - 15.9 g/dL 47.8  29.5  62.1   Hematocrit 34.0 - 46.6 % 39.5  41.2  37.1   Platelets 150 - 450 x10E3/uL 209  273  246    Lab Results  Component Value Date   IRON 51 01/08/2023   TIBC 360 01/08/2023   FERRITIN 32 01/08/2023    I personally reviewed and interpreted the available labs, imaging and endoscopic files.  CT abdomen pelvis 09/2021  IMPRESSION: Acute edematous pancreatitis. Calcifications at the uncinate process and accessory duct suggesting chronic inflammation as well.  Impression and Plan: Krista Carter is a 67 y.o. female history of peptic ulcer disease due to chronic NSAID use , Afib not on [facility]  who presents for evaluation of Chronic Diarrhea, Iron deficiency without anemia , Peptic ulcer disease , Colon cancer screening  .  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  The optimal timing is while consuming a meal and not before or after.  Proton pump inhibitors may improve the efficacy of the pancrelipase hence will add PPI which will aid in improved pancrelipase absorption and also the  underlying peptic ulcer disease   # Iron deficiency without anemia #Peptic ulcer disease  ferritin 32 with iron saturation of 14 Patient was last seen by [facility] in 2017 for upper GI bleed found to have peptic ulcer disease in setting of NSAID use and suggested repeat upper endoscopy in 12 weeks to evaluate healing of the ulcer which patient never had one  Patient advised to avoid NSAIDs and rather optimize taking Tylenol less than 2-3 g a day  Will proceed with  upper endoscopy to evaluate for healing of the peptic ulcer, will also obtain small bowel biopsies at the same time given iron deficiency  #Hypertension  The patient was found to have elevated blood pressure when vital signs were checked in the office. The blood pressure was rechecked by the nursing staff and it was found be persistently elevated >140/90 mmHg. I personally advised to the patient to follow up closely with PCP for hypertension control.   All questions were answered.      Vista Lawman, MD Gastroenterology and Hepatology [facility] Gastroenterology   This chart has been completed using [facility] Dictation software, and while attempts have been made to ensure accuracy , certain words and phrases may not be transcribed as intended

## 2023-01-14 NOTE — Progress Notes (Signed)
Krista Carter , M.D. Gastroenterology & Hepatology [facility] Gastroenterology [address] [place], [place] [address] Primary Care Physician: Rica Records, [place] [address] [place] [address]  Chief Complaint:  Chronic Diarrhea, Iron deficiency without anemia , Peptic ulcer disease , Colon cancer screening    History of Present Illness:   Krista Carter is a 67 y.o. female history of peptic ulcer disease due to chronic NSAID use , Afib not on [facility]  who presents for evaluation of Chronic Diarrhea, Iron deficiency without anemia , Peptic ulcer disease , Colon cancer screening  .  Patient was last seen by [facility] in 2017 for upper GI bleed found to have peptic ulcer disease in setting of NSAID use and suggested repeat upper endoscopy in 12 weeks to evaluate healing of the ulcer which patient never had one  Patient continues to take Goody powder intermittently because of migraines.  She has tried Tylenol but takes inadequate dose as she takes only 1 tablet  Patient main concern remains diarrhea which is almost always postprandial 3-4 bowel movements depending whenever she eats.  Sometimes she would wake up in the middle of the night to defecate.  Patient have not noticed any symptom correlation with certain food intake but is almost postprandial  Patient has intermittent diffuse abdominal discomfort which releases with defecation  The patient denies having any nausea, vomiting, fever, chills, hematochezia, melena, hematemesis, , jaundice, pruritus or weight loss.  Last EGD:2017 The mucosa of the esophagus appeared normal 2. Large ulcer was found in the gastric antrum ( source of recent UGI bleeding) 3. The duodenal mucosa showed no abnormalities in the bulb and 2nd part of the duodenum  Last Colonoscopy2015 Normal colon. No suggested repeat indicated  Labs from March 2024 with normal liver enzymes , ferritin 32 with iron  saturation of 14 hemoglobin A1c 6.2 TSH 1.4 hemoglobin 13.4 MCV 95 platelet 219 FHx: neg for any gastrointestinal/liver disease, no malignancies Social: Current cigarette smoker, occasional alcohol  Surgical: Hysterectomy  Past Medical History: Past Medical History:  Diagnosis Date   Acute alcoholic pancreatitis    Adrenal adenoma 02/2015   left, seen on CT.    Anemia 07/2010, 04/2015   normocytic.    Asthma, extrinsic, without status asthmaticus 07/09/2022   Atrial fibrillation (HCC)    Depression with anxiety 2008   Diabetes mellitus 2011   type 2.    Duodenitis 2005   HH also seen on EGD.    Dyslipidemia 2011   Expressive aphasia 03/2010   subjective word finding difficulty and slurred speach.  MRI/MRA, CT scan: mild small vessel disease, mild narrowing bilateral int carotids and RCA   Gastric ulcer    Hydronephrosis 2010   per ultrasound: mild, chronic, non-obstructing, bilateral.   Hypertension 2011   IBS (irritable bowel syndrome) 2008   diarrhea prone   Mitral valve regurgitation 03/2010   Mild MVR, EF 60-65% per echo.   Multiple gastric ulcers 09/2013   Benign, due to ASA powders.  H Pylori negative.     Pancreatitis    PVD (peripheral vascular disease) (HCC)    Renal insufficiency 2014   AKI 10/2012 due to dehydration, acute (probably infectious/viral) colitis, ACE use.    Severe protein-calorie malnutrition (HCC) 10/2012   Tibia/fibula fracture 04/2015   left.  Dital tibia, proximal fibula.     Past Surgical History: Past Surgical History:  Procedure Laterality Date   ABDOMINAL  HYSTERECTOMY     BONE MARROW BIOPSY  07/2010.    Benign fibroconnective and soft tissue with abundant acute and chronic inflammation, with foreign body multinucleated giant cell reaction. No atypia, no malignancy   colonoscopies  2008, 09/2013   both normal.    ESOPHAGOGASTRODUODENOSCOPY  2005, 09/2013, 10/2013   ESOPHAGOGASTRODUODENOSCOPY (EGD) WITH PROPOFOL N/A 04/13/2015   Procedure:  ESOPHAGOGASTRODUODENOSCOPY (EGD) WITH PROPOFOL;  Surgeon: Beverley Fiedler, MD;  Location: [facility];  Service: Endoscopy;  Laterality: N/A;   ORIF NONUNION HUMERUS FRACTURE  07/2010   with bone graft.     Family History: Family History  Problem Relation Age of Onset   Stroke Mother    Kidney cancer Mother    Heart failure Father    Hypertension Father    Heart disease Father    Diabetes Father    Diabetes Sister    Hypertension Sister    Colon cancer Maternal Uncle     Social History: Social History   Tobacco Use  Smoking Status Every Day   Current packs/day: 1.50   Average packs/day: 1.5 packs/day for 20.0 years (30.0 ttl pk-yrs)   Types: Cigarettes   Passive exposure: Current  Smokeless Tobacco Never   Social History   Substance and Sexual Activity  Alcohol Use Yes   Comment: wine coolers occasional   Social History   Substance and Sexual Activity  Drug Use No    Allergies: No Known Allergies  Medications: Current Outpatient Medications  Medication Sig Dispense Refill   aspirin EC 81 MG tablet Take 81 mg by mouth daily.       clonazePAM (KLONOPIN) 0.5 MG tablet Take 1 tablet (0.5 mg total) by mouth 2 (two) times daily as needed. for anxiety 60 tablet 0   cyclobenzaprine (FLEXERIL) 5 MG tablet Take 1 tablet by mouth three times daily as needed for muscle spasm 30 tablet 2   dicyclomine (BENTYL) 10 MG capsule Take 10 mg by mouth 4 (four) times daily.     gabapentin (NEURONTIN) 100 MG capsule Take 100 mg by mouth 3 (three) times daily.     hydrOXYzine (VISTARIL) 25 MG capsule Take 1 capsule (25 mg total) by mouth every 8 (eight) hours as needed. 30 capsule 1   lipase/protease/amylase (CREON) 36000 UNITS CPEP capsule Take 1 capsule (36,000 Units total) by mouth 3 (three) times daily with meals. 90 capsule 5   lisinopril (ZESTRIL) 10 MG tablet Take 1 tablet (10 mg total) by mouth daily. 30 tablet 3   metoprolol succinate (TOPROL-XL) 100 MG 24 hr tablet Take 1  tablet (100 mg total) by mouth daily. 30 tablet 3   rosuvastatin (CRESTOR) 20 MG tablet Take 1 tablet (20 mg total) by mouth at bedtime. 90 tablet 1   traZODone (DESYREL) 50 MG tablet Take 0.5 tablets (25 mg total) by mouth at bedtime as needed for sleep. 30 tablet 3   pantoprazole (PROTONIX) 40 MG tablet Take 1 tablet (40 mg total) by mouth daily. 30 tablet 5   No current facility-administered medications for this visit.    Review of Systems: GENERAL: negative for malaise, night sweats HEENT: No changes in hearing or vision, no nose bleeds or other nasal problems. NECK: Negative for lumps, goiter, pain and significant neck swelling RESPIRATORY: Negative for cough, wheezing CARDIOVASCULAR: Negative for chest pain, leg swelling, palpitations, orthopnea GI: SEE HPI MUSCULOSKELETAL: Negative for joint pain or swelling, back pain, and muscle pain. SKIN: Negative for lesions, rash HEMATOLOGY Negative for prolonged bleeding, bruising  easily, and swollen nodes. ENDOCRINE: Negative for cold or heat intolerance, polyuria, polydipsia and goiter. NEURO: negative for tremor, gait imbalance, syncope and seizures. The remainder of the review of systems is noncontributory.   Physical Exam: BP (!) 149/90   Pulse 69   Temp (!) 97.5 F (36.4 C) (Oral)   Ht 5\' 4"  (1.626 m)   Wt 139 lb 14.4 oz (63.5 kg)   BMI 24.01 kg/m  GENERAL: The patient is AO x3, in no acute distress. HEENT: Head is normocephalic and atraumatic. EOMI are intact. Mouth is well hydrated and without lesions. NECK: Supple. No masses LUNGS: Clear to auscultation. No presence of rhonchi/wheezing/rales. Adequate chest expansion HEART: RRR, normal s1 and s2. ABDOMEN: Soft, nontender, no guarding, no peritoneal signs, and nondistended. BS +. No masses. EXTREMITIES: Without any cyanosis, clubbing, rash, lesions or edema. NEUROLOGIC: AOx3, no focal motor deficit. SKIN: no jaundice, no rashes   Imaging/Labs: as above     Latest  Ref Rng & Units 06/26/2022   11:34 AM 06/14/2022    2:55 PM 10/08/2021    2:34 AM  CBC  WBC 3.4 - 10.8 x10E3/uL 6.3  7.7  7.6   Hemoglobin 11.1 - 15.9 g/dL 47.8  29.5  62.1   Hematocrit 34.0 - 46.6 % 39.5  41.2  37.1   Platelets 150 - 450 x10E3/uL 209  273  246    Lab Results  Component Value Date   IRON 51 01/08/2023   TIBC 360 01/08/2023   FERRITIN 32 01/08/2023    I personally reviewed and interpreted the available labs, imaging and endoscopic files.  CT abdomen pelvis 09/2021  IMPRESSION: Acute edematous pancreatitis. Calcifications at the uncinate process and accessory duct suggesting chronic inflammation as well.  Impression and Plan: SANVIKA CUTTINO is a 67 y.o. female history of peptic ulcer disease due to chronic NSAID use , Afib not on [facility]  who presents for evaluation of Chronic Diarrhea, Iron deficiency without anemia , Peptic ulcer disease , Colon cancer screening  .  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  The optimal timing is while consuming a meal and not before or after.  Proton pump inhibitors may improve the efficacy of the pancrelipase hence will add PPI which will aid in improved pancrelipase absorption and also the  underlying peptic ulcer disease   # Iron deficiency without anemia #Peptic ulcer disease  ferritin 32 with iron saturation of 14 Patient was last seen [facility] in 2017 for upper GI bleed found to have peptic ulcer disease in setting of NSAID use and suggested repeat upper endoscopy in 12 weeks to evaluate healing of the ulcer which patient never had one  Patient advised to avoid NSAIDs and rather optimize taking Tylenol less than 2-3 g a day  Will proceed with  upper endoscopy to evaluate for healing of the peptic ulcer, will also obtain small bowel biopsies at the same time given iron deficiency  #Hypertension  The patient was found to have elevated blood pressure when vital signs were checked in the office. The blood pressure was rechecked by the nursing staff and it was found be persistently elevated >140/90 mmHg. I personally advised to the patient to follow up closely with PCP for hypertension control.   All questions were answered.      Krista Lawman, MD Gastroenterology and Hepatology [facility] Gastroenterology   This chart has been completed using [facility] Dictation software, and while attempts have been made to ensure accuracy , certain words and phrases may not be transcribed as intended

## 2023-01-15 ENCOUNTER — Encounter: Payer: Self-pay | Admitting: Family Medicine

## 2023-01-15 ENCOUNTER — Ambulatory Visit: Payer: 59 | Admitting: Family Medicine

## 2023-01-15 VITALS — BP 131/75 | HR 70 | Resp 16 | Ht 64.0 in | Wt 139.4 lb

## 2023-01-15 DIAGNOSIS — R7303 Prediabetes: Secondary | ICD-10-CM

## 2023-01-15 DIAGNOSIS — I10 Essential (primary) hypertension: Secondary | ICD-10-CM

## 2023-01-15 DIAGNOSIS — M810 Age-related osteoporosis without current pathological fracture: Secondary | ICD-10-CM | POA: Diagnosis not present

## 2023-01-15 DIAGNOSIS — F5101 Primary insomnia: Secondary | ICD-10-CM

## 2023-01-15 MED ORDER — CYCLOBENZAPRINE HCL 5 MG PO TABS: 5.0000 mg | ORAL_TABLET | Freq: Three times a day (TID) | ORAL | 2 refills | Status: DC | PRN

## 2023-01-15 MED ORDER — TRAZODONE HCL 50 MG PO TABS: 50.0000 mg | ORAL_TABLET | Freq: Every evening | ORAL | 3 refills | Status: DC | PRN

## 2023-01-15 NOTE — Assessment & Plan Note (Signed)
Vitals:   01/15/23 1045 01/15/23 1053 01/15/23 1100  BP: (!) 142/90 130/88 131/75  Continue Lisinopril 10 mg and metoprolol 100 mg daily  Labs ordered. Discussed with  patient to monitor their blood pressure regularly and maintain a heart-healthy diet rich in fruits, vegetables, whole grains, and low-fat dairy, while reducing sodium intake to less than 2,300 mg per day. Regular physical activity, such as 30 minutes of moderate exercise most days of the week, will help lower blood pressure and improve overall cardiovascular health. Avoiding smoking, limiting alcohol consumption, and managing stress. Take  prescribed medication, & take it as directed and avoid skipping doses. Seek emergency care if your blood pressure is (over 180/120) or you experience chest pain, shortness of breath, or sudden vision changes.Patient verbalizes understanding regarding plan of care and all questions answered.

## 2023-01-15 NOTE — Patient Instructions (Signed)

## 2023-01-15 NOTE — Progress Notes (Signed)
Patient Office Visit   Subjective   Patient ID: KIMBERLIN SCHEEL, female    DOB: 11/07/1955  Age: 67 y.o. MRN: [id number]  CC:  Chief Complaint  Patient presents with   Hypertension    Follow up visit     HPI DENEA CHEANEY 67 year old female, presents to the clinic for HTN follow up. She  has a past medical history of Acute alcoholic pancreatitis, Adrenal adenoma (02/2015), Anemia (07/2010, 04/2015), Asthma, extrinsic, without status asthmaticus (07/09/2022), Atrial fibrillation (HCC), Depression with anxiety (2008), Diabetes mellitus (2011), Duodenitis (2005), Dyslipidemia (2011), Expressive aphasia (03/2010), Gastric ulcer, Hydronephrosis (2010), Hypertension (2011), IBS (irritable bowel syndrome) (2008), Mitral valve regurgitation (03/2010), Multiple gastric ulcers (09/2013), Pancreatitis, PVD (peripheral vascular disease) (HCC), Renal insufficiency (2014), Severe protein-calorie malnutrition (HCC) (10/2012), and Tibia/fibula fracture (04/2015).For the details of today's visit, please refer to assessment and plan.   HPI    Outpatient Encounter Medications as of 01/15/2023  Medication Sig   aspirin EC 81 MG tablet Take 81 mg by mouth daily.     clonazePAM (KLONOPIN) 0.5 MG tablet Take 1 tablet (0.5 mg total) by mouth 2 (two) times daily as needed. for anxiety   dicyclomine (BENTYL) 10 MG capsule Take 10 mg by mouth 4 (four) times daily.   gabapentin (NEURONTIN) 100 MG capsule Take 100 mg by mouth 3 (three) times daily.   hydrOXYzine (VISTARIL) 25 MG capsule Take 1 capsule (25 mg total) by mouth every 8 (eight) hours as needed.   lipase/protease/amylase (CREON) 36000 UNITS CPEP capsule Take 1 capsule (36,000 Units total) by mouth 3 (three) times daily with meals.   lisinopril (ZESTRIL) 10 MG tablet Take 1 tablet (10 mg total) by mouth daily.   metoprolol succinate (TOPROL-XL) 100 MG 24 hr tablet Take 1 tablet (100 mg total) by mouth daily.   pantoprazole (PROTONIX) 40 MG tablet Take 1  tablet (40 mg total) by mouth daily.   rosuvastatin (CRESTOR) 20 MG tablet Take 1 tablet (20 mg total) by mouth at bedtime.   [DISCONTINUED] cyclobenzaprine (FLEXERIL) 5 MG tablet Take 1 tablet by mouth three times daily as needed for muscle spasm   [DISCONTINUED] traZODone (DESYREL) 50 MG tablet Take 0.5 tablets (25 mg total) by mouth at bedtime as needed for sleep.   cyclobenzaprine (FLEXERIL) 5 MG tablet Take 1 tablet (5 mg total) by mouth 3 (three) times daily as needed. for muscle spams   traZODone (DESYREL) 50 MG tablet Take 1 tablet (50 mg total) by mouth at bedtime as needed for sleep.   No facility-administered encounter medications on file as of 01/15/2023.    Past Surgical History:  Procedure Laterality Date   ABDOMINAL HYSTERECTOMY     BONE MARROW BIOPSY  07/2010.    Benign fibroconnective and soft tissue with abundant acute and chronic inflammation, with foreign body multinucleated giant cell reaction. No atypia, no malignancy   colonoscopies  2008, 09/2013   both normal.    ESOPHAGOGASTRODUODENOSCOPY  2005, 09/2013, 10/2013   ESOPHAGOGASTRODUODENOSCOPY (EGD) WITH PROPOFOL N/A 04/13/2015   Procedure: ESOPHAGOGASTRODUODENOSCOPY (EGD) WITH PROPOFOL;  Surgeon: Beverley Fiedler, MD;  Location: [facility];  Service: Endoscopy;  Laterality: N/A;   ORIF NONUNION HUMERUS FRACTURE  07/2010   with bone graft.     Review of Systems  Constitutional:  Negative for chills and fever.  Respiratory:  Negative for shortness of breath.   Cardiovascular:  Negative for chest pain.  Gastrointestinal:  Negative for abdominal pain.  Neurological:  Negative  for dizziness and headaches.      Objective    BP 131/75   Pulse 70   Resp 16   Ht 5\' 4"  (1.626 m)   Wt 139 lb 6.4 oz (63.2 kg)   SpO2 94%   BMI 23.93 kg/m   Physical Exam Vitals reviewed.  Constitutional:      General: She is not in acute distress.    Appearance: Normal appearance. She is not ill-appearing, toxic-appearing or  diaphoretic.  HENT:     Head: Normocephalic.  Eyes:     General:        Right eye: No discharge.        Left eye: No discharge.     Conjunctiva/sclera: Conjunctivae normal.  Cardiovascular:     Rate and Rhythm: Normal rate.     Pulses: Normal pulses.  Pulmonary:     Effort: Pulmonary effort is normal. No respiratory distress.     Breath sounds: Normal breath sounds.  Musculoskeletal:        General: Normal range of motion.     Cervical back: Normal range of motion.  Skin:    General: Skin is warm and dry.     Capillary Refill: Capillary refill takes less than 2 seconds.  Neurological:     Mental Status: She is alert.     Coordination: Coordination normal.     Gait: Gait normal.  Psychiatric:        Mood and Affect: Mood normal.        Behavior: Behavior normal.       Assessment & Plan:  Primary hypertension -     Lipid panel -     CMP14+EGFR -     CBC with Differential/Platelet  Osteoporosis, unspecified osteoporosis type, unspecified pathological fracture presence -     DG Bone Density; Future  Prediabetes -     Hemoglobin A1c  Primary insomnia -     traZODone HCl; Take 1 tablet (50 mg total) by mouth at bedtime as needed for sleep.  Dispense: 30 tablet; Refill: 3  Essential hypertension Assessment & Plan: Vitals:   01/15/23 1045 01/15/23 1053 01/15/23 1100  BP: (!) 142/90 130/88 131/75  Continue Lisinopril 10 mg and metoprolol 100 mg daily  Labs ordered. Discussed with  patient to monitor their blood pressure regularly and maintain a heart-healthy diet rich in fruits, vegetables, whole grains, and low-fat dairy, while reducing sodium intake to less than 2,300 mg per day. Regular physical activity, such as 30 minutes of moderate exercise most days of the week, will help lower blood pressure and improve overall cardiovascular health. Avoiding smoking, limiting alcohol consumption, and managing stress. Take  prescribed medication, & take it as directed and avoid  skipping doses. Seek emergency care if your blood pressure is (over 180/120) or you experience chest pain, shortness of breath, or sudden vision changes.Patient verbalizes understanding regarding plan of care and all questions answered.    Other orders -     Cyclobenzaprine HCl; Take 1 tablet (5 mg total) by mouth 3 (three) times daily as needed. for muscle spams  Dispense: 30 tablet; Refill: 2    Return in about 4 months (around 05/18/2023), or if symptoms worsen or fail to improve, for chronic follow-up.   Cruzita Lederer Newman Nip, FNP

## 2023-01-16 LAB — CBC WITH DIFFERENTIAL/PLATELET
Basophils Absolute: 0.1 10*3/uL (ref 0.0–0.2)
Basos: 1 %
EOS (ABSOLUTE): 0.2 10*3/uL (ref 0.0–0.4)
Eos: 3 %
Hematocrit: 40.8 % (ref 34.0–46.6)
Hemoglobin: 13.3 g/dL (ref 11.1–15.9)
Immature Grans (Abs): 0 10*3/uL (ref 0.0–0.1)
Immature Granulocytes: 0 %
Lymphocytes Absolute: 2.3 10*3/uL (ref 0.7–3.1)
Lymphs: 29 %
MCH: 31.8 pg (ref 26.6–33.0)
MCHC: 32.6 g/dL (ref 31.5–35.7)
MCV: 98 fL — ABNORMAL HIGH (ref 79–97)
Monocytes Absolute: 0.5 10*3/uL (ref 0.1–0.9)
Monocytes: 6 %
Neutrophils Absolute: 4.7 10*3/uL (ref 1.4–7.0)
Neutrophils: 61 %
Platelets: 255 10*3/uL (ref 150–450)
RBC: 4.18 x10E6/uL (ref 3.77–5.28)
RDW: 13.1 % (ref 11.7–15.4)
WBC: 7.8 10*3/uL (ref 3.4–10.8)

## 2023-01-16 LAB — LIPID PANEL
Chol/HDL Ratio: 2.7 {ratio} (ref 0.0–4.4)
Cholesterol, Total: 181 mg/dL (ref 100–199)
HDL: 68 mg/dL (ref >39)
LDL Chol Calc (NIH): 90 mg/dL (ref 0–99)
Triglycerides: 131 mg/dL (ref 0–149)
VLDL Cholesterol Cal: 23 mg/dL (ref 5–40)

## 2023-01-16 LAB — CMP14+EGFR
ALT: 12 [IU]/L (ref 0–32)
AST: 14 [IU]/L (ref 0–40)
Albumin: 4.2 g/dL (ref 3.9–4.9)
Alkaline Phosphatase: 141 [IU]/L — ABNORMAL HIGH (ref 44–121)
BUN/Creatinine Ratio: 14 (ref 12–28)
BUN: 11 mg/dL (ref 8–27)
Bilirubin Total: <0.2 mg/dL (ref 0.0–1.2)
CO2: 23 mmol/L (ref 20–29)
Calcium: 9.4 mg/dL (ref 8.7–10.3)
Chloride: 104 mmol/L (ref 96–106)
Creatinine, Ser: 0.81 mg/dL (ref 0.57–1.00)
Globulin, Total: 2.6 g/dL (ref 1.5–4.5)
Glucose: 94 mg/dL (ref 70–99)
Potassium: 5.1 mmol/L (ref 3.5–5.2)
Sodium: 141 mmol/L (ref 134–144)
Total Protein: 6.8 g/dL (ref 6.0–8.5)
eGFR: 81 mL/min/{1.73_m2} (ref >59)

## 2023-01-16 LAB — HEMOGLOBIN A1C
Est. average glucose Bld gHb Est-mCnc: 134 mg/dL
Hgb A1c MFr Bld: 6.3 % — ABNORMAL HIGH (ref 4.8–5.6)

## 2023-01-17 LAB — PANCREATIC ELASTASE, FECAL: Pancreatic Elastase, Fecal: 202 ug Elast./g (ref >200)

## 2023-01-17 MED ORDER — PEG 3350-KCL-NA BICARB-NACL 420 G PO SOLR: 4000.0000 mL | Freq: Once | ORAL | 0 refills | Status: AC

## 2023-01-17 NOTE — Addendum Note (Signed)
Addended by: Marlowe Shores on: 01/17/2023 08:49 AM   Modules accepted: Orders

## 2023-01-18 NOTE — Progress Notes (Signed)
Hi Krista Carter ,  Can you please call the patient and tell the patient the stool sample shows borderline pancreatic insufficiency hence I recommend taking Creon as previously recommended  Thanks,  Vista Lawman, MD Gastroenterology and Hepatology [facility] Gastroenterology  ==============  Fecal Pancreatic Elastase 202 (Moderate Pancreatic Insufficiency:  100 - 200)

## 2023-01-22 LAB — FECAL FAT, QUALITATIVE
Fat Qual Neutral, Stl: NORMAL
Fat Qual Total, Stl: NORMAL

## 2023-01-25 ENCOUNTER — Telehealth: Payer: Self-pay | Admitting: Family Medicine

## 2023-01-25 NOTE — Telephone Encounter (Signed)
Patient called left voicemail said her blood pressure running low asking to speak to a nurse. Cal [phone] # [phone]

## 2023-01-28 ENCOUNTER — Telehealth: Payer: Self-pay | Admitting: Family Medicine

## 2023-01-28 ENCOUNTER — Other Ambulatory Visit: Payer: Self-pay | Admitting: Family Medicine

## 2023-01-28 DIAGNOSIS — M549 Dorsalgia, unspecified: Secondary | ICD-10-CM

## 2023-01-28 MED ORDER — PREDNISONE 20 MG PO TABS
20.0000 mg | ORAL_TABLET | Freq: Two times a day (BID) | ORAL | 0 refills | Status: AC
Start: 2023-01-28 — End: 2023-02-02

## 2023-01-28 NOTE — Progress Notes (Signed)
Patient called in for worsening back pain, muscle relaxer not helping with pain  Started Prednisone 20 mg twice daily for 5 days Lumbar xray ordered

## 2023-01-28 NOTE — Telephone Encounter (Signed)
Patient called said her back is no better, what is the next steps, patient was just seen. Asked for nurse to return her call at [phone]

## 2023-01-28 NOTE — Telephone Encounter (Signed)
Please inform patient I sent in prednisone 20 mg to be taken twice daily for 5 days Lumbar xray ordered [facility] to [facility]. For worsening symptoms please visit ED

## 2023-01-29 ENCOUNTER — Ambulatory Visit (HOSPITAL_COMMUNITY)
Admission: RE | Admit: 2023-01-29 | Discharge: 2023-01-29 | Disposition: A | Discharge status: Home / Self Care | Payer: 59 | Source: Ambulatory Visit | Attending: Family Medicine | Admitting: Family Medicine

## 2023-01-29 DIAGNOSIS — M549 Dorsalgia, unspecified: Secondary | ICD-10-CM | POA: Diagnosis present

## 2023-01-29 NOTE — Telephone Encounter (Signed)
Pt. Has been informed and understood instructions. She is going to have xray completed today.

## 2023-02-04 ENCOUNTER — Telehealth: Payer: Self-pay | Admitting: Cardiology

## 2023-02-04 NOTE — Telephone Encounter (Signed)
Sure.  ? ?Thanks for the update.  ? ?Tessa Lerner, DO, FACC ?

## 2023-02-04 NOTE — Telephone Encounter (Signed)
Patient would like to switch from Dr. Odis Hollingshead to Dr. Jenene Slicker.  The [place] office is closer to her home.  Are you both in agreement with this?

## 2023-02-08 ENCOUNTER — Ambulatory Visit: Payer: 59 | Admitting: Physician Assistant

## 2023-02-08 ENCOUNTER — Other Ambulatory Visit: Payer: Self-pay | Admitting: Family Medicine

## 2023-02-08 ENCOUNTER — Encounter (INDEPENDENT_AMBULATORY_CARE_PROVIDER_SITE_OTHER): Payer: Self-pay | Admitting: *Deleted

## 2023-02-09 ENCOUNTER — Other Ambulatory Visit: Payer: Self-pay | Admitting: Family Medicine

## 2023-02-11 ENCOUNTER — Other Ambulatory Visit: Payer: Self-pay | Admitting: Family Medicine

## 2023-02-11 NOTE — Anesthesia Preprocedure Evaluation (Signed)
Anesthesia Evaluation  Patient identified by MRN, date of birth, ID band Patient awake    Reviewed: Allergy & Precautions, NPO status , Patient's Chart, lab work & pertinent test results  Airway Mallampati: II  TM Distance: >3 FB Neck ROM: Full    Dental  (+) Missing, Poor Dentition, Dental Advisory Given Only a few teeth left.  She says none of them loose.:   Pulmonary asthma , Current Smoker   Pulmonary exam normal        Cardiovascular hypertension, Pt. on medications + Peripheral Vascular Disease  Normal cardiovascular exam+ dysrhythmias Atrial Fibrillation + Valvular Problems/Murmurs MR  Rhythm:Regular Rate:Normal     Neuro/Psych  Headaches  Anxiety Depression    Expressive aphasia - CVA?  negative psych ROS   GI/Hepatic Neg liver ROS, PUD,GERD  ,,Alcoholic pancreatitis   Endo/Other  diabetes, Type 2, Oral Hypoglycemic Agents  Adrenal adenoma  Renal/GU Renal InsufficiencyRenal diseasehydronephrosis  negative genitourinary   Musculoskeletal  (+) Arthritis , Osteoarthritis,    Abdominal   Peds negative pediatric ROS (+)  Hematology negative hematology ROS (+)   Anesthesia Other Findings Severe malnutrution  Reproductive/Obstetrics negative OB ROS                             Anesthesia Physical Anesthesia Plan  ASA: 3  Anesthesia Plan: General   Post-op Pain Management: Minimal or no pain anticipated   Induction: Intravenous  PONV Risk Score and Plan: Propofol infusion  Airway Management Planned: Natural Airway and Nasal Cannula  Additional Equipment: None  Intra-op Plan:   Post-operative Plan:   Informed Consent: I have reviewed the patients History and Physical, chart, labs and discussed the procedure including the risks, benefits and alternatives for the proposed anesthesia with the patient or authorized representative who has indicated his/her understanding and  acceptance.     Dental advisory given  Plan Discussed with: CRNA  Anesthesia Plan Comments:         Anesthesia Quick Evaluation

## 2023-02-12 ENCOUNTER — Encounter (HOSPITAL_COMMUNITY): Payer: Self-pay

## 2023-02-12 ENCOUNTER — Encounter (HOSPITAL_COMMUNITY)
Admission: RE | Disposition: A | Discharge status: Home / Self Care | Payer: Self-pay | Source: Home / Self Care | Attending: Gastroenterology

## 2023-02-12 ENCOUNTER — Telehealth: Payer: Self-pay | Admitting: Family Medicine

## 2023-02-12 ENCOUNTER — Other Ambulatory Visit: Payer: Self-pay

## 2023-02-12 ENCOUNTER — Ambulatory Visit (HOSPITAL_COMMUNITY)
Admission: RE | Admit: 2023-02-12 | Discharge: 2023-02-12 | Disposition: A | Discharge status: Home / Self Care | Payer: 59 | Attending: Gastroenterology | Admitting: Gastroenterology

## 2023-02-12 ENCOUNTER — Ambulatory Visit (HOSPITAL_BASED_OUTPATIENT_CLINIC_OR_DEPARTMENT_OTHER): Payer: 59 | Admitting: Anesthesiology

## 2023-02-12 ENCOUNTER — Ambulatory Visit (HOSPITAL_COMMUNITY): Payer: 59 | Admitting: Anesthesiology

## 2023-02-12 DIAGNOSIS — K219 Gastro-esophageal reflux disease without esophagitis: Secondary | ICD-10-CM | POA: Diagnosis not present

## 2023-02-12 DIAGNOSIS — Z1211 Encounter for screening for malignant neoplasm of colon: Secondary | ICD-10-CM | POA: Insufficient documentation

## 2023-02-12 DIAGNOSIS — K3189 Other diseases of stomach and duodenum: Secondary | ICD-10-CM | POA: Diagnosis not present

## 2023-02-12 DIAGNOSIS — I10 Essential (primary) hypertension: Secondary | ICD-10-CM | POA: Insufficient documentation

## 2023-02-12 DIAGNOSIS — Z7984 Long term (current) use of oral hypoglycemic drugs: Secondary | ICD-10-CM | POA: Diagnosis not present

## 2023-02-12 DIAGNOSIS — K644 Residual hemorrhoidal skin tags: Secondary | ICD-10-CM | POA: Diagnosis not present

## 2023-02-12 DIAGNOSIS — I4891 Unspecified atrial fibrillation: Secondary | ICD-10-CM | POA: Insufficient documentation

## 2023-02-12 DIAGNOSIS — F17210 Nicotine dependence, cigarettes, uncomplicated: Secondary | ICD-10-CM | POA: Insufficient documentation

## 2023-02-12 DIAGNOSIS — E119 Type 2 diabetes mellitus without complications: Secondary | ICD-10-CM

## 2023-02-12 DIAGNOSIS — K529 Noninfective gastroenteritis and colitis, unspecified: Secondary | ICD-10-CM | POA: Diagnosis present

## 2023-02-12 DIAGNOSIS — K52831 Collagenous colitis: Secondary | ICD-10-CM | POA: Diagnosis not present

## 2023-02-12 DIAGNOSIS — E1151 Type 2 diabetes mellitus with diabetic peripheral angiopathy without gangrene: Secondary | ICD-10-CM | POA: Insufficient documentation

## 2023-02-12 DIAGNOSIS — K6389 Other specified diseases of intestine: Secondary | ICD-10-CM | POA: Diagnosis not present

## 2023-02-12 DIAGNOSIS — K259 Gastric ulcer, unspecified as acute or chronic, without hemorrhage or perforation: Secondary | ICD-10-CM

## 2023-02-12 DIAGNOSIS — K2970 Gastritis, unspecified, without bleeding: Secondary | ICD-10-CM

## 2023-02-12 DIAGNOSIS — K31A19 Gastric intestinal metaplasia without dysplasia, unspecified site: Secondary | ICD-10-CM | POA: Diagnosis not present

## 2023-02-12 DIAGNOSIS — E611 Iron deficiency: Secondary | ICD-10-CM | POA: Insufficient documentation

## 2023-02-12 DIAGNOSIS — K8681 Exocrine pancreatic insufficiency: Secondary | ICD-10-CM

## 2023-02-12 DIAGNOSIS — F418 Other specified anxiety disorders: Secondary | ICD-10-CM | POA: Insufficient documentation

## 2023-02-12 DIAGNOSIS — K648 Other hemorrhoids: Secondary | ICD-10-CM | POA: Diagnosis not present

## 2023-02-12 DIAGNOSIS — N183 Chronic kidney disease, stage 3 unspecified: Secondary | ICD-10-CM

## 2023-02-12 DIAGNOSIS — K449 Diaphragmatic hernia without obstruction or gangrene: Secondary | ICD-10-CM | POA: Insufficient documentation

## 2023-02-12 DIAGNOSIS — K279 Peptic ulcer, site unspecified, unspecified as acute or chronic, without hemorrhage or perforation: Secondary | ICD-10-CM

## 2023-02-12 HISTORY — PX: COLONOSCOPY WITH PROPOFOL: SHX5780

## 2023-02-12 HISTORY — PX: ESOPHAGOGASTRODUODENOSCOPY (EGD) WITH PROPOFOL: SHX5813

## 2023-02-12 HISTORY — PX: BIOPSY: SHX5522

## 2023-02-12 LAB — HM COLONOSCOPY

## 2023-02-12 SURGERY — COLONOSCOPY WITH PROPOFOL
Anesthesia: General

## 2023-02-12 MED ORDER — LACTATED RINGERS IV SOLN: INTRAVENOUS | Status: DC | PRN

## 2023-02-12 MED ORDER — PROPOFOL 500 MG/50ML IV EMUL
INTRAVENOUS | Status: DC | PRN
  Administered 2023-02-12: 150 ug/kg/min via INTRAVENOUS

## 2023-02-12 MED ORDER — PANTOPRAZOLE SODIUM 40 MG PO TBEC: 40.0000 mg | DELAYED_RELEASE_TABLET | Freq: Every day | ORAL | 5 refills | Status: DC

## 2023-02-12 MED ORDER — PROPOFOL 500 MG/50ML IV EMUL
INTRAVENOUS | Status: AC
  Filled 2023-02-12: qty 100

## 2023-02-12 MED ORDER — PHENYLEPHRINE 80 MCG/ML (10ML) SYRINGE FOR IV PUSH (FOR BLOOD PRESSURE SUPPORT)
PREFILLED_SYRINGE | INTRAVENOUS | Status: DC | PRN
  Administered 2023-02-12 (×4): 160 ug via INTRAVENOUS

## 2023-02-12 MED ORDER — PHENYLEPHRINE 80 MCG/ML (10ML) SYRINGE FOR IV PUSH (FOR BLOOD PRESSURE SUPPORT)
PREFILLED_SYRINGE | INTRAVENOUS | Status: AC
  Filled 2023-02-12: qty 10

## 2023-02-12 MED ORDER — EPHEDRINE 5 MG/ML INJ
INTRAVENOUS | Status: AC
  Filled 2023-02-12: qty 5

## 2023-02-12 MED ORDER — EPHEDRINE SULFATE-NACL 50-0.9 MG/10ML-% IV SOSY
PREFILLED_SYRINGE | INTRAVENOUS | Status: DC | PRN
  Administered 2023-02-12 (×2): 5 mg via INTRAVENOUS

## 2023-02-12 MED ORDER — LIDOCAINE HCL (CARDIAC) PF 100 MG/5ML IV SOSY
PREFILLED_SYRINGE | INTRAVENOUS | Status: DC | PRN
  Administered 2023-02-12: 50 mg via INTRAVENOUS

## 2023-02-12 MED ORDER — PROPOFOL 10 MG/ML IV BOLUS
INTRAVENOUS | Status: DC | PRN
  Administered 2023-02-12: 40 mg via INTRAVENOUS
  Administered 2023-02-12: 100 mg via INTRAVENOUS

## 2023-02-12 NOTE — Op Note (Signed)
[facility] Patient Name: Krista Carter Procedure Date: 02/12/2023 8:02 AM MRN: [id number] Date of Birth: January 31, 1956 Attending MD: Sanjuan Dame , MD, [id number] CSN: [id number] Age: 67 Admit Type: Outpatient Procedure:                Colonoscopy Indications:              Chronic diarrhea Providers:                Sanjuan Dame, MD, Buel Ream. Thomasena Edis RN, RN,                            Francoise Ceo RN, RN, Dyann Ruddle Referring MD:              Medicines:                Monitored Anesthesia Care Complications:            No immediate complications. Estimated Blood Loss:     Estimated blood loss: none. Procedure:                Pre-Anesthesia Assessment:                           - Prior to the procedure, a History and Physical                            was performed, and patient medications and                            allergies were reviewed. The patient's tolerance of                            previous anesthesia was also reviewed. The risks                            and benefits of the procedure and the sedation                            options and risks were discussed with the patient.                            All questions were answered, and informed consent                            was obtained. Prior Anticoagulants: The patient has                            taken no anticoagulant or antiplatelet agents                            except for aspirin. ASA Grade Assessment: II - A                            patient with mild systemic disease. After reviewing  the risks and benefits, the patient was deemed in                            satisfactory condition to undergo the procedure.                           After obtaining informed consent, the colonoscope                            was passed under direct vision. Throughout the                            procedure, the patient's blood pressure, pulse, and                             oxygen saturations were monitored continuously. The                            PCF-HQ190L ([id number]) scope was introduced through                            the anus and advanced to the the terminal ileum.                            The colonoscopy was performed without difficulty.                            The patient tolerated the procedure well. The                            quality of the bowel preparation was evaluated                            using the BBPS [facility] Bowel Preparation Scale)                            with scores of: Right Colon = 3, Transverse Colon =                            3 and Left Colon = 3 (entire mucosa seen well with                            no residual staining, small fragments of stool or                            opaque liquid). The total BBPS score equals 9. The                            terminal ileum, ileocecal valve, appendiceal                            orifice, and rectum were photographed. Scope In: 8:27:28 AM Scope Out: 8:41:54 AM Scope Withdrawal  Time: 0 hours 11 minutes 21 seconds  Total Procedure Duration: 0 hours 14 minutes 26 seconds  Findings:      The perianal and digital rectal examinations were normal.      An area of mildly nodular mucosa was found in the ascending colon.       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      The terminal ileum appeared normal.      Non-bleeding external and internal hemorrhoids were found during       retroflexion. The hemorrhoids were small. Impression:               - Nodular mucosa in the ascending colon. Biopsied.                           - The examined portion of the ileum was normal.                           - Non-bleeding external and internal hemorrhoids. Moderate Sedation:      Per Anesthesia Care Recommendation:           - Patient has a contact number available for                             emergencies. The signs and symptoms of potential                            delayed complications were discussed with the                            patient. Return to normal activities tomorrow.                            Written discharge instructions were provided to the                            patient.                           - Resume previous diet.                           - Continue present medications.                           - Await pathology results.                           - Repeat colonoscopy in 10 years for screening                            purposes. Procedure Code(s):        --- Professional ---                           [phone], Colonoscopy, flexible; with biopsy, single  or multiple Diagnosis Code(s):        --- Professional ---                           K63.89, Other specified diseases of intestine                           K64.8, Other hemorrhoids                           K52.9, Noninfective gastroenteritis and colitis,                            unspecified CPT copyright 2022 American Medical Association. All rights reserved. The codes documented in this report are preliminary and upon coder review may  be revised to meet current compliance requirements. Sanjuan Dame, MD Sanjuan Dame, MD 02/12/2023 8:57:27 AM This report has been signed electronically. Number of Addenda: 0

## 2023-02-12 NOTE — Interval H&P Note (Signed)
History and Physical Interval Note:  02/12/2023 7:30 AM  [name]  has presented today for surgery, with the diagnosis of CHRONIC DIARRHEA, PEPTIC ULCER DISEASE.  The various methods of treatment have been discussed with the patient and family. After consideration of risks, benefits and other options for treatment, the patient has consented to  Procedure(s) with comments: COLONOSCOPY WITH PROPOFOL (N/A) - 1:45PM;ASA 1-2 ESOPHAGOGASTRODUODENOSCOPY (EGD) WITH PROPOFOL (N/A) - 1:45AM;ASA 1-2 as a surgical intervention.  The patient's history has been reviewed, patient examined, no change in status, stable for surgery.  I have reviewed the patient's chart and labs.  Questions were answered to the patient's satisfaction.     Krista Carter

## 2023-02-12 NOTE — Anesthesia Procedure Notes (Signed)
Date/Time: 02/12/2023 8:08 AM  Performed by: Julian Reil, CRNAPre-anesthesia Checklist: Emergency Drugs available, Suction available, Patient identified and Patient being monitored Patient Re-evaluated:Patient Re-evaluated prior to induction Oxygen Delivery Method: Nasal cannula Induction Type: IV induction Placement Confirmation: positive ETCO2

## 2023-02-12 NOTE — Telephone Encounter (Signed)
Copied from CRM [id number]. Topic: Clinical - Prescription Issue >> Feb 11, 2023  4:54 PM Lorin Glass B wrote: Reason for CRM: Patient stated rx clonazepam was sent to pharmacy on Friday 11/8 but pharmacy does not have rx yet. Made patient aware of potential 3 business day turnaround time. Patient wanted to check back with pharmacy and call us back with an update if rx still not there.

## 2023-02-12 NOTE — Discharge Instructions (Signed)
  Discharge instructions Please read the instructions outlined below and refer to this sheet in the next few weeks. These discharge instructions provide you with general information on caring for yourself after you leave the hospital. Your doctor may also give you specific instructions. While your treatment has been planned according to the most current medical practices available, unavoidable complications occasionally occur. If you have any problems or questions after discharge, please call your doctor. ACTIVITY You may resume your regular activity but move at a slower pace for the next 24 hours.  Take frequent rest periods for the next 24 hours.  Walking will help expel (get rid of) the air and reduce the bloated feeling in your abdomen.  No driving for 24 hours (because of the anesthesia (medicine) used during the test).  You may shower.  Do not sign any important legal documents or operate any machinery for 24 hours (because of the anesthesia used during the test).  NUTRITION Drink plenty of fluids.  You may resume your normal diet.  Begin with a light meal and progress to your normal diet.  Avoid alcoholic beverages for 24 hours or as instructed by your caregiver.  MEDICATIONS You may resume your normal medications unless your caregiver tells you otherwise.  WHAT YOU CAN EXPECT TODAY You may experience abdominal discomfort such as a feeling of fullness or "gas" pains.  FOLLOW-UP Your doctor will discuss the results of your test with you.  SEEK IMMEDIATE MEDICAL ATTENTION IF ANY OF THE FOLLOWING OCCUR: Excessive nausea (feeling sick to your stomach) and/or vomiting.  Severe abdominal pain and distention (swelling).  Trouble swallowing.  Temperature over 101 F (37.8 C).  Rectal bleeding or vomiting of blood.    Stop using high dose aspirin including Goody/BC powders, NSAIDs such as Aleve, ibuprofen, naproxen, Motrin, Voltaren or Advil (even the topical ones)    I hope you have a  great rest of your week!   Vista Lawman , M.D.. Gastroenterology and Hepatology [facility]

## 2023-02-12 NOTE — Op Note (Signed)
[facility] Patient Name: Krista Carter Procedure Date: 02/12/2023 8:04 AM MRN: [id number] Date of Birth: [birth year] Attending MD: Sanjuan Dame , MD, [id number] CSN: [id number] Age: 67 Admit Type: Outpatient Procedure:                Upper GI endoscopy Indications:              Follow-up of peptic ulcer, Diarrhea Providers:                Sanjuan Dame, MD, Buel Ream. Thomasena Edis RN, RN,                            Francoise Ceo RN, RN, Dyann Ruddle Referring MD:              Medicines:                Monitored Anesthesia Care Complications:            No immediate complications. Estimated Blood Loss:     Estimated blood loss: none. Procedure:                Pre-Anesthesia Assessment:                           - Prior to the procedure, a History and Physical                            was performed, and patient medications and                            allergies were reviewed. The patient's tolerance of                            previous anesthesia was also reviewed. The risks                            and benefits of the procedure and the sedation                            options and risks were discussed with the patient.                            All questions were answered, and informed consent                            was obtained. Prior Anticoagulants: The patient has                            taken no anticoagulant or antiplatelet agents                            except for aspirin. ASA Grade Assessment: II - A                            patient with mild systemic disease. After reviewing  the risks and benefits, the patient was deemed in                            satisfactory condition to undergo the procedure.                           After obtaining informed consent, the endoscope was                            passed under direct vision. Throughout the                            procedure, the patient's blood pressure, pulse, and                             oxygen saturations were monitored continuously. The                            GIF-H190 ([id number]) scope was introduced through the                            mouth, and advanced to the second part of duodenum.                            The upper GI endoscopy was accomplished without                            difficulty. The patient tolerated the procedure                            well. Scope In: 8:15:23 AM Scope Out: 8:22:55 AM Total Procedure Duration: 0 hours 7 minutes 32 seconds  Findings:      The examined esophagus was normal.      Esophagogastric landmarks were identified: the Z-line was found at 36 cm       and the gastroesophageal junction was found at 39 cm from the incisors.      A 3 cm hiatal hernia was present.      One non-bleeding gastric ulcer with a clean ulcer base (Forrest Class       III) was found in the gastric antrum. The lesion was 6 mm in largest       dimension. Biopsies were taken with a cold forceps for histology.      Mildly erythematous mucosa without bleeding was found in the entire       examined stomach. Biopsies were taken with a cold forceps for histology.      The duodenal bulb and second portion of the duodenum were normal.       Biopsies were taken with a cold forceps for histology. Impression:               - Normal esophagus.                           - Esophagogastric landmarks identified.                           -  3 cm hiatal hernia.                           - Non-bleeding gastric ulcer with a clean ulcer                            base (Forrest Class III). Biopsied. appears to be                            smaller than last exam in 2017                           - Erythematous mucosa in the stomach. Biopsied.                           - Normal duodenal bulb and second portion of the                            duodenum. Biopsied. Moderate Sedation:      Per Anesthesia Care Recommendation:           - Patient has a  contact number available for                            emergencies. The signs and symptoms of potential                            delayed complications were discussed with the                            patient. Return to normal activities tomorrow.                            Written discharge instructions were provided to the                            patient.                           - Resume previous diet.                           - Continue present medications.                           - Await pathology results.                           - Repeat upper endoscopy for surveillance based on                            pathology results.                           - Return to GI clinic as previously scheduled.                           -  PPi daily                           -Avoid NSAIDs, patient takes GOODy powder                            intermittently Procedure Code(s):        --- Professional ---                           [phone], Esophagogastroduodenoscopy, flexible,                            transoral; with biopsy, single or multiple Diagnosis Code(s):        --- Professional ---                           K44.9, Diaphragmatic hernia without obstruction or                            gangrene                           K25.9, Gastric ulcer, unspecified as acute or                            chronic, without hemorrhage or perforation                           K31.89, Other diseases of stomach and duodenum                           K27.9, Peptic ulcer, site unspecified, unspecified                            as acute or chronic, without hemorrhage or                            perforation CPT copyright 2022 American Medical Association. All rights reserved. The codes documented in this report are preliminary and upon coder review may  be revised to meet current compliance requirements. Sanjuan Dame, MD Sanjuan Dame, MD 02/12/2023 8:50:16 AM This report has been signed  electronically. Number of Addenda: 0

## 2023-02-12 NOTE — Anesthesia Postprocedure Evaluation (Signed)
Anesthesia Post Note  Patient: Krista Carter  Procedure(s) Performed: COLONOSCOPY WITH PROPOFOL ESOPHAGOGASTRODUODENOSCOPY (EGD) WITH PROPOFOL BIOPSY  Patient location during evaluation: PACU Anesthesia Type: General Level of consciousness: awake and alert Pain management: pain level controlled Vital Signs Assessment: post-procedure vital signs reviewed and stable Respiratory status: spontaneous breathing, nonlabored ventilation, respiratory function stable and patient connected to nasal cannula oxygen Cardiovascular status: blood pressure returned to baseline and stable Postop Assessment: no apparent nausea or vomiting Anesthetic complications: no   There were no known notable events for this encounter.   Last Vitals:  Vitals:   02/12/23 0738 02/12/23 0846  BP: (!) 165/91 96/61  Pulse: 67 60  Resp: 12 12  Temp: 36.7 C 36.4 C  SpO2: 99% 99%    Last Pain:  Vitals:   02/12/23 0846  TempSrc: Oral  PainSc: 0-No pain                 [name] [name]

## 2023-02-12 NOTE — Transfer of Care (Signed)
Immediate Anesthesia Transfer of Care Note  Patient: Krista Carter  Procedure(s) Performed: COLONOSCOPY WITH PROPOFOL ESOPHAGOGASTRODUODENOSCOPY (EGD) WITH PROPOFOL BIOPSY  Patient Location: Endoscopy Unit  Anesthesia Type:General  Level of Consciousness: awake, alert , and oriented  Airway & Oxygen Therapy: Patient Spontanous Breathing  Post-op Assessment: Report given to RN and Post -op Vital signs reviewed and stable  Post vital signs: Reviewed and stable  Last Vitals:  Vitals Value Taken Time  BP    Temp    Pulse    Resp    SpO2      Last Pain:  Vitals:   02/12/23 0808  TempSrc:   PainSc: 8       Patients Stated Pain Goal: 10 (02/12/23 0738)  Complications: No notable events documented.

## 2023-02-13 ENCOUNTER — Telehealth: Payer: Self-pay | Admitting: Family Medicine

## 2023-02-13 ENCOUNTER — Encounter (INDEPENDENT_AMBULATORY_CARE_PROVIDER_SITE_OTHER): Payer: Self-pay | Admitting: *Deleted

## 2023-02-13 ENCOUNTER — Other Ambulatory Visit: Payer: Self-pay | Admitting: Family Medicine

## 2023-02-13 ENCOUNTER — Other Ambulatory Visit: Payer: Self-pay

## 2023-02-13 LAB — SURGICAL PATHOLOGY

## 2023-02-13 MED ORDER — CLONAZEPAM 0.5 MG PO TABS: 0.5000 mg | ORAL_TABLET | Freq: Two times a day (BID) | ORAL | 0 refills | Status: DC | PRN

## 2023-02-13 NOTE — Telephone Encounter (Signed)
Refill has been sent to [facility] in [place]

## 2023-02-13 NOTE — Telephone Encounter (Signed)
Copied from CRM [id number]. Topic: Clinical - Prescription Issue >> Feb 13, 2023  2:20 PM Dennison Nancy wrote: Reason for CRM: patient requested her refill and  pharmacy do not have the prescription for the  clonazePAM (KLONOPIN) 0.5 MG tablet  call back # [phone] . Please reachout to patient when this is resolve

## 2023-02-13 NOTE — Progress Notes (Signed)
I reviewed the pathology results. Ann, can you send her a letter with the findings as described below please? Repeat colonoscopy in 1 year  Thanks,  Vista Lawman, MD Gastroenterology and Hepatology [facility]  ---------------------------------------------------------------------------------------------  [facility] [address], [place], [place] [address] Phone:  [phone]   02/13/23 [place], [place]   Dear [name],  I am writing to inform you that the biopsies taken during your recent endoscopic examination showed:  No H. 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   Many medications such as the one you are taking Protonix and painkillers can cause this   I recommend to talk to you in the clinic either in person or over the phone to discuss treatment options  Also I recommend repeat colonoscopy with repeat biopsies to be considered in 1 year if diarrhea does not improve with treatment   Please call us at [phone] if you have persistent problems or have questions about your condition that have not been fully answered at this time.  Sincerely,  Vista Lawman, MD Gastroenterology and Hepatology

## 2023-02-14 ENCOUNTER — Encounter (INDEPENDENT_AMBULATORY_CARE_PROVIDER_SITE_OTHER): Payer: Self-pay | Admitting: *Deleted

## 2023-02-14 ENCOUNTER — Telehealth: Payer: Self-pay | Admitting: Family Medicine

## 2023-02-14 NOTE — Telephone Encounter (Signed)
Med is needing pre auth   clonazePAM (KLONOPIN) 0.5 MG tablet [[id number]]

## 2023-02-15 ENCOUNTER — Other Ambulatory Visit: Payer: Self-pay | Admitting: Family Medicine

## 2023-02-15 NOTE — Telephone Encounter (Signed)
Spoke to pharmacy and to pt. Medication has been approved, filled and is waiting on pt. Pick up.

## 2023-02-18 ENCOUNTER — Encounter (HOSPITAL_COMMUNITY): Payer: Self-pay | Admitting: Gastroenterology

## 2023-02-19 ENCOUNTER — Telehealth: Payer: Self-pay

## 2023-02-22 ENCOUNTER — Ambulatory Visit: Payer: Self-pay | Admitting: Family Medicine

## 2023-02-22 ENCOUNTER — Encounter: Payer: Self-pay | Admitting: Student

## 2023-02-22 ENCOUNTER — Other Ambulatory Visit: Payer: Self-pay | Admitting: Family Medicine

## 2023-02-22 ENCOUNTER — Ambulatory Visit: Payer: 59 | Attending: Student | Admitting: Student

## 2023-02-22 VITALS — BP 130/80 | HR 70 | Ht 64.0 in | Wt 147.2 lb

## 2023-02-22 DIAGNOSIS — M545 Low back pain, unspecified: Secondary | ICD-10-CM

## 2023-02-22 DIAGNOSIS — Z8679 Personal history of other diseases of the circulatory system: Secondary | ICD-10-CM

## 2023-02-22 DIAGNOSIS — I491 Atrial premature depolarization: Secondary | ICD-10-CM

## 2023-02-22 DIAGNOSIS — E782 Mixed hyperlipidemia: Secondary | ICD-10-CM | POA: Diagnosis not present

## 2023-02-22 DIAGNOSIS — I10 Essential (primary) hypertension: Secondary | ICD-10-CM

## 2023-02-22 DIAGNOSIS — Z720 Tobacco use: Secondary | ICD-10-CM

## 2023-02-22 NOTE — Telephone Encounter (Addendum)
Copied from CRM [id number]. Topic: Clinical - Red Word Triage >> Feb 22, 2023 12:51 PM Gaetano Hawthorne wrote: [facility] that prompted transfer to Nurse Triage: Pain down right leg and some lower back pain - the Patient was given steroids last month   Chief Complaint: Long-term intermittent pain to right leg and right side of lower back Symptoms: Pain Frequency: Intermittent, progressive pain over the past month Pertinent Negatives: Patient denies numbness or tingling, denies fever and other symptoms. Disposition: [] ED /[] Urgent Care (no appt availability in office) / [x] Appointment(In office/virtual)/ []  [place] [facility]/ [] Home Care/ [] Refused Recommended Disposition /[] [place] [facility]/ []  Follow-up with PCP Additional Notes: Patient reports had x-ray of the area completed about a month ago, but didn't hear back about results. Had been given muscle relaxers that help sometimes, also given steroids that she stopped because they "made [her] hallucinate." Advised pt be seen today or Monday for pain, pt preferred to schedule for early morning appt 12/4. Pt verbalized understanding to call for earlier appt or go to urgent care if worsening before scheduled 12/4 appt and will go to ER if severe pain. Added patient to wait list for earlier appt.   Reason for Disposition  Leg pain or muscle cramp is a chronic symptom (recurrent or ongoing AND present > 4 weeks)  [1] Pain radiates into the thigh or further down the leg AND [2] one leg  Answer Assessment - Initial Assessment Questions 1. ONSET: "When did the pain start?"      For a month at least, intermittently, worsening the whole time 2. LOCATION: "Where is the pain located?"      Lower back right side and down right leg 3. PAIN: "How bad is the pain?"    (Scale 1-10; or mild, moderate, severe)   -  MILD (1-3): doesn't interfere with normal activities    -  MODERATE (4-7): interferes with normal activities (e.g., work or school) or awakens from  sleep, limping    -  SEVERE (8-10): excruciating pain, unable to do any normal activities, unable to walk     10/10 when using it for long time, was "in tears the other night from pain," "hard to get comfy at night." Patient says she's fine at rest but once active, the area starts hurting after 5 min, likes to stay active but having to sit down a lot more 4. WORK OR EXERCISE: "Has there been any recent work or exercise that involved this part of the body?"      Patient denies injury or falls 5. CAUSE: "What do you think is causing the leg pain?"     Not sure what may be causing it, has arthritis in knees 6. OTHER SYMPTOMS: "Do you have any other symptoms?" (e.g., chest pain, back pain, breathing difficulty, swelling, rash, fever, numbness, weakness)     Denies other symptoms, denies numbness or tingling.  Protocols used: Leg Pain-A-AH, Back Pain-A-AH

## 2023-02-22 NOTE — Progress Notes (Signed)
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 No fractures or acute abnormalities were found.   Treatment Plan:  Pain Management: Use NSAIDs and topical analgesics as needed. Physical Therapy: Focus on core strengthening, flexibility, and posture improvement. Lifestyle Changes: Maintain a healthy weight and avoid activities that worsen symptoms.

## 2023-02-22 NOTE — Progress Notes (Signed)
Cardiology Office Note    Date:  02/22/2023  ID:  Krista Carter, DOB [birth year], MRN [id number] Cardiologist:  Previously Dr. Odis Hollingshead and requested to switch to Dr. Jenene Slicker due to location  History of Present Illness:    Krista Carter is a 67 y.o. female with past medical history of paroxysmal atrial fibrillation, HTN, HLD, prediabetes and tobacco use who presents to the office today for annual follow-up.  She was previously examined by Dr. Odis Hollingshead in 10/2021 and she had been followed by Cardiology due to having atrial fibrillation with RVR during an admission in 09/2021 for acute alcoholic pancreatitis. She had spontaneously converted back to normal sinus rhythm during admission. She did have an event monitor to assess for recurrent atrial fibrillation and this showed predominantly normal sinus rhythm with brief episodes of atrial tachycardia but the longest lasting for 6 seconds. She was not committed to long-term anticoagulation and was continued on Toprol-XL for rate-control.  In talking with the patient today, she reports having occasional palpitations which feel like a "butterfly sensation" in her chest. Reviewed that her prior monitor showed PAC's and PVC's but she reports being unaware of the results at that time. Symptoms typically last for a few seconds and spontaneously resolve. She still consumes alcohol but says she does so on a social basis. Denies any binge drinking. Does consume multiple caffeinated sodas throughout the day (mostly [facility]). Denies any exertional chest pain or progressive dyspnea on exertion. No specific orthopnea, PND or pitting edema.  Studies Reviewed:   EKG: EKG is ordered today and demonstrates:   EKG Interpretation Date/Time:  Friday February 22 2023 13:56:48 EST Ventricular Rate:  70 PR Interval:  130 QRS Duration:  70 QT Interval:  384 QTC Calculation: 414 R Axis:   60  Text Interpretation: Sinus rhythm with Premature atrial complexes No acute  ST changes. Confirmed by Randall An ([id number]) on 02/22/2023 1:58:26 PM       Echocardiogram: 09/2021 IMPRESSIONS     1. Left ventricular ejection fraction, by estimation, is 60 to 65%. The  left ventricle has normal function. The left ventricle has no regional  wall motion abnormalities. Left ventricular diastolic parameters were  normal.   2. Right ventricular systolic function is normal. The right ventricular  size is normal. Tricuspid regurgitation signal is inadequate for assessing  PA pressure.   3. The mitral valve is grossly normal. No evidence of mitral valve  regurgitation. No evidence of mitral stenosis.   4. The aortic valve is tricuspid. There is mild calcification of the  aortic valve. Aortic valve regurgitation is not visualized. Aortic valve  sclerosis is present, with no evidence of aortic valve stenosis.   5. The inferior vena cava is normal in size with greater than 50%  respiratory variability, suggesting right atrial pressure of 3 mmHg.    Event Monitor: 10/2021 Zio patch extended outpatient telemetry for 5 days and 5 hours starting 10/18/2021: Minimum heart rate 56, maximum heart rate 156 bpm.  Predominant rhythm is sinus rhythm.  There were brief atrial tachycardia episodes x2, longest 6 seconds.   Occasional PACs and PVCs.   There were no patient triggered events.   NST: 10/2021 Exercise nuclear stress test 11/15/21 Myocardial perfusion is normal. Overall LV systolic function is normal without regional wall motion abnormalities. Stress LV EF: 63%. Low risk study. Normal ECG stress. The patient exercised for 4 minutes and 46 seconds of a Modified Bruce protocol, achieving approximately 6.77 METs and 110%  MPHR. The heart rate response was normal. The blood pressure response was normal. No previous exam available for comparison.   Risk Assessment/Calculations:    CHA2DS2-VASc Score = 3   This indicates a 3.2% annual risk of stroke. The patient's  score is based upon: CHF History: 0 HTN History: 1 Diabetes History: 0 Stroke History: 0 Vascular Disease History: 0 Age Score: 1 Gender Score: 1     Physical Exam:   VS:  BP 130/80   Pulse 70   Ht 5\' 4"  (1.626 m)   Wt 147 lb 3.2 oz (66.8 kg)   SpO2 96%   BMI 25.27 kg/m    Wt Readings from Last 3 Encounters:  02/22/23 147 lb 3.2 oz (66.8 kg)  02/12/23 135 lb (61.2 kg)  01/15/23 139 lb 6.4 oz (63.2 kg)     GEN: Well nourished, well developed female appearing in no acute distress NECK: No JVD; No carotid bruits CARDIAC: RRR, no murmurs, rubs, gallops RESPIRATORY:  Clear to auscultation without rales, wheezing or rhonchi  ABDOMEN: Appears non-distended. No obvious abdominal masses. EXTREMITIES: No clubbing or cyanosis. No pitting edema.  Distal pedal pulses are 2+ bilaterally.   Assessment and Plan:   1. History of Paroxysmal Atrial Fibrillation/PAC's - By review of her chart, this appeared to be an isolated episode during admission in 09/2021 for acute alcoholic pancreatitis. Outpatient monitor showed no recurrence as outlined above and she has not been committed to long-term anticoagulation.  - She reports occasional, brief palpitations which seem consistent with known PAC's and PVC's. Still consumes alcohol and also consumes several caffeinated beverages.  We reviewed the importance of trying to reduce her caffeine intake gradually over time. Continue Toprol-XL 100 mg and I did encourage her to take this at night as she does report some fatigue with the medication. We reviewed that if symptoms do not improve with conservative management including reduction of caffeine intake, Toprol-XL could be further titrated in the future or a repeat monitor placed.   2. HTN - Blood pressure is well-controlled at 130/80 during today's visit. Continue current medical therapy with Lisinopril 10 mg daily and Toprol-XL 100 mg daily.  3. HLD - Followed by her PCP. FLP in 01/2023 showed total  cholesterol 181 and LDL 90. Continue Crestor 20 mg daily.  4. Tobacco Use - She smokes 1.5 ppd. She has no plans to reduce use at this time and is not interested in NRT.   Signed, Ellsworth Lennox, PA-C

## 2023-02-22 NOTE — Patient Instructions (Signed)
Medication Instructions:  Your physician recommends that you continue on your current medications as directed. Please refer to the Current Medication list given to you today.   TAKE YOUR TOPROL XL AT NIGHTTIME  *If you need a refill on your cardiac medications before your next appointment, please call your pharmacy*   Lab Work: None If you have labs (blood work) drawn today and your tests are completely normal, you will receive your results only by: MyChart Message (if you have MyChart) OR A paper copy in the mail If you have any lab test that is abnormal or we need to change your treatment, we will call you to review the results.   Testing/Procedures: None   Follow-Up: At [facility], you and your health needs are our priority.  As part of our continuing mission to provide you with exceptional heart care, we have created designated Provider Care Teams.  These Care Teams include your primary Cardiologist (physician) and Advanced Practice Providers (APPs -  Physician Assistants and Nurse Practitioners) who all work together to provide you with the care you need, when you need it.  We recommend signing up for the patient portal called "MyChart".  Sign up information is provided on this After Visit Summary.  MyChart is used to connect with patients for Virtual Visits (Telemedicine).  Patients are able to view lab/test results, encounter notes, upcoming appointments, etc.  Non-urgent messages can be sent to your provider as well.   To learn more about what you can do with MyChart, go to [email or web address].    Your next appointment:   1 year(s)  Provider:   You may see Luane School, MD  or one of the following Advanced Practice Providers on your designated Care Team:   [place], PA-C  Jacolyn Reedy, [place]     Other Instructions

## 2023-02-22 NOTE — Telephone Encounter (Signed)
Pt. Has been informed. Understood lifestyle instructions.

## 2023-02-26 NOTE — Progress Notes (Signed)
Please let Krista Carter know this preference

## 2023-02-27 ENCOUNTER — Other Ambulatory Visit: Payer: Self-pay

## 2023-03-04 ENCOUNTER — Other Ambulatory Visit: Payer: Self-pay | Admitting: Family Medicine

## 2023-03-04 ENCOUNTER — Telehealth: Payer: Self-pay

## 2023-03-04 MED ORDER — TRAMADOL HCL 50 MG PO TABS: 50.0000 mg | ORAL_TABLET | Freq: Three times a day (TID) | ORAL | 0 refills | Status: AC | PRN

## 2023-03-04 NOTE — Telephone Encounter (Signed)
Please advice  

## 2023-03-04 NOTE — Telephone Encounter (Signed)
Copied from CRM #[id number]. Topic: Clinical - Medication Question >> Mar 04, 2023 11:49 AM Hendricks Limes wrote: Reason for CRM: PT's back is in severe pain and would like to be prescribed something for the pain. [phone]

## 2023-03-04 NOTE — Telephone Encounter (Signed)
Sent in tramadol

## 2023-03-05 NOTE — Telephone Encounter (Signed)
Pt informed

## 2023-03-06 ENCOUNTER — Ambulatory Visit: Payer: Self-pay | Admitting: Family Medicine

## 2023-03-06 ENCOUNTER — Ambulatory Visit (INDEPENDENT_AMBULATORY_CARE_PROVIDER_SITE_OTHER): Payer: 59 | Admitting: Gastroenterology

## 2023-03-06 ENCOUNTER — Other Ambulatory Visit (INDEPENDENT_AMBULATORY_CARE_PROVIDER_SITE_OTHER): Payer: Self-pay | Admitting: *Deleted

## 2023-03-06 DIAGNOSIS — K279 Peptic ulcer, site unspecified, unspecified as acute or chronic, without hemorrhage or perforation: Secondary | ICD-10-CM

## 2023-03-06 DIAGNOSIS — K8681 Exocrine pancreatic insufficiency: Secondary | ICD-10-CM

## 2023-03-06 DIAGNOSIS — K52839 Microscopic colitis, unspecified: Secondary | ICD-10-CM | POA: Diagnosis not present

## 2023-03-06 DIAGNOSIS — K52831 Collagenous colitis: Secondary | ICD-10-CM

## 2023-03-06 MED ORDER — BUDESONIDE 3 MG PO CPEP: ORAL_CAPSULE | ORAL | 0 refills | Status: DC

## 2023-03-06 MED ORDER — BUDESONIDE ER 9 MG PO TB24: 9.0000 mg | ORAL_TABLET | Freq: Every day | ORAL | 0 refills | Status: DC

## 2023-03-06 MED ORDER — FAMOTIDINE 20 MG PO TABS: 20.0000 mg | ORAL_TABLET | Freq: Every day | ORAL | Status: DC

## 2023-03-06 NOTE — Progress Notes (Signed)
Vista Lawman, M.D. Gastroenterology & Hepatology [facility] [address] [place], [place] [address] Primary Care Physician: Rica Records, [place] [address] [place] [address]  This is a telephone virtual visit.  It required patient-provider interaction for the medical decision making as documented below. The patient has consented and agreed to proceed with a Telehealth encounter.  Chief Complaint: Diarrhea, new diagnosed microscopic colitis,Iron deficiency without anemia , Peptic ulcer disease ,   History of Present Illness: Krista Carter is a 67 y.o. female history of peptic ulcer disease due to chronic NSAID use , Afib not on AC is contacted over the phone for Chronic Diarrhea, Iron deficiency without anemia , history of peptic ulcer disease .  Patient recently underwent bidirectional endoscopy biopsies positive for microscopic colitis   Patient was last seen by [facility] in 2017 for upper GI bleed found to have peptic ulcer disease in setting of NSAID use and suggested repeat upper endoscopy in 12 weeks to evaluate healing of the ulcer which patient never had one. Patient continues to take Goody powder intermittently because of migraines.  She has tried Tylenol but takes inadequate dose as she takes only 1 tablet   Patient main concern remains diarrhea which is almost always postprandial 3-4 bowel movements depending whenever she eats.  Sometimes she would wake up in the middle of the night to defecate.  Patient have not noticed any symptom correlation with certain food intake but is almost postprandial.  After starting pancrelipase diarrhea has slightly improved   Last EGD: 2024  - Normal esophagus. - Esophagogastric landmarks identified. - 3 cm hiatal hernia. - Non- bleeding gastric ulcer with a clean ulcer base ( Forrest Class III) . Biopsied. appears to be smaller than last exam in 2017 - Erythematous mucosa  in the stomach. Biopsied. - Normal duodenal bulb and second portion of the duodenum. Biopsied.  2024  - Nodular mucosa in the ascending colon. Biopsied. - The examined portion of the ileum was normal. - Non- bleeding external and internal hemorrhoids.  Suggested repeat 10 years   2017 The mucosa of the esophagus appeared normal 2. Large ulcer was found in the gastric antrum ( source of recent UGI bleeding) 3. The duodenal mucosa showed no abnormalities in the bulb and 2nd part of the duodenum   Last Colonoscopy2015 Normal colon. No suggested repeat indicated   Labs from March 2024 with normal liver enzymes , ferritin 32 with iron saturation of 14 hemoglobin A1c 6.2 TSH 1.4 hemoglobin 13.4 MCV 95 platelet 219 FHx: neg for any gastrointestinal/liver disease, no malignancies Social: Current cigarette smoker, occasional alcohol  Surgical: Hysterectomy  Past Medical History: Past Medical History:  Diagnosis Date   Acute alcoholic pancreatitis    Adrenal adenoma 02/2015   left, seen on CT.    Anemia 07/2010, 04/2015   normocytic.    Asthma, extrinsic, without status asthmaticus 07/09/2022   Atrial fibrillation (HCC)    Depression with anxiety 2008   Diabetes mellitus 2011   type 2.    Duodenitis 2005   HH also seen on EGD.    Dyslipidemia 2011   Expressive aphasia 03/2010   subjective word finding difficulty and slurred speach.  MRI/MRA, CT scan: mild small vessel disease, mild narrowing bilateral int carotids and RCA   Gastric ulcer    Hydronephrosis 2010   per ultrasound: mild, chronic, non-obstructing, bilateral.   Hypertension 2011   IBS (irritable  bowel syndrome) 2008   diarrhea prone   Mitral valve regurgitation 03/2010   Mild MVR, EF 60-65% per echo.   Multiple gastric ulcers 09/2013   Benign, due to ASA powders.  H Pylori negative.     Pancreatitis    PVD (peripheral vascular disease) (HCC)    Renal insufficiency 2014   AKI 10/2012 due to dehydration, acute (probably  infectious/viral) colitis, ACE use.    Severe protein-calorie malnutrition (HCC) 10/2012   Tibia/fibula fracture 04/2015   left.  Dital tibia, proximal fibula.     Past Surgical History: Past Surgical History:  Procedure Laterality Date   ABDOMINAL HYSTERECTOMY     BIOPSY  02/12/2023   Procedure: BIOPSY;  Surgeon: Franky Macho, MD;  Location: [facility];  Service: Endoscopy;;   BONE MARROW BIOPSY  07/2010.    Benign fibroconnective and soft tissue with abundant acute and chronic inflammation, with foreign body multinucleated giant cell reaction. No atypia, no malignancy   colonoscopies  2008, 09/2013   both normal.    COLONOSCOPY WITH PROPOFOL N/A 02/12/2023   Procedure: COLONOSCOPY WITH PROPOFOL;  Surgeon: Franky Macho, MD;  Location: [facility];  Service: Endoscopy;  Laterality: N/A;  1:45PM;ASA 1-2   ESOPHAGOGASTRODUODENOSCOPY  2005, 09/2013, 10/2013   ESOPHAGOGASTRODUODENOSCOPY (EGD) WITH PROPOFOL N/A 04/13/2015   Procedure: ESOPHAGOGASTRODUODENOSCOPY (EGD) WITH PROPOFOL;  Surgeon: Beverley Fiedler, MD;  Location: [facility];  Service: Endoscopy;  Laterality: N/A;   ESOPHAGOGASTRODUODENOSCOPY (EGD) WITH PROPOFOL N/A 02/12/2023   Procedure: ESOPHAGOGASTRODUODENOSCOPY (EGD) WITH PROPOFOL;  Surgeon: Franky Macho, MD;  Location: [facility];  Service: Endoscopy;  Laterality: N/A;  1:45AM;ASA 1-2   ORIF NONUNION HUMERUS FRACTURE  07/2010   with bone graft.     Family History: Family History  Problem Relation Age of Onset   Stroke Mother    Kidney cancer Mother    Heart failure Father    Hypertension Father    Heart disease Father    Diabetes Father    Diabetes Sister    Hypertension Sister    Colon cancer Maternal Uncle     Social History: Social History   Tobacco Use  Smoking Status Every Day   Current packs/day: 1.50   Average packs/day: 1.5 packs/day for 20.0 years (30.0 ttl pk-yrs)   Types: Cigarettes   Passive exposure: Current  Smokeless Tobacco  Never   Social History   Substance and Sexual Activity  Alcohol Use Yes   Comment: wine coolers occasional   Social History   Substance and Sexual Activity  Drug Use No    Allergies: No Known Allergies  Medications: Current Outpatient Medications  Medication Sig Dispense Refill   aspirin EC 81 MG tablet Take 81 mg by mouth daily.       clonazePAM (KLONOPIN) 0.5 MG tablet Take 1 tablet by mouth twice daily as needed for anxiety 60 tablet 0   clonazePAM (KLONOPIN) 0.5 MG tablet Take 1 tablet (0.5 mg total) by mouth 2 (two) times daily as needed. for anxiety 60 tablet 0   cyclobenzaprine (FLEXERIL) 5 MG tablet Take 1 tablet (5 mg total) by mouth 3 (three) times daily as needed. for muscle spams 30 tablet 2   dicyclomine (BENTYL) 10 MG capsule Take 10 mg by mouth 4 (four) times daily.     gabapentin (NEURONTIN) 100 MG capsule Take 100 mg by mouth 3 (three) times daily.     hydrOXYzine (VISTARIL) 25 MG capsule Take 1 capsule (25 mg total) by  mouth every 8 (eight) hours as needed. 30 capsule 1   lipase/protease/amylase (CREON) 36000 UNITS CPEP capsule Take 1 capsule (36,000 Units total) by mouth 3 (three) times daily with meals. 90 capsule 5   lisinopril (ZESTRIL) 10 MG tablet Take 1 tablet (10 mg total) by mouth daily. 30 tablet 3   metoprolol succinate (TOPROL-XL) 100 MG 24 hr tablet Take 1 tablet (100 mg total) by mouth daily. 30 tablet 3   pantoprazole (PROTONIX) 40 MG tablet Take 1 tablet (40 mg total) by mouth daily. 30 tablet 5   rosuvastatin (CRESTOR) 20 MG tablet Take 1 tablet (20 mg total) by mouth at bedtime. 90 tablet 1   traMADol (ULTRAM) 50 MG tablet Take 1 tablet (50 mg total) by mouth every 8 (eight) hours as needed for up to 5 days. 15 tablet 0   traZODone (DESYREL) 50 MG tablet Take 1 tablet (50 mg total) by mouth at bedtime as needed for sleep. 30 tablet 3   No current facility-administered medications for this visit.    Review of Systems: GENERAL: negative for  malaise, night sweats HEENT: No changes in hearing or vision, no nose bleeds or other nasal problems. NECK: Negative for lumps, goiter, pain and significant neck swelling RESPIRATORY: Negative for cough, wheezing CARDIOVASCULAR: Negative for chest pain, leg swelling, palpitations, orthopnea GI: SEE HPI MUSCULOSKELETAL: Negative for joint pain or swelling, back pain, and muscle pain. SKIN: Negative for lesions, rash PSYCH: Negative for sleep disturbance, mood disorder and recent psychosocial stressors. HEMATOLOGY Negative for prolonged bleeding, bruising easily, and swollen nodes. ENDOCRINE: Negative for cold or heat intolerance, polyuria, polydipsia and goiter. NEURO: negative for tremor, gait imbalance, syncope and seizures. The remainder of the review of systems is noncontributory.   Physical Exam: No exam was performed as this was a telephone encounter  Imaging/Labs: as above  I personally reviewed and interpreted the available labs, imaging and endoscopic files.   Fecal Pancreatic Elastase 202 (Moderate Pancreatic Insufficiency:   100 - 200)   Impression and Plan:  BERNIECE REMO is a 67 y.o. female history of peptic ulcer disease due to chronic NSAID use , Afib not on AC is contacted over the phone for Chronic Diarrhea, Iron deficiency without anemia , history of peptic ulcer disease .  Patient recently underwent bidirectional endoscopy biopsies found to have clean-based gastric ulcer and random colon biopsies positive for microscopic colitis  #Microscopic colitis #Exocrine pancreatic insufficiency  Patient diarrhea is multifactorial with fecal elastase low suggesting moderate pancreatic insufficiency with somewhat response with pancrelipase supplementation  exocrine pancreatic sufficiency given CT finding of calcification seen on pancreas and postprandial diarrhea.  Patient only risk factors for chronic pancreatitis is chronic smoking and has never been heavy alcohol use    Recent colonoscopy with random biopsies positive for microscopic colitis.  This is likely because of chronic NSAID and PPI use  Active microscopic colitis treatment remains removing offending agents(PPI, SSRI, NSAIDs) .Colonoscopy with lymphocytic colitis. Patient is having 2-3 liquid BM daily which is an indication for treatment   Based on current guidelines, 72,000 units of lipase should be initiated with meals, and 36,000 units with snacks.  The optimal timing is while consuming a meal and not before or after.   Will switch PPI to Pepcid  Will prescribe oral budesonide 9mg  daily for 6-8 weeks . This will be tapered to 6mg  for two weeks , followed by 3mg  for another two weeks and then discontinued   #Peptic ulcer disease  In 2017 at [facility] patient had upper GI bleed found to have peptic ulcer disease in setting of NSAID   Recent upper endoscopy with non- bleeding gastric ulcer with a clean ulcer base ( Forrest Class III) . Biopsied. appears to be smaller than last exam in 2017    Patient advised to avoid NSAIDs and rather optimize taking Tylenol less than 2-3 g a day May consider repeat EGD in 1 year after NSAID cessation  RTC 3 months   All questions were answered.      Vista Lawman, MD Gastroenterology and Hepatology [facility] Gastroenterology

## 2023-03-06 NOTE — Addendum Note (Signed)
Addended by: Metro Kung on: 03/06/2023 10:40 AM   Modules accepted: Orders

## 2023-03-06 NOTE — Addendum Note (Signed)
Addended by: Metro Kung on: 03/06/2023 09:16 AM   Modules accepted: Orders

## 2023-03-10 NOTE — Progress Notes (Unsigned)
   Established Patient Office Visit   Subjective  Patient ID: Krista Carter, female    DOB: 1955/11/13  Age: 67 y.o. MRN: [id number]  No chief complaint on file.   She  has a past medical history of Acute alcoholic pancreatitis, Adrenal adenoma (02/2015), Anemia (07/2010, 04/2015), Asthma, extrinsic, without status asthmaticus (07/09/2022), Atrial fibrillation (HCC), Depression with anxiety (2008), Diabetes mellitus (2011), Duodenitis (2005), Dyslipidemia (2011), Expressive aphasia (03/2010), Gastric ulcer, Hydronephrosis (2010), Hypertension (2011), IBS (irritable bowel syndrome) (2008), Mitral valve regurgitation (03/2010), Multiple gastric ulcers (09/2013), Pancreatitis, PVD (peripheral vascular disease) (HCC), Renal insufficiency (2014), Severe protein-calorie malnutrition (HCC) (10/2012), and Tibia/fibula fracture (04/2015).  HPI  ROS    Objective:     There were no vitals taken for this visit. {Vitals History (Optional):23777}  Physical Exam   No results found for any visits on 03/11/23.  The 10-year ASCVD risk score (Arnett DK, et al., 2019) is: 22.1%    Assessment & Plan:  There are no diagnoses linked to this encounter.  No follow-ups on file.   Cruzita Lederer Newman Nip, FNP

## 2023-03-10 NOTE — Patient Instructions (Signed)
        Great to see you today.  I have refilled the medication(s) we provide.    - Please take medications as prescribed. - Follow up with your primary health provider if any health concerns arises. - If symptoms worsen please contact your primary care provider and/or visit the emergency department.  

## 2023-03-11 ENCOUNTER — Encounter: Payer: Self-pay | Admitting: Family Medicine

## 2023-03-11 ENCOUNTER — Ambulatory Visit (INDEPENDENT_AMBULATORY_CARE_PROVIDER_SITE_OTHER): Payer: 59 | Admitting: Family Medicine

## 2023-03-11 VITALS — BP 142/93 | HR 86 | Ht 64.0 in | Wt 149.0 lb

## 2023-03-11 DIAGNOSIS — M545 Low back pain, unspecified: Secondary | ICD-10-CM | POA: Diagnosis not present

## 2023-03-11 MED ORDER — TRAMADOL HCL 50 MG PO TABS: 50.0000 mg | ORAL_TABLET | Freq: Three times a day (TID) | ORAL | 0 refills | Status: AC | PRN

## 2023-03-11 NOTE — Assessment & Plan Note (Signed)
Reviewed xray results with patient. Discussed plan of treatment to follow up with spin specialists and physical therapy for management Can take Tramadol 50 mg PRN We discussed the desired effects and potential side effects of the prescribed medication for back pain. Additionally, we reviewed non-pharmacological interventions, including the importance of rest, avoiding twisting, improper bending, and straining the lower back. I demonstrated proper body mechanics to prevent further injury and advised alternating between ice and heat therapy for relief. Stretching exercises for both the back and legs were recommended to improve flexibility and support recovery. The patient was advised to follow up if symptoms worsen or persist. The patient expressed understanding of the treatment plan, and all questions were thoroughly addressed.

## 2023-03-20 ENCOUNTER — Other Ambulatory Visit: Payer: Self-pay | Admitting: Family Medicine

## 2023-04-29 ENCOUNTER — Other Ambulatory Visit: Payer: Self-pay | Admitting: Family Medicine

## 2023-05-29 ENCOUNTER — Other Ambulatory Visit: Payer: Self-pay | Admitting: Family Medicine

## 2023-06-04 ENCOUNTER — Other Ambulatory Visit: Payer: Self-pay | Admitting: Family Medicine

## 2023-06-04 ENCOUNTER — Ambulatory Visit (INDEPENDENT_AMBULATORY_CARE_PROVIDER_SITE_OTHER): Payer: 59 | Admitting: Gastroenterology

## 2023-06-04 DIAGNOSIS — F5101 Primary insomnia: Secondary | ICD-10-CM

## 2023-06-25 ENCOUNTER — Other Ambulatory Visit: Payer: Self-pay | Admitting: Family Medicine

## 2023-06-25 DIAGNOSIS — I10 Essential (primary) hypertension: Secondary | ICD-10-CM

## 2023-07-01 ENCOUNTER — Other Ambulatory Visit: Payer: Self-pay | Admitting: Family Medicine

## 2023-07-02 NOTE — Telephone Encounter (Signed)
 The patient called in to check status on her refills that were just sent in yesterday. I told her it takes 48-72 business hours and she said she didn't know that but she understands.

## 2023-07-03 ENCOUNTER — Other Ambulatory Visit: Payer: Self-pay | Admitting: Family Medicine

## 2023-07-03 ENCOUNTER — Telehealth: Payer: Self-pay

## 2023-07-03 MED ORDER — CYCLOBENZAPRINE HCL 5 MG PO TABS: 5.0000 mg | ORAL_TABLET | Freq: Two times a day (BID) | ORAL | 2 refills | Status: DC | PRN

## 2023-07-03 MED ORDER — CLONAZEPAM 0.5 MG PO TABS: 0.5000 mg | ORAL_TABLET | Freq: Two times a day (BID) | ORAL | 0 refills | Status: DC | PRN

## 2023-07-03 NOTE — Telephone Encounter (Signed)
 Copied from CRM [id number]. Topic: Clinical - Prescription Issue >> Jul 02, 2023  2:22 PM Ivette P wrote: Reason for CRM: PT called in because pharmacy sent in a Fax over for the medication clonazePAM (KLONOPIN) 0.5 MG tablet and the muscle relaxers. Pt wants to know if it was received or if it needs to be resent.

## 2023-07-03 NOTE — Telephone Encounter (Signed)
 Medication was sent

## 2023-07-05 ENCOUNTER — Telehealth: Payer: Self-pay | Admitting: Family Medicine

## 2023-07-05 NOTE — Telephone Encounter (Signed)
 Copied from CRM [id number]. Topic: Clinical - Medication Question >> Jul 05, 2023  1:04 PM [name] wrote: Reason for CRM: Patient is experiencing vertigo. She states that she is dizzy, off balance, ears are ringing, inability to hear well, hyperhidrosis, and neck pain when turning her neck. Patient has an appointment on 04/09 but would like to know if she could be prescribed something for her symptoms. Please contact patient [phone].

## 2023-07-09 ENCOUNTER — Ambulatory Visit: Payer: Self-pay

## 2023-07-10 ENCOUNTER — Ambulatory Visit (INDEPENDENT_AMBULATORY_CARE_PROVIDER_SITE_OTHER): Payer: 59 | Admitting: Family Medicine

## 2023-07-10 ENCOUNTER — Encounter: Payer: Self-pay | Admitting: Family Medicine

## 2023-07-10 VITALS — BP 138/82 | HR 79 | Ht 64.0 in | Wt 140.0 lb

## 2023-07-10 DIAGNOSIS — D509 Iron deficiency anemia, unspecified: Secondary | ICD-10-CM

## 2023-07-10 DIAGNOSIS — F5101 Primary insomnia: Secondary | ICD-10-CM

## 2023-07-10 DIAGNOSIS — I10 Essential (primary) hypertension: Secondary | ICD-10-CM

## 2023-07-10 DIAGNOSIS — E038 Other specified hypothyroidism: Secondary | ICD-10-CM

## 2023-07-10 DIAGNOSIS — R7303 Prediabetes: Secondary | ICD-10-CM

## 2023-07-10 DIAGNOSIS — R42 Dizziness and giddiness: Secondary | ICD-10-CM | POA: Diagnosis not present

## 2023-07-10 DIAGNOSIS — F419 Anxiety disorder, unspecified: Secondary | ICD-10-CM

## 2023-07-10 DIAGNOSIS — E7800 Pure hypercholesterolemia, unspecified: Secondary | ICD-10-CM | POA: Diagnosis not present

## 2023-07-10 MED ORDER — HYDROXYZINE PAMOATE 25 MG PO CAPS: 25.0000 mg | ORAL_CAPSULE | Freq: Two times a day (BID) | ORAL | 3 refills | Status: DC | PRN

## 2023-07-10 MED ORDER — LISINOPRIL 10 MG PO TABS
10.0000 mg | ORAL_TABLET | Freq: Every day | ORAL | 1 refills | Status: AC
Start: 2023-07-10 — End: ?

## 2023-07-10 MED ORDER — ROSUVASTATIN CALCIUM 20 MG PO TABS: 20.0000 mg | ORAL_TABLET | Freq: Every day | ORAL | 1 refills | Status: DC

## 2023-07-10 MED ORDER — MECLIZINE HCL 12.5 MG PO TABS: 12.5000 mg | ORAL_TABLET | Freq: Two times a day (BID) | ORAL | 0 refills | Status: AC | PRN

## 2023-07-10 NOTE — Progress Notes (Signed)
 Established Patient Office Visit   Subjective  Patient ID: Krista Carter, female    DOB: [birth year]  Age: 68 y.o. MRN: [id number]  Chief Complaint  Patient presents with   Medical Management of Chronic Issues    4 month f/u   Experiencing vertigo symptoms x 2 wks it is affecting daily life  Exhaustion, pt. Reports sleeping more than usual     She  has a past medical history of Acute alcoholic pancreatitis, Adrenal adenoma (02/2015), Anemia (07/2010, 04/2015), Asthma, extrinsic, without status asthmaticus (07/09/2022), Atrial fibrillation (HCC), Depression with anxiety (2008), Diabetes mellitus (2011), Duodenitis (2005), Dyslipidemia (2011), Expressive aphasia (03/2010), Gastric ulcer, Hydronephrosis (2010), Hypertension (2011), IBS (irritable bowel syndrome) (2008), Mitral valve regurgitation (03/2010), Multiple gastric ulcers (09/2013), Pancreatitis, PVD (peripheral vascular disease) (HCC), Renal insufficiency (2014), Severe protein-calorie malnutrition (HCC) (10/2012), and Tibia/fibula fracture (04/2015).  Dizziness  The patient reports a recurrent episode of dizziness, which began more than one month ago. The dizziness is persistent, occurring constantly, and has progressively worsened in severity over time. The episodes are characterized by a sensation of lightheadedness and imbalance, with the patient experiencing vertigo on occasion. Associated symptoms include frequent headaches, nausea, neck pain, and visual disturbances, which have become more pronounced during these episodes. Additionally, the patient reports difficulty with hearing in both ears, which may contribute to the perception of dizziness. The dizziness is aggravated by physical activities such as walking, exertion, and standing, causing significant functional limitations and an increased risk of falls. The patient has not yet sought treatment or attempted any interventions to manage the symptoms, and no relief has been reported  thus far.      Review of Systems  Constitutional:  Negative for chills and fever.  Respiratory:  Negative for shortness of breath.   Cardiovascular:  Negative for chest pain.  Gastrointestinal:  Positive for nausea. Negative for abdominal pain.  Neurological:  Positive for dizziness and headaches.      Objective:     BP 138/82   Pulse 79   Ht 5\' 4"  (1.626 m)   Wt 140 lb 0.6 oz (63.5 kg)   SpO2 95%   BMI 24.04 kg/m  BP Readings from Last 3 Encounters:  07/10/23 138/82  03/11/23 (!) 142/93  02/22/23 130/80      Physical Exam Vitals reviewed.  Constitutional:      General: She is not in acute distress.    Appearance: Normal appearance. She is not ill-appearing, toxic-appearing or diaphoretic.  HENT:     Head: Normocephalic.  Eyes:     General:        Right eye: No discharge.        Left eye: No discharge.     Conjunctiva/sclera: Conjunctivae normal.  Cardiovascular:     Rate and Rhythm: Normal rate.     Pulses: Normal pulses.     Heart sounds: Normal heart sounds.  Pulmonary:     Effort: Pulmonary effort is normal. No respiratory distress.     Breath sounds: Normal breath sounds.  Abdominal:     General: Bowel sounds are normal.     Palpations: Abdomen is soft.     Tenderness: There is no abdominal tenderness. There is no right CVA tenderness, left CVA tenderness or guarding.  Musculoskeletal:     Cervical back: Normal range of motion.  Skin:    General: Skin is warm and dry.     Capillary Refill: Capillary refill takes less than 2 seconds.  Neurological:  Mental Status: She is alert.     Coordination: Coordination normal.     Gait: Gait normal.  Psychiatric:        Mood and Affect: Mood normal.        Behavior: Behavior normal.      No results found for any visits on 07/10/23.  The 10-year ASCVD risk score (Arnett DK, et al., 2019) is: 13.5%    Assessment & Plan:  TSH (thyroid-stimulating hormone deficiency) -     TSH + free T4  Primary  insomnia  Hypercholesteremia -     Rosuvastatin Calcium; Take 1 tablet (20 mg total) by mouth at bedtime.  Dispense: 90 tablet; Refill: 1  Primary hypertension -     Lisinopril; Take 1 tablet (10 mg total) by mouth daily.  Dispense: 90 tablet; Refill: 1 -     BMP8+eGFR -     Lipid panel -     CBC with Differential/Platelet  Anxiety -     hydrOXYzine Pamoate; Take 1 capsule (25 mg total) by mouth 2 (two) times daily as needed.  Dispense: 60 capsule; Refill: 3  Iron deficiency anemia, unspecified iron deficiency anemia type -     Iron, TIBC and Ferritin Panel -     B12 and Folate Panel  Dizziness Assessment & Plan: Trial on meclizine12.5 PRN Orthostatic blood pressure measurements: Supine: 135/85  Standing:  132/85  Sitting:138/82 Labs ordered to rule out deficiency, BMP, CBC, Thyroid panel, Iron panel, Vit B12, Vit D Referral placed to Audiology Testing to evaluate inner ear function, as many vertigo cases are related to vestibular issues. Discussed , sleep with your head slightly elevated and rise slowly from bed in the morning to prevent sudden dizziness. Limiting salt, caffeine, and alcohol may also reduce fluid buildup in the inner ear, which can lessen vertigo episodes. Avoid positions or activities that trigger symptoms, such as bending over or quickly turning your head. Additionally, focus on slow, steady movements     Orders: -     Meclizine HCl; Take 1 tablet (12.5 mg total) by mouth 2 (two) times daily as needed for dizziness.  Dispense: 30 tablet; Refill: 0 -     Ambulatory referral to Audiology  Prediabetes -     Hemoglobin A1c    Return in about 4 months (around 11/09/2023), or if symptoms worsen or fail to improve, for chronic follow-up.   Cruzita Lederer Newman Nip, FNP

## 2023-07-10 NOTE — Assessment & Plan Note (Signed)
 Trial on meclizine12.5 PRN Orthostatic blood pressure measurements: Supine: 135/85  Standing:  132/85  Sitting:138/82 Labs ordered to rule out deficiency, BMP, CBC, Thyroid panel, Iron panel, Vit B12, Vit D Referral placed to Audiology Testing to evaluate inner ear function, as many vertigo cases are related to vestibular issues. Discussed , sleep with your head slightly elevated and rise slowly from bed in the morning to prevent sudden dizziness. Limiting salt, caffeine, and alcohol may also reduce fluid buildup in the inner ear, which can lessen vertigo episodes. Avoid positions or activities that trigger symptoms, such as bending over or quickly turning your head. Additionally, focus on slow, steady movements

## 2023-07-10 NOTE — Patient Instructions (Signed)

## 2023-07-11 NOTE — Progress Notes (Signed)
 Please inform patient,  Iron  panel low,  I advise to consume iron rich foods such as Red meat, pork, poultry, seafood, beans, dark green leafy vegetables, such as spinach, dried fruit, such as raisins and apricots. Iron-fortified cereals, and peas. I encourage to take over the counter iron tablets 65 mg once daily. In our next visit we'll repeat labs.      Gastrointestinal side effects are common with Iron tablet intake such as : constipation, nausea, abdominal discomfort, and dark stools.  Manage Constipation Tips: Hydration: Drink plenty of water throughout the day. High-Fiber Diet: Include fruits, vegetables, and whole grains to promote bowel movements. Stool Softeners or Laxatives OTC: Docusate sodium (Colace): A gentle stool softener. Polyethylene glycol (MiraLAX): Effective for occasional constipation.   Hemoglobin A1c  5.8  indicates prediabetes - no medication intervention just lifestyle changes   Pre-Diabetes Diet  Eat More:  Whole grains: Oats, quinoa, brown rice. Vegetables: Leafy greens, broccoli, green beans. Fruits: Low-sugar like berries, apples. Lean proteins: Chicken, fish, beans, eggs. Healthy fats: Nuts, seeds, avocado, olive oil.  Limit:  Refined carbs: White bread, pastries. Sugary foods: Soda, candy, desserts. Fried and fatty foods.  Tips:  Eat balanced meals with portion control. Stay hydrated (water over sugary drinks). Pair with regular exercise (e.g., walking). Example: Grilled chicken, quinoa, and steamed broccoli.  Combine diet with regular physical activity (e.g., 30 minutes of walking daily or 5 times per week.)    Your folate level is slightly low at 2.6 ng/mL. Folate is a crucial B-vitamin that helps your body produce healthy red blood cells  I recommend taking a folate supplement to help raise your levels. The typical dosage is between 400 mcg once daily

## 2023-07-12 LAB — TSH+FREE T4
Free T4: 0.77 ng/dL — ABNORMAL LOW (ref 0.82–1.77)
TSH: 1.05 u[IU]/mL (ref 0.450–4.500)

## 2023-07-12 LAB — CBC WITH DIFFERENTIAL/PLATELET
Basophils Absolute: 0.1 10*3/uL (ref 0.0–0.2)
Basos: 1 %
EOS (ABSOLUTE): 0.1 10*3/uL (ref 0.0–0.4)
Eos: 2 %
Hematocrit: 37.3 % (ref 34.0–46.6)
Hemoglobin: 12.5 g/dL (ref 11.1–15.9)
Immature Grans (Abs): 0 10*3/uL (ref 0.0–0.1)
Immature Granulocytes: 0 %
Lymphocytes Absolute: 1.8 10*3/uL (ref 0.7–3.1)
Lymphs: 31 %
MCH: 32.3 pg (ref 26.6–33.0)
MCHC: 33.5 g/dL (ref 31.5–35.7)
MCV: 96 fL (ref 79–97)
Monocytes Absolute: 0.3 10*3/uL (ref 0.1–0.9)
Monocytes: 6 %
Neutrophils Absolute: 3.6 10*3/uL (ref 1.4–7.0)
Neutrophils: 60 %
Platelets: 195 10*3/uL (ref 150–450)
RBC: 3.87 x10E6/uL (ref 3.77–5.28)
RDW: 14.6 % (ref 11.7–15.4)
WBC: 5.9 10*3/uL (ref 3.4–10.8)

## 2023-07-12 LAB — LIPID PANEL
Chol/HDL Ratio: 2.3 ratio (ref 0.0–4.4)
Cholesterol, Total: 143 mg/dL (ref 100–199)
HDL: 61 mg/dL (ref >39)
LDL Chol Calc (NIH): 64 mg/dL (ref 0–99)
Triglycerides: 95 mg/dL (ref 0–149)
VLDL Cholesterol Cal: 18 mg/dL (ref 5–40)

## 2023-07-12 LAB — IRON,TIBC AND FERRITIN PANEL
Ferritin: 51 ng/mL (ref 15–150)
Iron Saturation: 13 % — ABNORMAL LOW (ref 15–55)
Iron: 40 ug/dL (ref 27–139)
Total Iron Binding Capacity: 310 ug/dL (ref 250–450)
UIBC: 270 ug/dL (ref 118–369)

## 2023-07-12 LAB — BMP8+EGFR
BUN/Creatinine Ratio: 16 (ref 12–28)
BUN: 16 mg/dL (ref 8–27)
CO2: 17 mmol/L — ABNORMAL LOW (ref 20–29)
Calcium: 8.9 mg/dL (ref 8.7–10.3)
Chloride: 105 mmol/L (ref 96–106)
Creatinine, Ser: 1.02 mg/dL — ABNORMAL HIGH (ref 0.57–1.00)
Glucose: 89 mg/dL (ref 70–99)
Potassium: 4.7 mmol/L (ref 3.5–5.2)
Sodium: 139 mmol/L (ref 134–144)
eGFR: 61 mL/min/{1.73_m2} (ref >59)

## 2023-07-12 LAB — HEMOGLOBIN A1C
Est. average glucose Bld gHb Est-mCnc: 128 mg/dL
Hgb A1c MFr Bld: 6.1 % — ABNORMAL HIGH (ref 4.8–5.6)

## 2023-07-12 LAB — B12 AND FOLATE PANEL
Folate: 2.6 ng/mL — ABNORMAL LOW (ref >3.0)
Vitamin B-12: 633 pg/mL (ref 232–1245)

## 2023-07-18 ENCOUNTER — Ambulatory Visit: Payer: Self-pay | Admitting: *Deleted

## 2023-07-18 NOTE — Telephone Encounter (Signed)
  Chief Complaint: Multiple complaints: dizziness, fatigue, shaky , Nausea, some diarrhea, decreased appetite  Symptoms: see above, Patient was recently in office- diagnosed with anemia and treated with iron and Meclizine for dizziness- Patient reports some improvement- but she is still dealing with the symptoms Frequency: over 2 weeks Pertinent Negatives: Patient denies chest pain, fever Disposition: [] ED /[] Urgent Care (no appt availability in office) / [] Appointment(In office/virtual)/ []  [place] [facility]/ [] Home Care/ [] Refused Recommended Disposition /[] [place] [facility]/ [x]  Follow-up with PCP Additional Notes: Patient feels she needs follow up before her next appointment- there are no appointments in office within disposition- note sent for provider review and scheduling

## 2023-07-18 NOTE — Telephone Encounter (Signed)
 Copied from CRM [id number]. Topic: Clinical - Red Word Triage >> Jul 18, 2023  9:48 AM Turkey B wrote: [facility] that prompted transfer to Nurse Triage: pt has the shakes and nauseated Reason for Disposition  [1] MODERATE dizziness (e.g., interferes with normal activities) AND [2] has been evaluated by doctor (or NP/PA) for this  Answer Assessment - Initial Assessment Questions 1. DESCRIPTION: "Describe your dizziness."     Patient states she is still having symptoms- was in office 07/10/23 and is presently being treated for anemia, dizziness- Meclizine- is helping some, shaking- seems some better- unable to hold objects well 2. LIGHTHEADED: "Do you feel lightheaded?" (e.g., somewhat faint, woozy, weak upon standing)     no 3. VERTIGO: "Do you feel like either you or the room is spinning or tilting?" (i.e. vertigo)     Patient does have hx vertigo 4. SEVERITY: "How bad is it?"  "Do you feel like you are going to faint?" "Can you stand and walk?"   - MILD: Feels slightly dizzy, but walking normally.   - MODERATE: Feels unsteady when walking, but not falling; interferes with normal activities (e.g., school, work).   - SEVERE: Unable to walk without falling, or requires assistance to walk without falling; feels like passing out now.      Moderate- goes slow 5. ONSET:  "When did the dizziness begin?"     Over 2 weeks 6. AGGRAVATING FACTORS: "Does anything make it worse?" (e.g., standing, change in head position)     Standing, walking- patient states fatigue 7. HEART RATE: "Can you tell me your heart rate?" "How many beats in 15 seconds?"  (Note: not all patients can do this)       na 8. CAUSE: "What do you think is causing the dizziness?"     Multiple diagnosis 9. RECURRENT SYMPTOM: "Have you had dizziness before?" If Yes, ask: "When was the last time?" "What happened that time?"     Yes- vertigo and fatigue in the past 10. OTHER SYMPTOMS: "Do you have any other symptoms?" (e.g., fever, chest  pain, vomiting, diarrhea, bleeding)       Nausea, diarrhea, decreased appetite  Protocols used: Dizziness - Lightheadedness-A-AH

## 2023-07-22 ENCOUNTER — Encounter (INDEPENDENT_AMBULATORY_CARE_PROVIDER_SITE_OTHER): Payer: Self-pay | Admitting: Gastroenterology

## 2023-07-22 ENCOUNTER — Telehealth (INDEPENDENT_AMBULATORY_CARE_PROVIDER_SITE_OTHER): Payer: Self-pay | Admitting: Gastroenterology

## 2023-07-22 ENCOUNTER — Ambulatory Visit (INDEPENDENT_AMBULATORY_CARE_PROVIDER_SITE_OTHER): Admitting: Gastroenterology

## 2023-07-22 VITALS — BP 154/94 | HR 58 | Temp 97.6°F | Ht 64.0 in | Wt 140.5 lb

## 2023-07-22 DIAGNOSIS — K8681 Exocrine pancreatic insufficiency: Secondary | ICD-10-CM

## 2023-07-22 DIAGNOSIS — R1310 Dysphagia, unspecified: Secondary | ICD-10-CM

## 2023-07-22 DIAGNOSIS — K279 Peptic ulcer, site unspecified, unspecified as acute or chronic, without hemorrhage or perforation: Secondary | ICD-10-CM | POA: Diagnosis not present

## 2023-07-22 DIAGNOSIS — R1319 Other dysphagia: Secondary | ICD-10-CM | POA: Insufficient documentation

## 2023-07-22 DIAGNOSIS — R630 Anorexia: Secondary | ICD-10-CM

## 2023-07-22 DIAGNOSIS — K52839 Microscopic colitis, unspecified: Secondary | ICD-10-CM

## 2023-07-22 DIAGNOSIS — R6881 Early satiety: Secondary | ICD-10-CM

## 2023-07-22 DIAGNOSIS — K52831 Collagenous colitis: Secondary | ICD-10-CM | POA: Insufficient documentation

## 2023-07-22 DIAGNOSIS — K529 Noninfective gastroenteritis and colitis, unspecified: Secondary | ICD-10-CM

## 2023-07-22 DIAGNOSIS — K219 Gastro-esophageal reflux disease without esophagitis: Secondary | ICD-10-CM

## 2023-07-22 MED ORDER — FAMOTIDINE 20 MG PO TABS: 20.0000 mg | ORAL_TABLET | Freq: Every day | ORAL | 0 refills | Status: DC

## 2023-07-22 MED ORDER — PANCRELIPASE (LIP-PROT-AMYL) 36000-114000 UNITS PO CPEP: ORAL_CAPSULE | ORAL | 11 refills | Status: AC

## 2023-07-22 MED ORDER — BISMUTH 262 MG PO CHEW: 2.0000 | CHEWABLE_TABLET | Freq: Four times a day (QID) | ORAL | 0 refills | Status: AC

## 2023-07-22 MED ORDER — FAMOTIDINE 20 MG PO TABS
20.0000 mg | ORAL_TABLET | Freq: Every day | ORAL | Status: DC
Start: 2023-07-22 — End: 2023-07-22

## 2023-07-22 NOTE — Progress Notes (Signed)
 Krista Carter, M.D. Gastroenterology & Hepatology [facility] Gastroenterology [address] [place], [place] [address] Primary Care Physician: Rosanna Comment, [place] [address] [place] [address]  Chief Complaint: Diarrhea,  microscopic colitis,Iron deficiency without anemia , Peptic ulcer disease ,   History of Present Illness: Krista Carter is a 68 y.o. 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   Patient underwent bidirectional endoscopy biopsies positive for microscopic colitis and upper endoscopy with peptic ulcer disease 02/2023  Patient main concern remains diarrhea which is almost always postprandial 3-4 bowel movements depending whenever she eats.  Sometimes she would wake up in the middle of the night to defecate.  After starting pancrelipase  diarrhea has slightly improved  Patient reports she took budesonide  for microscopic colitis but made her constipated and hallucinate as well and she stopped  She has been taking Protonix  all this time as well for chronic GERD.  Patient also reports recently dysphagia-like symptoms, food slowing down in middle of her chest   In 2017 for upper GI bleed found to have peptic ulcer disease in setting of NSAID use / Goody powder intermittently because of migraines.     Last EGD: 2024  - Normal esophagus. - Esophagogastric landmarks identified. - 3 cm hiatal hernia. - Non- bleeding gastric ulcer with a clean ulcer base ( Forrest Class III) . Biopsied. appears to be smaller than last exam in 2017 - Erythematous mucosa in the stomach. Biopsied. - Normal duodenal bulb and second portion of the duodenum. Biopsied.  2024  - Nodular mucosa in the ascending colon. Biopsied. - The examined portion of the ileum was normal. -  Non- bleeding external and internal hemorrhoids.  Suggested repeat 10 years   A. DUODENUM, BIOPSY:       Duodenal mucosa with foveolar metaplasia and Brunner gland  hyperplasia consistent with chronic peptic duodenitis.       Negative for dysplasia.   B. STOMACH, BIOPSY:       Gastric antral/oxyntic mucosa with no significant diagnostic  alteration.  No H.pylori identified on HE stain.  Negative for intestinal metaplasia or dysplasia.   C. STOMACH ULCER, BIOPSY:       Gastric antral mucosa with no significant diagnostic alteration.  No H.pylori identified on HE stain.  Negative for intestinal metaplasia or dysplasia.   D. COLON, RIGHT, BIOPSY:       Collagenous colitis.       Negative for dysplasia or malignancy.   E. COLON, LEFT, BIOPSY:       Collagenous colitis.    2017 The mucosa of the esophagus appeared normal 2. Large ulcer was found in the gastric antrum ( source of recent UGI bleeding) 3. The duodenal mucosa showed no abnormalities in the bulb and 2nd part of the duodenum   Last Colonoscopy2015 Normal colon. No suggested repeat indicated   Labs from March 2024 with normal liver enzymes , ferritin 32 with iron saturation of 14 hemoglobin A1c 6.2 TSH 1.4 hemoglobin 13.4 MCV 95 platelet 219 FHx: neg for any gastrointestinal/liver disease, no malignancies Social: Current cigarette smoker, occasional alcohol  Surgical: Hysterectomy  Past Medical History: Past Medical History:  Diagnosis Date   Acute alcoholic pancreatitis    Adrenal adenoma 02/2015   left, seen on  CT.    Anemia 07/2010, 04/2015   normocytic.    Asthma, extrinsic, without status asthmaticus 07/09/2022   Atrial fibrillation (HCC)    Depression with anxiety 2008   Diabetes mellitus 2011   type 2.    Duodenitis 2005   HH also seen on EGD.    Dyslipidemia 2011   Expressive aphasia 03/2010   subjective word finding difficulty and slurred speach.  MRI/MRA, CT scan: mild small vessel disease, mild  narrowing bilateral int carotids and RCA   Gastric ulcer    Hydronephrosis 2010   per ultrasound: mild, chronic, non-obstructing, bilateral.   Hypertension 2011   IBS (irritable bowel syndrome) 2008   diarrhea prone   Mitral valve regurgitation 03/2010   Mild MVR, EF 60-65% per echo.   Multiple gastric ulcers 09/2013   Benign, due to ASA powders.  H Pylori negative.     Pancreatitis    PVD (peripheral vascular disease) (HCC)    Renal insufficiency 2014   AKI 10/2012 due to dehydration, acute (probably infectious/viral) colitis, ACE use.    Severe protein-calorie malnutrition (HCC) 10/2012   Tibia/fibula fracture 04/2015   left.  Dital tibia, proximal fibula.     Past Surgical History: Past Surgical History:  Procedure Laterality Date   ABDOMINAL HYSTERECTOMY     BIOPSY  02/12/2023   Procedure: BIOPSY;  Surgeon: Hargis Lias, MD;  Location: [facility];  Service: Endoscopy;;   BONE MARROW BIOPSY  07/2010.    Benign fibroconnective and soft tissue with abundant acute and chronic inflammation, with foreign body multinucleated giant cell reaction. No atypia, no malignancy   colonoscopies  2008, 09/2013   both normal.    COLONOSCOPY WITH PROPOFOL  N/A 02/12/2023   Procedure: COLONOSCOPY WITH PROPOFOL ;  Surgeon: Hargis Lias, MD;  Location: [facility];  Service: Endoscopy;  Laterality: N/A;  1:45PM;ASA 1-2   ESOPHAGOGASTRODUODENOSCOPY  2005, 09/2013, 10/2013   ESOPHAGOGASTRODUODENOSCOPY (EGD) WITH PROPOFOL  N/A 04/13/2015   Procedure: ESOPHAGOGASTRODUODENOSCOPY (EGD) WITH PROPOFOL ;  Surgeon: Nannette Babe, MD;  Location: [facility];  Service: Endoscopy;  Laterality: N/A;   ESOPHAGOGASTRODUODENOSCOPY (EGD) WITH PROPOFOL  N/A 02/12/2023   Procedure: ESOPHAGOGASTRODUODENOSCOPY (EGD) WITH PROPOFOL ;  Surgeon: Hargis Lias, MD;  Location: [facility];  Service: Endoscopy;  Laterality: N/A;  1:45AM;ASA 1-2   ORIF NONUNION HUMERUS FRACTURE  07/2010   with bone graft.      Family History: Family History  Problem Relation Age of Onset   Stroke Mother    Kidney cancer Mother    Heart failure Father    Hypertension Father    Heart disease Father    Diabetes Father    Diabetes Sister    Hypertension Sister    Colon cancer Maternal Uncle     Social History: Social History   Tobacco Use  Smoking Status Every Day   Current packs/day: 1.50   Average packs/day: 1.5 packs/day for 20.0 years (30.0 ttl pk-yrs)   Types: Cigarettes   Passive exposure: Current  Smokeless Tobacco Never   Social History   Substance and Sexual Activity  Alcohol Use Yes   Comment: wine coolers occasional   Social History   Substance and Sexual Activity  Drug Use No    Allergies: No Known Allergies  Medications: Current Outpatient Medications  Medication Sig Dispense Refill   aspirin  EC 81 MG tablet Take 81 mg by mouth daily.       clonazePAM  (KLONOPIN ) 0.5 MG tablet Take 1 tablet (0.5 mg total)  by mouth 2 (two) times daily as needed. 60 tablet 0   cyclobenzaprine  (FLEXERIL ) 5 MG tablet Take 1 tablet (5 mg total) by mouth 2 (two) times daily as needed. for muscle spams 60 tablet 2   dicyclomine  (BENTYL ) 10 MG capsule Take 10 mg by mouth 4 (four) times daily.     hydrOXYzine  (VISTARIL ) 25 MG capsule Take 1 capsule (25 mg total) by mouth 2 (two) times daily as needed. 60 capsule 3   lisinopril  (ZESTRIL ) 10 MG tablet Take 1 tablet (10 mg total) by mouth daily. 90 tablet 1   meclizine  (ANTIVERT ) 12.5 MG tablet Take 1 tablet (12.5 mg total) by mouth 2 (two) times daily as needed for dizziness. 30 tablet 0   metoprolol  succinate (TOPROL -XL) 100 MG 24 hr tablet Take 1 tablet (100 mg total) by mouth daily. 30 tablet 3   pantoprazole  (PROTONIX ) 40 MG tablet Take 40 mg by mouth daily.     rosuvastatin  (CRESTOR ) 20 MG tablet Take 1 tablet (20 mg total) by mouth at bedtime. 90 tablet 1   traZODone  (DESYREL ) 50 MG tablet TAKE 1 TABLET BY MOUTH AT BEDTIME AS NEEDED FOR SLEEP  30 tablet 0   budesonide  (ENTOCORT EC ) 3 MG 24 hr capsule Take 3 capsules daily for 8 weeks, then 2 capsules daily for 2 weeks, then 1 capsule daily for 2 weeks then stop. (Patient not taking: Reported on 07/22/2023) 210 capsule 0   gabapentin (NEURONTIN) 100 MG capsule Take 100 mg by mouth 3 (three) times daily. (Patient not taking: Reported on 07/22/2023)     No current facility-administered medications for this visit.    Review of Systems: GENERAL: negative for malaise, night sweats HEENT: No changes in hearing or vision, no nose bleeds or other nasal problems. NECK: Negative for lumps, goiter, pain and significant neck swelling RESPIRATORY: Negative for cough, wheezing CARDIOVASCULAR: Negative for chest pain, leg swelling, palpitations, orthopnea GI: SEE HPI MUSCULOSKELETAL: Negative for joint pain or swelling, back pain, and muscle pain. SKIN: Negative for lesions, rash PSYCH: Negative for sleep disturbance, mood disorder and recent psychosocial stressors. HEMATOLOGY Negative for prolonged bleeding, bruising easily, and swollen nodes. ENDOCRINE: Negative for cold or heat intolerance, polyuria, polydipsia and goiter. NEURO: negative for tremor, gait imbalance, syncope and seizures. The remainder of the review of systems is noncontributory.   Physical Exam: No exam was performed as this was a telephone encounter  Imaging/Labs: as above  I personally reviewed and interpreted the available labs, imaging and endoscopic files.   Fecal Pancreatic Elastase 202 (Moderate Pancreatic Insufficiency:   100 - 200)   Impression and Plan:  LEOCADIA IDLEMAN is a 68 y.o. 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  Patient only risk factors for chronic pancreatitis is chronic smoking and has never been heavy alcohol use   Colonoscopy with random biopsies positive for microscopic colitis.  This is likely because of chronic NSAID and PPI use  Active microscopic colitis treatment remains removing offending agents(PPI, SSRI, NSAIDs) .Colonoscopy with lymphocytic colitis. Patient is having 2-3 liquid BM  daily which is an indication for treatment   Based on current guidelines patient to take , 72,000 units of lipase with meals, and 36,000 units with snacks.  The optimal timing is while consuming a meal and not before or after.   Will switch PPI to Pepcid , ( Patient has been still taking PPI)   Patient was able to tolerate budesonide  which is the gold standard treatment for microscopic colitis and will now initiate second line therapy with bismuth   Given history of chronic pancreatitis and new onset early satiety/Anorexia, will obtain MRI/ MRCP abdomen to evaluate pancreas as chronic pancreatitis is a risk factor for pancreatic malignancy  #Peptic ulcer disease #New dysphagia    In 2017 at [facility] GI patient had upper GI bleed found to have peptic ulcer disease in setting of NSAID   02/2023 upper endoscopy with non- bleeding gastric ulcer with a clean ulcer base ( Forrest Class III) . Biopsied. appears to be smaller than last exam in 2017    Patient advised to avoid NSAIDs and rather optimize taking Tylenol  less than 2-3 g a day  May consider repeat EGD in next few months with esophageal dilation   RTC 3 months   All questions were answered.      Keawe Marcello Faizan Gwendolyn Mclees, MD Gastroenterology and Hepatology [facility]

## 2023-07-22 NOTE — Telephone Encounter (Signed)
 Pt needing MRCP but needs Open MRI due to claustrophobia. [facility] does not have open MRI. Will fax order and PA to [facility] which is located on [address] [place]

## 2023-07-22 NOTE — Addendum Note (Signed)
 Addended by: Aldon Hengst on: 07/22/2023 12:08 PM   Modules accepted: Orders

## 2023-07-22 NOTE — Patient Instructions (Signed)
 It was very nice to meet you today, as dicussed with will plan for the following :  1) CREON   2) stop protonix  and start pepcid   3) Bismuth  for microscopic colitis   4) MRI

## 2023-07-26 NOTE — Telephone Encounter (Signed)
 Since acute issue, can schedule with Rice Chamorro next week if she has availability

## 2023-07-30 ENCOUNTER — Other Ambulatory Visit: Payer: Self-pay | Admitting: Family Medicine

## 2023-07-31 ENCOUNTER — Ambulatory Visit

## 2023-08-13 ENCOUNTER — Other Ambulatory Visit: Payer: Self-pay | Admitting: Family Medicine

## 2023-08-13 DIAGNOSIS — F5101 Primary insomnia: Secondary | ICD-10-CM

## 2023-08-21 ENCOUNTER — Ambulatory Visit: Admitting: Audiologist

## 2023-08-30 ENCOUNTER — Other Ambulatory Visit: Payer: Self-pay | Admitting: Family Medicine

## 2023-09-26 ENCOUNTER — Other Ambulatory Visit: Payer: Self-pay | Admitting: Family Medicine

## 2023-10-09 ENCOUNTER — Other Ambulatory Visit: Payer: Self-pay | Admitting: Family Medicine

## 2023-10-09 DIAGNOSIS — F5101 Primary insomnia: Secondary | ICD-10-CM

## 2023-10-14 ENCOUNTER — Ambulatory Visit: Payer: Self-pay

## 2023-10-14 NOTE — Telephone Encounter (Signed)
 FYI Only or Action Required?: Action required by provider: clinical question for provider.  Patient was last seen in primary care on 07/10/2023 by Terry Wilhelmena Lloyd Hilario, FNP.  Called Nurse Triage reporting Ear Problem.  Symptoms began a week ago.  Interventions attempted: Nothing.  Symptoms are: unchanged.  Triage Disposition: See PCP When Office is Open (Within 3 Days)  Patient/caregiver understands and will follow disposition?: No, refuses dispositionCopied from CRM [id number]. Topic: Clinical - Medication Question >> Oct 14, 2023 10:26 AM Montie POUR wrote: Reason for CRM:  Krista Carter would like to see if FNP Hilario would call her in allergy medicine. She is having headaches, pain in ears, tired, coughing a little bit  Please call her at [phone] to discuss Reason for Disposition  [1] Ear congestion lasts > 3 days AND [2] no improvement after using Care Advice  (Exception: Ear congestion is a chronic symptom.)  Answer Assessment - Initial Assessment Questions 1. LOCATION: Which ear is involved?       both 2. SENSATION: Describe how the ear feels. (e.g., stuffy, full, plugged).      Static sound 3. ONSET:  When did the ear symptoms start?       A week  4. PAIN: Do you also have an earache? If Yes, ask: How bad is it? (Scale 0-10; none, mild, moderate or severe)     No pain  5. CAUSE: What do you think is causing the ear congestion? (e.g., common cold, nasal allergies, recent flight, recent snorkeling)     allergies 6. OTHER SYMPTOMS: Do you have any other symptoms? (e.g., ear drainage, hay fever symptoms such as sneezing or a clear nasal discharge; cold symptoms such as a cough or runny nose)     Cough, headache, tired   Pt is requesting an allergy medication to be called in . Pt is declining appt at this time. Pt stated she use to be on allergy medication but doesn't remember name. Pt has not tried any OTC medication. RN suggested to get OTC zyrtec until PCP can  review call.  Please advice.  Protocols used: Ear - Congestion-A-AH

## 2023-10-30 ENCOUNTER — Other Ambulatory Visit: Payer: Self-pay | Admitting: Family Medicine

## 2023-11-04 ENCOUNTER — Other Ambulatory Visit: Payer: Self-pay | Admitting: *Deleted

## 2023-11-04 ENCOUNTER — Telehealth: Payer: Self-pay | Admitting: *Deleted

## 2023-11-04 ENCOUNTER — Ambulatory Visit (INDEPENDENT_AMBULATORY_CARE_PROVIDER_SITE_OTHER): Admitting: Gastroenterology

## 2023-11-04 ENCOUNTER — Encounter (INDEPENDENT_AMBULATORY_CARE_PROVIDER_SITE_OTHER): Payer: Self-pay | Admitting: Gastroenterology

## 2023-11-04 ENCOUNTER — Encounter: Payer: Self-pay | Admitting: *Deleted

## 2023-11-04 ENCOUNTER — Other Ambulatory Visit: Payer: Self-pay | Admitting: Family Medicine

## 2023-11-04 VITALS — BP 145/83 | HR 82 | Temp 98.1°F | Ht 64.0 in | Wt 144.9 lb

## 2023-11-04 DIAGNOSIS — K52839 Microscopic colitis, unspecified: Secondary | ICD-10-CM

## 2023-11-04 DIAGNOSIS — K8681 Exocrine pancreatic insufficiency: Secondary | ICD-10-CM

## 2023-11-04 DIAGNOSIS — K52831 Collagenous colitis: Secondary | ICD-10-CM

## 2023-11-04 DIAGNOSIS — K219 Gastro-esophageal reflux disease without esophagitis: Secondary | ICD-10-CM | POA: Diagnosis not present

## 2023-11-04 DIAGNOSIS — K529 Noninfective gastroenteritis and colitis, unspecified: Secondary | ICD-10-CM

## 2023-11-04 DIAGNOSIS — K279 Peptic ulcer, site unspecified, unspecified as acute or chronic, without hemorrhage or perforation: Secondary | ICD-10-CM

## 2023-11-04 DIAGNOSIS — I10 Essential (primary) hypertension: Secondary | ICD-10-CM

## 2023-11-04 DIAGNOSIS — K259 Gastric ulcer, unspecified as acute or chronic, without hemorrhage or perforation: Secondary | ICD-10-CM

## 2023-11-04 DIAGNOSIS — K861 Other chronic pancreatitis: Secondary | ICD-10-CM

## 2023-11-04 MED ORDER — OMEPRAZOLE 40 MG PO CPDR: 40.0000 mg | DELAYED_RELEASE_CAPSULE | Freq: Every day | ORAL | 1 refills | Status: AC

## 2023-11-04 MED ORDER — BISMUTH 262 MG PO CHEW: 524.0000 mg | CHEWABLE_TABLET | Freq: Three times a day (TID) | ORAL | 1 refills | Status: AC

## 2023-11-04 NOTE — Telephone Encounter (Signed)
 [facility] PA for MRI: Case Number: [id number] Review Date: 11/04/2023 11:45:56 AM Expiration Date: N/A Status: This member's benefit plan did not require a prior authorization for this request.

## 2023-11-04 NOTE — Patient Instructions (Signed)
-  resume creon  2 with meals, 1 with snacks -bismuth  2 tablets morning, noon, night time -schedule MRI of the pancreas, you will need to take your anxiety medication 45-60 minutes prior to the exam, you will need someone to drive you for this as well  -start omeprazole  40mg  daily  -strict avoidance of ALL NSAIDs, especially BC powders (ibuprofen , aleve, advil , goody powders, naproxyn)  -please discuss other pain alternatives/pain management with your PCP  -repeat Colonoscopy and EGD in November  Follow up 2 months  It was a pleasure to see you today. I want to create trusting relationships with patients and provide genuine, compassionate, and quality care. I truly value your feedback! please be on the lookout for a survey regarding your visit with me today. I appreciate your input about our visit and your time in completing this!    Krista Preis L. Laketa Sandoz, MSN, APRN, AGNP-C Adult-Gerontology Nurse Practitioner [facility]

## 2023-11-04 NOTE — Telephone Encounter (Signed)
 Pt informed of CT appt. She states she got the message on mychart.

## 2023-11-04 NOTE — Progress Notes (Signed)
 Referring Provider: Del Orbe Polanco, Ilian* Primary Care Physician:  Terry Wilhelmena Lloyd Hilario, FNP Primary GI Physician: Dr. Cinderella   Chief Complaint  Patient presents with   Follow-up    Pt arrives for follow up. Pt states she went out of town to [place]  for 2 weeks and vomiting 2 mornings in a row. Pt has began to have diarrhea, will go about 4-5 times a day. Pt also reports cramping. Also having more heartburn than normal. Decreased appetite.    HPI:   Krista Carter is a 68 y.o. 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 taking protonix  for GERD, some dysphagia recently   Recommended to start bismuth , MRCP, avoid NSAIDs, SSRI, continue pancrealipase, start pepcid   Present: States she is not taking creon  as she is not eating more than a bite or two recently. Her appetite is decreased, this has been ongoing for a while. She was in [place]  recently and had some vomiting for a few days, endorses abdominal cramping, nausea. She has had more reflux recently since her PPI was stopped. She was taking pepcid  but felt this made her GERD worse. Not really taking creon  due to not eating much. Have 4-5 BMs per day, which are diarrhea with urgency. Swallowing is doing better,denies dysphagia. No rectal bleeding or melena. She is unsure what happened with the MRCP that was ordered as she never heard anything more about this being done, she notes she does have issues with transportation. She has had closed MRI in the past and has anxiety medication she can take prior to the exam if needed. She is taking BC powder  almost daily, taking ibuprofen  maybe every 2 weeks. Reports she was never explained how detrimental NSAIDs/ BC powders were to her PUD. Has had difficulty managing her headaches/ knee pain. Has upcoming appt with PCP next week. Did not get bismuth  that was recommended at last visit, she is unsure why. Continues to smoke cigarettes, drinks ETOH only on occasion. Endorses upper abdominal/epigastric pain when pain occurs. No rectal bleeding, melena, coffee ground emesis, weight loss.    Last EGD:02/2023 - Normal esophagus. - Esophagogastric landmarks identified. - 3 cm hiatal hernia. - Non- bleeding gastric ulcer with a clean ulcer base (Forrest Class III) . Biopsied. appears to be smaller than last exam in 2017 - Erythematous mucosa in the stomach. Biopsied. - Normal duodenal bulb and second portion of the duodenum. Biopsied. Last Colonoscopy 02/2023  - Nodular mucosa in the ascending colon. Biopsied. - The examined portion of the ileum was normal. - Non- bleeding external and internal hemorrhoids.   Suggested repeat 10 years    A. DUODENUM, BIOPSY:       Duodenal mucosa with foveolar metaplasia and Brunner gland  hyperplasia consistent with chronic peptic duodenitis.       Negative for dysplasia.   B. STOMACH, BIOPSY:       Gastric antral/oxyntic mucosa with no significant diagnostic  alteration.  No H.pylori identified on HE stain.  Negative for intestinal metaplasia or dysplasia.   C. STOMACH ULCER, BIOPSY:       Gastric antral mucosa with  no significant diagnostic alteration.  No H.pylori identified on HE stain.  Negative for intestinal metaplasia or dysplasia.   D. COLON, RIGHT, BIOPSY:       Collagenous colitis.       Negative for dysplasia or malignancy.   E. COLON, LEFT, BIOPSY:       Collagenous colitis.      Filed Weights   11/04/23 1048  Weight: 144 lb 14.4 oz (65.7 kg)     Past Medical History:  Diagnosis Date   Acute alcoholic pancreatitis    Adrenal adenoma  02/2015   left, seen on CT.    Anemia 07/2010, 04/2015   normocytic.    Asthma, extrinsic, without status asthmaticus 07/09/2022   Atrial fibrillation (HCC)    Depression with anxiety 2008   Diabetes mellitus 2011   type 2.    Duodenitis 2005   HH also seen on EGD.    Dyslipidemia 2011   Expressive aphasia 03/2010   subjective word finding difficulty and slurred speach.  MRI/MRA, CT scan: mild small vessel disease, mild narrowing bilateral int carotids and RCA   Gastric ulcer    Hydronephrosis 2010   per ultrasound: mild, chronic, non-obstructing, bilateral.   Hypertension 2011   IBS (irritable bowel syndrome) 2008   diarrhea prone   Mitral valve regurgitation 03/2010   Mild MVR, EF 60-65% per echo.   Multiple gastric ulcers 09/2013   Benign, due to ASA powders.  H Pylori negative.     Pancreatitis    PVD (peripheral vascular disease) (HCC)    Renal insufficiency 2014   AKI 10/2012 due to dehydration, acute (probably infectious/viral) colitis, ACE use.    Severe protein-calorie malnutrition (HCC) 10/2012   Tibia/fibula fracture 04/2015   left.  Dital tibia, proximal fibula.     Past Surgical History:  Procedure Laterality Date   ABDOMINAL HYSTERECTOMY     BIOPSY  02/12/2023   Procedure: BIOPSY;  Surgeon: Cinderella Deatrice FALCON, MD;  Location: [facility];  Service: Endoscopy;;   BONE MARROW BIOPSY  07/2010.    Benign fibroconnective and soft tissue with abundant acute and chronic inflammation, with foreign body multinucleated giant cell reaction. No atypia, no malignancy   colonoscopies  2008, 09/2013   both normal.    COLONOSCOPY WITH PROPOFOL  N/A 02/12/2023   Procedure: COLONOSCOPY WITH PROPOFOL ;  Surgeon: Cinderella Deatrice FALCON, MD;  Location: [facility];  Service: Endoscopy;  Laterality: N/A;  1:45PM;ASA 1-2   ESOPHAGOGASTRODUODENOSCOPY  2005, 09/2013, 10/2013   ESOPHAGOGASTRODUODENOSCOPY (EGD) WITH PROPOFOL  N/A 04/13/2015   Procedure: ESOPHAGOGASTRODUODENOSCOPY (EGD) WITH  PROPOFOL ;  Surgeon: Gordy CHRISTELLA Starch, MD;  Location: [facility];  Service: Endoscopy;  Laterality: N/A;   ESOPHAGOGASTRODUODENOSCOPY (EGD) WITH PROPOFOL  N/A 02/12/2023   Procedure: ESOPHAGOGASTRODUODENOSCOPY (EGD) WITH PROPOFOL ;  Surgeon: Cinderella Deatrice FALCON, MD;  Location: [facility];  Service: Endoscopy;  Laterality: N/A;  1:45AM;ASA 1-2   ORIF NONUNION HUMERUS FRACTURE  07/2010   with bone graft.     Current Outpatient Medications  Medication Sig Dispense Refill   aspirin  EC 81 MG tablet Take 81 mg by mouth daily.       budesonide  (ENTOCORT EC ) 3 MG 24 hr capsule Take 3 capsules daily for 8 weeks, then 2 capsules daily for 2 weeks, then 1 capsule daily for 2 weeks then stop. 210 capsule 0   clonazePAM  (KLONOPIN ) 0.5 MG tablet Take 1 tablet by mouth twice daily as needed 60 tablet 0   cyclobenzaprine  (FLEXERIL ) 5 MG tablet Take  1 tablet (5 mg total) by mouth 2 (two) times daily as needed. for muscle spams 60 tablet 2   dicyclomine  (BENTYL ) 10 MG capsule Take 10 mg by mouth 4 (four) times daily.     famotidine  (PEPCID ) 20 MG tablet Take 1 tablet (20 mg total) by mouth at bedtime. 90 tablet 0   gabapentin (NEURONTIN) 100 MG capsule Take 100 mg by mouth 3 (three) times daily.     hydrOXYzine  (VISTARIL ) 25 MG capsule Take 1 capsule (25 mg total) by mouth 2 (two) times daily as needed. 60 capsule 3   lipase/protease/amylase (CREON ) 36000 UNITS CPEP capsule Take 2 capsules (72,000 Units total) by mouth 3 (three) times daily with meals. May also take 1 capsule (36,000 Units total) as needed (with snacks - up to 4 snacks daily). 300 capsule 11   lisinopril  (ZESTRIL ) 10 MG tablet Take 1 tablet (10 mg total) by mouth daily. 90 tablet 1   meclizine  (ANTIVERT ) 12.5 MG tablet Take 1 tablet (12.5 mg total) by mouth 2 (two) times daily as needed for dizziness. 30 tablet 0   metoprolol  succinate (TOPROL -XL) 100 MG 24 hr tablet Take 1 tablet (100 mg total) by mouth daily. 30 tablet 3   rosuvastatin  (CRESTOR ) 20  MG tablet Take 1 tablet (20 mg total) by mouth at bedtime. 90 tablet 1   traZODone  (DESYREL ) 50 MG tablet TAKE 1 TABLET BY MOUTH AT BEDTIME AS NEEDED FOR SLEEP 30 tablet 0   No current facility-administered medications for this visit.    Allergies as of 11/04/2023   (No Known Allergies)    Social History   Socioeconomic History   Marital status: Widowed    Spouse name: Not on file   Number of children: 2   Years of education: Not on file   Highest education level: Not on file  Occupational History   Occupation: Retired  Tobacco Use   Smoking status: Every Day    Current packs/day: 1.50    Average packs/day: 1.5 packs/day for 20.0 years (30.0 ttl pk-yrs)    Types: Cigarettes    Passive exposure: Current   Smokeless tobacco: Never  Vaping Use   Vaping status: Never Used  Substance and Sexual Activity   Alcohol use: Yes    Comment: wine coolers occasional   Drug use: No   Sexual activity: Yes    Birth control/protection: None  Other Topics Concern   Not on file  Social History Narrative   Not on file   Social Drivers of Health   Financial Resource Strain: Medium Risk (10/19/2021)   Received from [facility]   Overall Financial Resource Strain (CARDIA)    Difficulty of Paying Living Expenses: Somewhat hard  Food Insecurity: No Food Insecurity (10/19/2021)   Received from [facility]   Hunger Vital Sign    Within the past 12 months, you worried that your food would run out before you got the money to buy more.: Never true    Within the past 12 months, the food you bought just didn't last and you didn't have money to get more.: Never true  Transportation Needs: Unmet Transportation Needs (10/19/2021)   Received from [facility] - Transportation    Lack of Transportation (Medical): No    Lack of Transportation (Non-Medical): Yes  Physical Activity: Insufficiently Active (10/19/2021)   Received from [facility]   Exercise Vital Sign    On average, how  many days per week do you engage in moderate  to strenuous exercise (like a brisk walk)?: 1 day    On average, how many minutes do you engage in exercise at this level?: 30 min  Stress: Stress Concern Present (10/19/2021)   Received from [facility] - Occupational Stress Questionnaire    Feeling of Stress : Very much  Social Connections: Unknown (10/31/2022)   Received from [facility]   Social Network    Social Network: Not on file    Review of systems General: negative for malaise, night sweats, fever, chills, weight los Neck: Negative for lumps, goiter, pain and significant neck swelling Resp: Negative for cough, wheezing, dyspnea at rest CV: Negative for chest pain, leg swelling, palpitations, orthopnea GI: denies melena, hematochezia, nausea, vomiting, diarrhea, constipation, dysphagia, odyonophagia, early satiety or unintentional weight loss.  MSK: Negative for joint pain or swelling, back pain, and muscle pain. Derm: Negative for itching or rash Psych: Denies depression, anxiety, memory loss, confusion. No homicidal or suicidal ideation.  Heme: Negative for prolonged bleeding, bruising easily, and swollen nodes. Endocrine: Negative for cold or heat intolerance, polyuria, polydipsia and goiter. Neuro: negative for tremor, gait imbalance, syncope and seizures. The remainder of the review of systems is noncontributory.  Physical Exam: BP (!) 145/83   Pulse 82   Temp 98.1 F (36.7 C)   Ht 5' 4 (1.626 m)   Wt 144 lb 14.4 oz (65.7 kg)   BMI 24.87 kg/m  General:   Alert and oriented. No distress noted. Pleasant and cooperative.  Head:  Normocephalic and atraumatic. Eyes:  Conjuctiva clear without scleral icterus. Mouth:  Oral mucosa pink and moist. Good dentition. No lesions. Heart: Normal rate and rhythm, s1 and s2 heart sounds present.  Lungs: Clear lung sounds in all lobes. Respirations equal and unlabored. Abdomen:  +BS, soft,  non-tender and non-distended. No rebound or guarding. No HSM or masses noted. Derm: No palmar erythema or jaundice Msk:  Symmetrical without gross deformities. Normal posture. Extremities:  Without edema. Neurologic:  Alert and  oriented x4 Psych:  Alert and cooperative. Normal mood and affect.  Invalid input(s): 6 MONTHS   ASSESSMENT: ANGLA DELAHUNT is a 68 y.o. female presenting today for follow up of PUD/GERD, chronic pancreatitis/EPI and microscopic colitis  PUD/GERD: last EGD November 2024 with non bleeding gastric ulcer noted, appeared smaller than on previous exam in 2017. She was switched from protonix  to pepcid  at last visit due to concern for PPI worsening her microscopic colitis though she notes she felt pepcid  made her GERD worse. Not taking anything currently. She endorses to me using BC powders almost daily, ibuprofen  a few times per month, she is still smoking cigarettes as well. I had a thorough discussion with the patient, explaining in depth the adverse effects of chronic NSAIDs, especially BC powders on her GI tract, and even more pre-disposal to adverse events in regards to her history of PUD. I strongly recommended she stop ALL NSAID use immediately. We discussed possible GI bleeding/perforation, long term effects of NSAIDs on the GI tract. Patient stated she was unaware how detrimental these were and verbalized understanding. I recommend she discuss alternative pain therapies with PCP. Will resume PPI therapy for now given her PUD/GERD. I would consider repeat EGD in November at time of repeat TCS, will discuss with Dr. Cinderella to ensure he is in agreement with this timing. She has no Melena/coffee ground emesis or red flag symptoms which would require more urgent EGD.  Chronic pancreatitis:  CT findings of calcification seen on pancreas and worsening postprandial diarrhea, warranted EPI diagnosis. Had some improvement previously with creon  though due to decreased appetite recently  not really taking this. We discussed the importance of smoking cessation and proper dosing of creon  to be taken with meals, even if she is eating a small amount. She as recommended previously to have MRCP due to chronic pancreatitis, decreased appetite, anorexia, though this was not completed. She is okay to do enclosed MRI despite claustrophobia, as she is prescribed anti anxiety medication she will take 45-60 minutes prior to the exam, we discussed she will need transportation given she is taking this medication.  Microscopic colitis: did not tolerate budesonide  due to hallucinations in the past. Diarrhea likely multifactorial in setting of EPI as above, not taking her creon  as prescribed currently. Was recommended to start bismuth  at last visit but unclear why she did not do this. As above, encouraged to take creon  even if eating small amounts and will resend bismuth  to try as well.    PLAN:  -resume creon  2 with meals, 1 with snacks (could consider increase in dose pending clinical course) -bismuth  2 tablets morning, noon, night time -schedule MRCP, can take anti anxiety meds 45-60 mins prior to exam due to claustrophobia  -start omeprazole  40mg  daily  -strict avoidance of ALL NSAIDs, especially BC powders -discuss alternate pain therapies/pain management with PCP  -repeat Colonoscopy and likely EGD in November, will discuss timing of EGD with Dr. Cinderella  All questions were answered, patient verbalized understanding and is in agreement with plan as outlined above.    Follow Up: 2 months   Krista Carter L. Sitlali Koerner, MSN, APRN, AGNP-C Adult-Gerontology Nurse Practitioner [facility]

## 2023-11-05 ENCOUNTER — Other Ambulatory Visit: Payer: Self-pay | Admitting: Family Medicine

## 2023-11-05 DIAGNOSIS — F5101 Primary insomnia: Secondary | ICD-10-CM

## 2023-11-06 ENCOUNTER — Other Ambulatory Visit: Payer: Self-pay | Admitting: Family Medicine

## 2023-11-06 ENCOUNTER — Ambulatory Visit (HOSPITAL_COMMUNITY)
Admission: RE | Admit: 2023-11-06 | Discharge: 2023-11-06 | Disposition: A | Discharge status: Home / Self Care | Source: Ambulatory Visit | Attending: Gastroenterology | Admitting: Gastroenterology

## 2023-11-06 ENCOUNTER — Other Ambulatory Visit: Payer: Self-pay | Admitting: Gastroenterology

## 2023-11-06 DIAGNOSIS — K8681 Exocrine pancreatic insufficiency: Secondary | ICD-10-CM

## 2023-11-06 DIAGNOSIS — F419 Anxiety disorder, unspecified: Secondary | ICD-10-CM

## 2023-11-06 DIAGNOSIS — K52831 Collagenous colitis: Secondary | ICD-10-CM | POA: Insufficient documentation

## 2023-11-06 DIAGNOSIS — N1330 Unspecified hydronephrosis: Secondary | ICD-10-CM | POA: Diagnosis not present

## 2023-11-06 DIAGNOSIS — K861 Other chronic pancreatitis: Secondary | ICD-10-CM | POA: Diagnosis not present

## 2023-11-06 DIAGNOSIS — K8591 Acute pancreatitis with uninfected necrosis, unspecified: Secondary | ICD-10-CM | POA: Diagnosis not present

## 2023-11-06 DIAGNOSIS — R109 Unspecified abdominal pain: Secondary | ICD-10-CM | POA: Diagnosis not present

## 2023-11-06 MED ORDER — GADOBUTROL 1 MMOL/ML IV SOLN
6.0000 mL | Freq: Once | INTRAVENOUS | Status: AC | PRN
  Administered 2023-11-06: 6 mL via INTRAVENOUS

## 2023-11-08 ENCOUNTER — Encounter: Payer: Self-pay | Admitting: Family Medicine

## 2023-11-08 ENCOUNTER — Ambulatory Visit (INDEPENDENT_AMBULATORY_CARE_PROVIDER_SITE_OTHER): Admitting: Family Medicine

## 2023-11-08 ENCOUNTER — Encounter: Payer: Self-pay | Admitting: Emergency Medicine

## 2023-11-08 VITALS — BP 138/84 | HR 83 | Ht 64.0 in | Wt 143.0 lb

## 2023-11-08 DIAGNOSIS — R7303 Prediabetes: Secondary | ICD-10-CM

## 2023-11-08 DIAGNOSIS — E038 Other specified hypothyroidism: Secondary | ICD-10-CM

## 2023-11-08 DIAGNOSIS — M25561 Pain in right knee: Secondary | ICD-10-CM | POA: Diagnosis not present

## 2023-11-08 DIAGNOSIS — E2839 Other primary ovarian failure: Secondary | ICD-10-CM

## 2023-11-08 DIAGNOSIS — I10 Essential (primary) hypertension: Secondary | ICD-10-CM

## 2023-11-08 DIAGNOSIS — M25562 Pain in left knee: Secondary | ICD-10-CM

## 2023-11-08 DIAGNOSIS — Z122 Encounter for screening for malignant neoplasm of respiratory organs: Secondary | ICD-10-CM

## 2023-11-08 MED ORDER — CYCLOBENZAPRINE HCL 10 MG PO TABS: 10.0000 mg | ORAL_TABLET | Freq: Two times a day (BID) | ORAL | 2 refills | Status: DC | PRN

## 2023-11-08 NOTE — Progress Notes (Signed)
 Established Patient Office Visit   Subjective  Patient ID: Krista Carter, female    DOB: [birth year]  Age: 68 y.o. MRN: [id number]  Chief Complaint  Patient presents with   Care Management    Follow up   Knee Pain    Pain in knees with swelling, difficult to walk     She  has a past medical history of Acute alcoholic pancreatitis, Adrenal adenoma (02/2015), Anemia (07/2010, 04/2015), Asthma, extrinsic, without status asthmaticus (07/09/2022), Atrial fibrillation (HCC), Depression with anxiety (2008), Diabetes mellitus (2011), Duodenitis (2005), Dyslipidemia (2011), Expressive aphasia (03/2010), Gastric ulcer, Hydronephrosis (2010), Hypertension (2011), IBS (irritable bowel syndrome) (2008), Mitral valve regurgitation (03/2010), Multiple gastric ulcers (09/2013), Pancreatitis, PVD (peripheral vascular disease) (HCC), Renal insufficiency (2014), Severe protein-calorie malnutrition (HCC) (10/2012), and Tibia/fibula fracture (04/2015).  The patient reports worsening knee pain that began approximately one year ago, now accompanied by swelling and increasing difficulty with walking. There was no known injury or trauma. Pain is localized to the right knee and left ankle, described as aching, stabbing, and cramping, with a severity of 10/10. The pain has been constant since onset. Associated symptoms include inability to bear weight, muscle weakness, numbness, and tingling. The patient denies any foreign body presence. Symptoms are aggravated by movement, palpation, and weight-bearing. She has attempted rest, acetaminophen , and avoiding weight-bearing, but reports no relief with these measures.    Review of Systems  Constitutional:  Negative for chills and fever.  Respiratory:  Negative for shortness of breath.   Cardiovascular:  Negative for chest pain.  Musculoskeletal:  Positive for joint pain and myalgias.       Bilateral knee pain      Objective:     BP 138/84   Pulse 83   Ht 5' 4 (1.626  m)   Wt 143 lb (64.9 kg)   SpO2 94%   BMI 24.55 kg/m  BP Readings from Last 3 Encounters:  11/08/23 138/84  11/04/23 (!) 145/83  07/22/23 (!) 154/94      Physical Exam Vitals reviewed.  Constitutional:      General: She is not in acute distress.    Appearance: Normal appearance. She is not ill-appearing, toxic-appearing or diaphoretic.  HENT:     Head: Normocephalic.  Eyes:     General:        Right eye: No discharge.        Left eye: No discharge.     Conjunctiva/sclera: Conjunctivae normal.  Cardiovascular:     Rate and Rhythm: Normal rate.     Pulses: Normal pulses.     Heart sounds: Normal heart sounds.  Pulmonary:     Effort: Pulmonary effort is normal. No respiratory distress.     Breath sounds: Normal breath sounds.  Abdominal:     General: Bowel sounds are normal.     Palpations: Abdomen is soft.     Tenderness: There is no abdominal tenderness. There is no guarding.  Musculoskeletal:        General: Swelling and tenderness present. Normal range of motion.  Neurological:     Mental Status: She is alert.  Psychiatric:        Mood and Affect: Mood normal.        Behavior: Behavior normal.      No results found for any visits on 11/08/23.  The 10-year ASCVD risk score (Arnett DK, et al., 2019) is: 13.8%    Assessment & Plan:  Acute pain of both knees Assessment & Plan:  Xray ordered  Advise patient treatment plan for knee pain includes rest, applying ice for 15-20 minutes several times a day, and gentle stretching and strengthening exercises to improve muscle support. Possible Physical therapy referral may help with proper technique. Using a knee brace or compression bandage can provide support, and elevating the leg helps reduce swelling. Low-impact exercises like swimming or cycling should be introduced gradually, and maintaining a healthy weight can prevent future pain.   Orders: -     DG Knee Complete 4 Views Left; Future -     DG Knee Complete 4  Views Right; Future  Estrogen deficiency -     DG Bone Density; Future  Screening for lung cancer -     Ambulatory Referral for Lung Cancer Scre  Prediabetes -     Hemoglobin A1c  Primary hypertension -     BMP8+eGFR -     CBC with Differential/Platelet -     Lipid panel  TSH (thyroid -stimulating hormone deficiency) -     TSH + free T4  Essential hypertension Assessment & Plan: Continue Lisinopril  10 mg and metoprolol  100 mg daily  Labs ordered. Continued discussion on DASH diet, low sodium diet and maintain a exercise routine for 150 minutes per week.    Other orders -     Cyclobenzaprine  HCl; Take 1 tablet (10 mg total) by mouth 2 (two) times daily as needed for muscle spasms.  Dispense: 60 tablet; Refill: 2    Return in about 4 months (around 03/09/2024), or if symptoms worsen or fail to improve, for chronic follow-up.   Hilario Kidd Wilhelmena Falter, FNP

## 2023-11-08 NOTE — Patient Instructions (Signed)

## 2023-11-08 NOTE — Assessment & Plan Note (Signed)
 Xray ordered Advise patient treatment plan for knee pain includes rest, applying ice for 15-20 minutes several times a day, and gentle stretching and strengthening exercises to improve muscle support. Possible Physical therapy referral may help with proper technique. Using a knee brace or compression bandage can provide support, and elevating the leg helps reduce swelling. Low-impact exercises like swimming or cycling should be introduced gradually, and maintaining a healthy weight can prevent future pain.

## 2023-11-08 NOTE — Assessment & Plan Note (Signed)
 Continue Lisinopril  10 mg and metoprolol  100 mg daily  Labs ordered. Continued discussion on DASH diet, low sodium diet and maintain a exercise routine for 150 minutes per week.

## 2023-11-09 LAB — LIPID PANEL
Chol/HDL Ratio: 2 ratio (ref 0.0–4.4)
Cholesterol, Total: 162 mg/dL (ref 100–199)
HDL: 80 mg/dL (ref >39)
LDL Chol Calc (NIH): 63 mg/dL (ref 0–99)
Triglycerides: 107 mg/dL (ref 0–149)
VLDL Cholesterol Cal: 19 mg/dL (ref 5–40)

## 2023-11-09 LAB — BMP8+EGFR
BUN/Creatinine Ratio: 15 (ref 12–28)
BUN: 10 mg/dL (ref 8–27)
CO2: 22 mmol/L (ref 20–29)
Calcium: 9.8 mg/dL (ref 8.7–10.3)
Chloride: 100 mmol/L (ref 96–106)
Creatinine, Ser: 0.67 mg/dL (ref 0.57–1.00)
Glucose: 109 mg/dL — ABNORMAL HIGH (ref 70–99)
Potassium: 4.1 mmol/L (ref 3.5–5.2)
Sodium: 138 mmol/L (ref 134–144)
eGFR: 96 mL/min/1.73 (ref >59)

## 2023-11-09 LAB — CBC WITH DIFFERENTIAL/PLATELET
Basophils Absolute: 0.1 x10E3/uL (ref 0.0–0.2)
Basos: 1 %
EOS (ABSOLUTE): 0.1 x10E3/uL (ref 0.0–0.4)
Eos: 1 %
Hematocrit: 40.7 % (ref 34.0–46.6)
Hemoglobin: 13.6 g/dL (ref 11.1–15.9)
Immature Grans (Abs): 0 x10E3/uL (ref 0.0–0.1)
Immature Granulocytes: 0 %
Lymphocytes Absolute: 1.5 x10E3/uL (ref 0.7–3.1)
Lymphs: 18 %
MCH: 33.3 pg — ABNORMAL HIGH (ref 26.6–33.0)
MCHC: 33.4 g/dL (ref 31.5–35.7)
MCV: 100 fL — ABNORMAL HIGH (ref 79–97)
Monocytes Absolute: 0.5 x10E3/uL (ref 0.1–0.9)
Monocytes: 5 %
Neutrophils Absolute: 6.3 x10E3/uL (ref 1.4–7.0)
Neutrophils: 75 %
Platelets: 286 x10E3/uL (ref 150–450)
RBC: 4.08 x10E6/uL (ref 3.77–5.28)
RDW: 13.7 % (ref 11.7–15.4)
WBC: 8.5 x10E3/uL (ref 3.4–10.8)

## 2023-11-09 LAB — HEMOGLOBIN A1C
Est. average glucose Bld gHb Est-mCnc: 123 mg/dL
Hgb A1c MFr Bld: 5.9 % — ABNORMAL HIGH (ref 4.8–5.6)

## 2023-11-09 LAB — TSH+FREE T4
Free T4: 1.02 ng/dL (ref 0.82–1.77)
TSH: 0.642 u[IU]/mL (ref 0.450–4.500)

## 2023-11-12 ENCOUNTER — Ambulatory Visit (INDEPENDENT_AMBULATORY_CARE_PROVIDER_SITE_OTHER): Payer: Self-pay | Admitting: Gastroenterology

## 2023-11-12 ENCOUNTER — Other Ambulatory Visit (INDEPENDENT_AMBULATORY_CARE_PROVIDER_SITE_OTHER): Payer: Self-pay | Admitting: Gastroenterology

## 2023-11-12 DIAGNOSIS — K861 Other chronic pancreatitis: Secondary | ICD-10-CM

## 2023-11-12 NOTE — Progress Notes (Signed)
 1 yr MRCP noted in recall Note sent to [facility] for EUS

## 2023-11-13 ENCOUNTER — Ambulatory Visit: Payer: Self-pay | Admitting: Family Medicine

## 2023-11-15 ENCOUNTER — Other Ambulatory Visit: Payer: Self-pay

## 2023-11-15 ENCOUNTER — Telehealth: Payer: Self-pay

## 2023-11-15 DIAGNOSIS — K8681 Exocrine pancreatic insufficiency: Secondary | ICD-10-CM

## 2023-11-15 DIAGNOSIS — K861 Other chronic pancreatitis: Secondary | ICD-10-CM

## 2023-11-15 NOTE — Telephone Encounter (Signed)
 EUS has been entered for 01/16/24 at 1230 pm at [facility] with GM

## 2023-11-15 NOTE — Telephone Encounter (Signed)
 Left message on machine to call back

## 2023-11-15 NOTE — Telephone Encounter (Signed)
-----   Message from [facility] sent at 11/15/2023  6:57 AM EDT ----- Reviewed MRI/MRCP. Longstanding issues of chronic pancreatitis and EPI. Upper EUS is valid and reasonable to pursue. Recommend primary team consider sending CA 19-9, I can be updated with those results when they return.  Krista Carter, Please schedule EUS with me as able.  If CA 19-9 returns and is elevated, we will adjust schedule accordingly. Thanks. GM ----- Message ----- From: Anitra Odetta CROME, RN Sent: 11/12/2023   3:54 PM EDT To: Jenkins VEAR Juanda Aloha Wilhelmenia Mickey., MD   ----- Message ----- From: Blanca Jenkins VEAR Sent: 11/12/2023   3:00 PM EDT To: Odetta CROME Anitra, RN; Mitzie CROME Boettcher, NP   Walterine Odetta, our NP [facility] would like patient to have EUS with Dr Wilhelmenia - Diagnosis is pancreatic ductal dilation. Thanks, Jenkins

## 2023-11-18 ENCOUNTER — Telehealth (INDEPENDENT_AMBULATORY_CARE_PROVIDER_SITE_OTHER): Payer: Self-pay

## 2023-11-18 ENCOUNTER — Other Ambulatory Visit (INDEPENDENT_AMBULATORY_CARE_PROVIDER_SITE_OTHER): Payer: Self-pay | Admitting: Gastroenterology

## 2023-11-18 ENCOUNTER — Telehealth (INDEPENDENT_AMBULATORY_CARE_PROVIDER_SITE_OTHER): Payer: Self-pay | Admitting: Gastroenterology

## 2023-11-18 ENCOUNTER — Other Ambulatory Visit (INDEPENDENT_AMBULATORY_CARE_PROVIDER_SITE_OTHER): Payer: Self-pay

## 2023-11-18 DIAGNOSIS — K2980 Duodenitis without bleeding: Secondary | ICD-10-CM

## 2023-11-18 DIAGNOSIS — R112 Nausea with vomiting, unspecified: Secondary | ICD-10-CM

## 2023-11-18 DIAGNOSIS — K861 Other chronic pancreatitis: Secondary | ICD-10-CM | POA: Diagnosis not present

## 2023-11-18 DIAGNOSIS — K8681 Exocrine pancreatic insufficiency: Secondary | ICD-10-CM | POA: Diagnosis not present

## 2023-11-18 DIAGNOSIS — R197 Diarrhea, unspecified: Secondary | ICD-10-CM | POA: Diagnosis not present

## 2023-11-18 NOTE — Telephone Encounter (Signed)
 I spoke with the patient and made her aware per Krista Boettcher Np,  Ask patient do CA 19-9 and let her know this is part of the workup for her pancreas that we discussed a few days ago. Patient states understanding and will go to [facility] today to have this drawn. Orders placed in Epic for [facility].   [place], Krista CROME, NP  Mansouraty, Aloha Raddle., MD; Krista Odetta CROME, RN; Krista Carter, CMA Dr. Wilhelmenia, thanks so much for reviewing the chart and providing further recommendations, will get the CA 19-9 and keep you updated on results.  Krista Carter, can we have the patient do CA 19-9 and let her know this is part of the workup for her pancreas that we discussed a few days ago? Thanks!

## 2023-11-18 NOTE — Telephone Encounter (Signed)
 [facility] left voicemail stating that they have lab orders on patient but they are for [facility]. Montie from [facility] states she is unable to find IgG4 and the Pan 3 gene panel. Attempted to change lab orders but unable to fine Pancreatitits 3 gene panel for [facility]. Called [facility] back and left voicemail that I was unable to fine the gene 3 test

## 2023-11-18 NOTE — Telephone Encounter (Signed)
 Montie from [facility] returned call and states pt decided to go to [facility]

## 2023-11-19 ENCOUNTER — Ambulatory Visit (INDEPENDENT_AMBULATORY_CARE_PROVIDER_SITE_OTHER): Payer: Self-pay | Admitting: Gastroenterology

## 2023-11-19 LAB — CANCER ANTIGEN 19-9: CA 19-9: 10 U/mL (ref 0–35)

## 2023-11-25 ENCOUNTER — Ambulatory Visit (HOSPITAL_COMMUNITY)
Admission: RE | Admit: 2023-11-25 | Discharge: 2023-11-25 | Disposition: A | Discharge status: Home / Self Care | Source: Ambulatory Visit | Attending: Family Medicine | Admitting: Family Medicine

## 2023-11-25 DIAGNOSIS — E2839 Other primary ovarian failure: Secondary | ICD-10-CM | POA: Insufficient documentation

## 2023-11-25 DIAGNOSIS — Z780 Asymptomatic menopausal state: Secondary | ICD-10-CM | POA: Diagnosis not present

## 2023-11-25 DIAGNOSIS — M8589 Other specified disorders of bone density and structure, multiple sites: Secondary | ICD-10-CM | POA: Diagnosis not present

## 2023-11-25 NOTE — Telephone Encounter (Signed)
 The pt returned call and all information has been given to the pt

## 2023-11-25 NOTE — Telephone Encounter (Signed)
 Left message on machine to call back   Unable to reach pt by phone- all information sent to the pt My Chart and mailed to the pt home address

## 2023-11-29 ENCOUNTER — Other Ambulatory Visit: Payer: Self-pay | Admitting: Family Medicine

## 2023-12-03 ENCOUNTER — Ambulatory Visit (INDEPENDENT_AMBULATORY_CARE_PROVIDER_SITE_OTHER): Payer: Self-pay | Admitting: Gastroenterology

## 2023-12-05 LAB — IGG 4: IgG, Subclass 4: 24 mg/dL (ref 2–96)

## 2023-12-05 LAB — ANA: Anti Nuclear Antibody (ANA): NEGATIVE

## 2023-12-05 LAB — PANCREATITIS: 3-GENE PANEL

## 2023-12-06 ENCOUNTER — Telehealth (INDEPENDENT_AMBULATORY_CARE_PROVIDER_SITE_OTHER): Payer: Self-pay

## 2023-12-06 NOTE — Telephone Encounter (Signed)
 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  Which she says is an increased risk of Chronic pancreatitis . Recommendations are to have genetic counseling and family testing. Per Jenkins she sent you the results under cc Chart.

## 2023-12-08 ENCOUNTER — Other Ambulatory Visit: Payer: Self-pay | Admitting: Family Medicine

## 2023-12-08 DIAGNOSIS — F419 Anxiety disorder, unspecified: Secondary | ICD-10-CM

## 2023-12-10 ENCOUNTER — Other Ambulatory Visit: Payer: Self-pay | Admitting: Family Medicine

## 2023-12-10 DIAGNOSIS — I10 Essential (primary) hypertension: Secondary | ICD-10-CM

## 2023-12-10 NOTE — Telephone Encounter (Signed)
Noted. Thanks,

## 2023-12-10 NOTE — Progress Notes (Signed)
 Referral sent, they will contact patient with apt

## 2023-12-12 ENCOUNTER — Telehealth: Payer: Self-pay

## 2023-12-12 ENCOUNTER — Ambulatory Visit (HOSPITAL_COMMUNITY)
Admission: RE | Admit: 2023-12-12 | Discharge: 2023-12-12 | Disposition: A | Discharge status: Home / Self Care | Source: Ambulatory Visit | Attending: Family Medicine | Admitting: Family Medicine

## 2023-12-12 DIAGNOSIS — M25562 Pain in left knee: Secondary | ICD-10-CM | POA: Insufficient documentation

## 2023-12-12 DIAGNOSIS — M25569 Pain in unspecified knee: Secondary | ICD-10-CM | POA: Diagnosis not present

## 2023-12-12 DIAGNOSIS — M1710 Unilateral primary osteoarthritis, unspecified knee: Secondary | ICD-10-CM | POA: Diagnosis not present

## 2023-12-12 DIAGNOSIS — M25561 Pain in right knee: Secondary | ICD-10-CM | POA: Insufficient documentation

## 2023-12-12 DIAGNOSIS — M25469 Effusion, unspecified knee: Secondary | ICD-10-CM | POA: Diagnosis not present

## 2023-12-12 DIAGNOSIS — G8929 Other chronic pain: Secondary | ICD-10-CM | POA: Diagnosis not present

## 2023-12-12 DIAGNOSIS — M1712 Unilateral primary osteoarthritis, left knee: Secondary | ICD-10-CM | POA: Diagnosis not present

## 2023-12-12 DIAGNOSIS — M25462 Effusion, left knee: Secondary | ICD-10-CM | POA: Diagnosis not present

## 2023-12-12 NOTE — Telephone Encounter (Signed)
 Copied from CRM #[id number]. Topic: Referral - Request for Referral >> Dec 12, 2023 12:12 PM Nathanel BROCKS wrote: Did the patient discuss referral with their provider in the last year? Yes (If No - schedule appointment) (If Yes - send message)  Appointment offered? No  Type of order/referral and detailed reason for visit: Xray of knees   Preference of office, provider, location: [facility]  If referral order, have you been seen by this specialty before? Yes (If Yes, this issue or another issue? When? Where?  Can we respond through MyChart? Yes

## 2023-12-12 NOTE — Telephone Encounter (Signed)
 Patient advised to go to radiology dept to have xray done

## 2023-12-18 ENCOUNTER — Other Ambulatory Visit: Payer: Self-pay | Admitting: Family Medicine

## 2023-12-18 DIAGNOSIS — F5101 Primary insomnia: Secondary | ICD-10-CM

## 2023-12-20 ENCOUNTER — Other Ambulatory Visit: Payer: Self-pay | Admitting: Family Medicine

## 2023-12-20 DIAGNOSIS — M25561 Pain in right knee: Secondary | ICD-10-CM

## 2023-12-30 ENCOUNTER — Other Ambulatory Visit: Payer: Self-pay | Admitting: Family Medicine

## 2024-01-01 ENCOUNTER — Other Ambulatory Visit: Payer: Self-pay | Admitting: Family Medicine

## 2024-01-01 NOTE — Telephone Encounter (Signed)
 Copied from CRM [id number]. Topic: Clinical - Medication Refill >> Jan 01, 2024 10:54 AM Montie POUR wrote: Medication: clonazePAM  (KLONOPIN ) 0.5 MG tablet  Has the patient contacted their pharmacy? Yes (Agent: If no, request that the patient contact the pharmacy for the refill. If patient does not wish to contact the pharmacy document the reason why and proceed with request.) (Agent: If yes, when and what did the pharmacy advise?) Pharmacy needs an order to refill  This is the patient's preferred pharmacy:  [facility] [address] [place] [address] [place] [address] Phone: [phone] Fax: [phone]  Is this the correct pharmacy for this prescription? Yes If no, delete pharmacy and type the correct one.   Has the prescription been filled recently? No  Is the patient out of the medication? No  Has the patient been seen for an appointment in the last year OR does the patient have an upcoming appointment? Yes  Can we respond through MyChart? Yes  Agent: Please be advised that Rx refills may take up to 3 business days. We ask that you follow-up with your pharmacy.

## 2024-01-03 ENCOUNTER — Telehealth: Admitting: Genetic Counselor

## 2024-01-03 DIAGNOSIS — Z141 Cystic fibrosis carrier: Secondary | ICD-10-CM | POA: Diagnosis not present

## 2024-01-03 DIAGNOSIS — K859 Acute pancreatitis without necrosis or infection, unspecified: Secondary | ICD-10-CM | POA: Diagnosis not present

## 2024-01-03 NOTE — Progress Notes (Signed)
 MEDICAL GENETICS TELEMEDICINE VISIT   This is a Arts development officer E-Visit  provided via [employer]. Krista Carter consented to an E-Visit consult today.  Location of patient: at home in [place]  Location of provider: [facility]   The following participants were involved in this E-Visit:  Maddy Joshua (Genetic Counselor) Krista Carter   Total time on video call: 29 minutes  Patient name: Krista Carter DOB: August 31, 1955 Age: 68 y.o. MRN: [id number]  Referring Provider/Specialty: Mitzie Boettcher, NP Date of Evaluation: 01/03/2024 Chief Complaint/Reason for Referral: Cystic fibrosis carrier, chronic pancreatitis  Brief Summary: Krista Carter is a 68 y.o. female who presents today for an initial genetics evaluation for chronic pancreatitis and cystic fibrosis carrier status. She is unaccompanied at today's visit.   Prior genetic testing has been performed. 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   Family History: See pedigree obtained during today's visit under History->Family->Pedigree.  The family history was notable for the following: Son, 59 yo, hypertension and pancreatitis related to alcohol use. Son, 32 yo, with hypertension.  Sister, 41 yo, with alcohol abuse, an unknown eye condition requiring regular injections, hypertension, and hypercholesterolemia. Sister, 6 yo, no information available  Paternal Family History Father, deceased from cirrhosis related to alcohol use and lung cancer (history of smoking). Uncle, deceased in a motor vehicle accident. Grandfather, deceased from an unknown cancer in his 50s.  Maternal Family History Mother, deceased. 10 aunts and uncles, none with pancreatitis.  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 female with chronic pancreatitis and cystic fibrosis carrier status.  [name] had her first episode of pancreatitis in 2023 following persistent nausea and vomiting that led to ED admission.  Since this time, she has had several episodes of pancreatitis without a clearly identifiable cause.  [name] does have a son, 37 yo, who has also experienced pancreatitis; however this individual has also experienced alcohol abuse and his pancreatitis has been attributed to this.  There are no individuals with respiratory concerns.  In September, [name] had genetic testing for chronic pancreatitis through [facility] that included sequencing of the CFTR, PRSS1, and SPINK1.  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  We discussed that cystic fibrosis is typically inherited in an autosomal recessive manner, meaning that both copies of the CFTR gene must have pathogenic changes for an individual to develop symptoms of cystic fibrosis.  Because of this, we would not expect [name] to have CFTR-related respiratory concerns.  However, [name] would be considered a carrier for this condition.  Carriers of classic cystic fibrosis, like [name] are at an increased risk for pancreatitis.  Their risk is approximately 2.5 times the general population risk, around 7.5%.  This may explain why [name] has experienced pancreatitis without a clear cause.  There are no gene-specific treatment recommendations, [name] should continue to limit alcohol and highly fatty foods.  No changes to medical management are recommended based on this result.  We also discussed the  familial implications of this result. There is a  50% chance that each of [name]' sons also inherited this variant and would be considered carriers of cystic fibrosis as well. They and their children may consider this when making their own reproductive decisions.  Recommendations: Consider informing other family members of genetic testing results. Continue follow-up with other healthcare providers as recommended.  Date: 01/03/2024 Total time spent: 45 minutes Genetic Counselor-only Time: 45 minutes  [name] MS [facility] Certified Genetic Counselor [facility]  I have personally counseled the patient/family, spending > 50% of total time on counseling and coordination of care as outlined.

## 2024-01-04 ENCOUNTER — Other Ambulatory Visit: Payer: Self-pay | Admitting: Family Medicine

## 2024-01-04 MED ORDER — CLONAZEPAM 0.5 MG PO TABS: 0.5000 mg | ORAL_TABLET | Freq: Two times a day (BID) | ORAL | 0 refills | Status: DC | PRN

## 2024-01-08 ENCOUNTER — Telehealth: Payer: Self-pay | Admitting: Gastroenterology

## 2024-01-08 NOTE — Telephone Encounter (Addendum)
 Procedure:Upper EUS Procedure date: 01/16/24 Procedure location: [facility] Arrival Time: 11:00 am Spoke with the patient Y/N:Yes Any prep concerns? No Has the patient obtained the prep from the pharmacy ? No prep needed Do you have a care partner and transportation: Yes Any additional concerns? No

## 2024-01-09 ENCOUNTER — Encounter (INDEPENDENT_AMBULATORY_CARE_PROVIDER_SITE_OTHER): Payer: Self-pay | Admitting: Gastroenterology

## 2024-01-09 ENCOUNTER — Encounter (HOSPITAL_COMMUNITY): Payer: Self-pay | Admitting: Gastroenterology

## 2024-01-13 ENCOUNTER — Other Ambulatory Visit: Payer: Self-pay | Admitting: Family Medicine

## 2024-01-13 DIAGNOSIS — F5101 Primary insomnia: Secondary | ICD-10-CM

## 2024-01-14 ENCOUNTER — Other Ambulatory Visit: Payer: Self-pay | Admitting: Family Medicine

## 2024-01-14 DIAGNOSIS — F419 Anxiety disorder, unspecified: Secondary | ICD-10-CM

## 2024-01-15 NOTE — Anesthesia Preprocedure Evaluation (Signed)
 Anesthesia Evaluation  Patient identified by MRN, date of birth, ID band Patient awake    Reviewed: Allergy & Precautions, NPO status , Patient's Chart, lab work & pertinent test results, reviewed documented beta blocker date and time   Airway Mallampati: II  TM Distance: >3 FB Neck ROM: Full    Dental  (+) Missing, Poor Dentition, Edentulous Upper,    Pulmonary asthma , Current Smoker 30 pack year history   Pulmonary exam normal        Cardiovascular hypertension, Pt. on medications and Pt. on home beta blockers + Peripheral Vascular Disease  Normal cardiovascular exam+ dysrhythmias Atrial Fibrillation   Echo 2023  1. Left ventricular ejection fraction, by estimation, is 60 to 65%. The  left ventricle has normal function. The left ventricle has no regional  wall motion abnormalities. Left ventricular diastolic parameters were  normal.   2. Right ventricular systolic function is normal. The right ventricular  size is normal. Tricuspid regurgitation signal is inadequate for assessing  PA pressure.   3. The mitral valve is grossly normal. No evidence of mitral valve  regurgitation. No evidence of mitral stenosis.   4. The aortic valve is tricuspid. There is mild calcification of the  aortic valve. Aortic valve regurgitation is not visualized. Aortic valve  sclerosis is present, with no evidence of aortic valve stenosis.   5. The inferior vena cava is normal in size with greater than 50%  respiratory variability, suggesting right atrial pressure of 3 mmHg.     Neuro/Psych  Headaches PSYCHIATRIC DISORDERS Anxiety Depression       GI/Hepatic Neg liver ROS, PUD,GERD  Controlled and Medicated,,Chronic pancreatitis   Endo/Other  diabetes, Type 2    Renal/GU negative Renal ROS  negative genitourinary   Musculoskeletal  (+) Arthritis , Osteoarthritis,    Abdominal   Peds  Hematology negative hematology ROS (+)    Anesthesia Other Findings   Reproductive/Obstetrics negative OB ROS                              Anesthesia Physical Anesthesia Plan  ASA: 3  Anesthesia Plan: MAC   Post-op Pain Management:    Induction:   PONV Risk Score and Plan: 2 and Propofol  infusion and TIVA  Airway Management Planned: Natural Airway and Simple Face Mask  Additional Equipment: None  Intra-op Plan:   Post-operative Plan:   Informed Consent: I have reviewed the patients History and Physical, chart, labs and discussed the procedure including the risks, benefits and alternatives for the proposed anesthesia with the patient or authorized representative who has indicated his/her understanding and acceptance.       Plan Discussed with: CRNA  Anesthesia Plan Comments:          Anesthesia Quick Evaluation

## 2024-01-16 ENCOUNTER — Ambulatory Visit (HOSPITAL_COMMUNITY)
Admission: RE | Admit: 2024-01-16 | Discharge: 2024-01-16 | Disposition: A | Discharge status: Home / Self Care | Attending: Gastroenterology | Admitting: Gastroenterology

## 2024-01-16 ENCOUNTER — Ambulatory Visit (HOSPITAL_COMMUNITY): Admitting: Anesthesiology

## 2024-01-16 ENCOUNTER — Encounter (HOSPITAL_COMMUNITY): Payer: Self-pay | Admitting: Gastroenterology

## 2024-01-16 ENCOUNTER — Encounter (INDEPENDENT_AMBULATORY_CARE_PROVIDER_SITE_OTHER): Payer: Self-pay | Admitting: *Deleted

## 2024-01-16 ENCOUNTER — Encounter (HOSPITAL_COMMUNITY)
Admission: RE | Disposition: A | Discharge status: Home / Self Care | Payer: Self-pay | Source: Home / Self Care | Attending: Gastroenterology

## 2024-01-16 ENCOUNTER — Other Ambulatory Visit: Payer: Self-pay

## 2024-01-16 DIAGNOSIS — F418 Other specified anxiety disorders: Secondary | ICD-10-CM | POA: Diagnosis not present

## 2024-01-16 DIAGNOSIS — I10 Essential (primary) hypertension: Secondary | ICD-10-CM | POA: Diagnosis not present

## 2024-01-16 DIAGNOSIS — I899 Noninfective disorder of lymphatic vessels and lymph nodes, unspecified: Secondary | ICD-10-CM | POA: Diagnosis not present

## 2024-01-16 DIAGNOSIS — F419 Anxiety disorder, unspecified: Secondary | ICD-10-CM | POA: Diagnosis not present

## 2024-01-16 DIAGNOSIS — E1151 Type 2 diabetes mellitus with diabetic peripheral angiopathy without gangrene: Secondary | ICD-10-CM | POA: Diagnosis not present

## 2024-01-16 DIAGNOSIS — K2970 Gastritis, unspecified, without bleeding: Secondary | ICD-10-CM

## 2024-01-16 DIAGNOSIS — K861 Other chronic pancreatitis: Secondary | ICD-10-CM | POA: Diagnosis not present

## 2024-01-16 DIAGNOSIS — K2950 Unspecified chronic gastritis without bleeding: Secondary | ICD-10-CM

## 2024-01-16 DIAGNOSIS — F17210 Nicotine dependence, cigarettes, uncomplicated: Secondary | ICD-10-CM

## 2024-01-16 DIAGNOSIS — J45909 Unspecified asthma, uncomplicated: Secondary | ICD-10-CM | POA: Diagnosis not present

## 2024-01-16 DIAGNOSIS — I4891 Unspecified atrial fibrillation: Secondary | ICD-10-CM | POA: Insufficient documentation

## 2024-01-16 DIAGNOSIS — K3189 Other diseases of stomach and duodenum: Secondary | ICD-10-CM | POA: Diagnosis not present

## 2024-01-16 DIAGNOSIS — K8689 Other specified diseases of pancreas: Secondary | ICD-10-CM | POA: Diagnosis not present

## 2024-01-16 DIAGNOSIS — K449 Diaphragmatic hernia without obstruction or gangrene: Secondary | ICD-10-CM

## 2024-01-16 DIAGNOSIS — F32A Depression, unspecified: Secondary | ICD-10-CM | POA: Insufficient documentation

## 2024-01-16 DIAGNOSIS — I480 Paroxysmal atrial fibrillation: Secondary | ICD-10-CM | POA: Diagnosis not present

## 2024-01-16 DIAGNOSIS — K2289 Other specified disease of esophagus: Secondary | ICD-10-CM | POA: Insufficient documentation

## 2024-01-16 HISTORY — PX: EUS: SHX5427

## 2024-01-16 HISTORY — PX: ESOPHAGOGASTRODUODENOSCOPY: SHX5428

## 2024-01-16 SURGERY — ULTRASOUND, UPPER GI TRACT, ENDOSCOPIC
Anesthesia: Monitor Anesthesia Care

## 2024-01-16 MED ORDER — SODIUM CHLORIDE 0.9 % IV SOLN: INTRAVENOUS | Status: DC

## 2024-01-16 MED ORDER — PHENYLEPHRINE HCL (PRESSORS) 10 MG/ML IV SOLN
INTRAVENOUS | Status: DC | PRN
  Administered 2024-01-16: 160 ug via INTRAVENOUS

## 2024-01-16 MED ORDER — PROPOFOL 500 MG/50ML IV EMUL
INTRAVENOUS | Status: DC | PRN
Start: 2024-01-16 — End: 2024-01-16
  Administered 2024-01-16: 175 ug/kg/min via INTRAVENOUS

## 2024-01-16 MED ORDER — LACTATED RINGERS IV SOLN: INTRAVENOUS | Status: DC | PRN

## 2024-01-16 NOTE — Discharge Instructions (Signed)

## 2024-01-16 NOTE — Anesthesia Postprocedure Evaluation (Signed)
 Anesthesia Post Note  Patient: Krista Carter  Procedure(s) Performed: ULTRASOUND, UPPER GI TRACT, ENDOSCOPIC EGD (ESOPHAGOGASTRODUODENOSCOPY)     Patient location during evaluation: PACU Anesthesia Type: MAC Level of consciousness: awake and alert Pain management: pain level controlled Vital Signs Assessment: post-procedure vital signs reviewed and stable Respiratory status: spontaneous breathing, nonlabored ventilation and respiratory function stable Cardiovascular status: blood pressure returned to baseline Postop Assessment: no apparent nausea or vomiting Anesthetic complications: no   No notable events documented.  Last Vitals:  Vitals:   01/16/24 1255 01/16/24 1300  BP: (!) 120/57 (!) 113/55  Pulse: 66 67  Resp: 19 18  Temp:    SpO2: 100% 99%    Last Pain:  Vitals:   01/16/24 1300  TempSrc:   PainSc: 0-No pain                 Vertell Row

## 2024-01-16 NOTE — H&P (Signed)
 GASTROENTEROLOGY PROCEDURE H&P NOTE   Primary Care Physician: Terry Wilhelmena Lloyd Hilario, FNP  HPI: Krista Carter is a 68 y.o. female who presents for EGD/EUS for Chronic pancreatitis, EPI, dilated main duct and sidebranches.  Rule out ampullary lesion and PD stones.  Past Medical History:  Diagnosis Date   Acute alcoholic pancreatitis    Adrenal adenoma 02/2015   left, seen on CT.    Anemia 07/2010, 04/2015   normocytic.    Asthma, extrinsic, without status asthmaticus 07/09/2022   Atrial fibrillation (HCC)    Depression with anxiety 2008   Diabetes mellitus 2011   type 2.    Duodenitis 2005   HH also seen on EGD.    Dyslipidemia 2011   Expressive aphasia 03/2010   subjective word finding difficulty and slurred speach.  MRI/MRA, CT scan: mild small vessel disease, mild narrowing bilateral int carotids and RCA   Gastric ulcer    Hydronephrosis 2010   per ultrasound: mild, chronic, non-obstructing, bilateral.   Hypertension 2011   IBS (irritable bowel syndrome) 2008   diarrhea prone   Mitral valve regurgitation 03/2010   Mild MVR, EF 60-65% per echo.   Multiple gastric ulcers 09/2013   Benign, due to ASA powders.  H Pylori negative.     Pancreatitis    PVD (peripheral vascular disease)    Renal insufficiency 2014   AKI 10/2012 due to dehydration, acute (probably infectious/viral) colitis, ACE use.    Severe protein-calorie malnutrition 10/2012   Tibia/fibula fracture 04/2015   left.  Dital tibia, proximal fibula.    Past Surgical History:  Procedure Laterality Date   ABDOMINAL HYSTERECTOMY     BIOPSY  02/12/2023   Procedure: BIOPSY;  Surgeon: Cinderella Deatrice FALCON, MD;  [facility];  Service: Endoscopy;;   BONE MARROW BIOPSY  07/2010.    Benign fibroconnective and soft tissue with abundant acute and chronic inflammation, with foreign body multinucleated giant cell reaction. No atypia, no malignancy   colonoscopies  2008, 09/2013   both normal.    COLONOSCOPY  WITH PROPOFOL  N/A 02/12/2023   Procedure: COLONOSCOPY WITH PROPOFOL ;  Surgeon: Cinderella Deatrice FALCON, MD;  [facility];  Service: Endoscopy;  Laterality: N/A;  1:45PM;ASA 1-2   ESOPHAGOGASTRODUODENOSCOPY  2005, 09/2013, 10/2013   ESOPHAGOGASTRODUODENOSCOPY (EGD) WITH PROPOFOL  N/A 04/13/2015   Procedure: ESOPHAGOGASTRODUODENOSCOPY (EGD) WITH PROPOFOL ;  Surgeon: Gordy CHRISTELLA Starch, MD;  Location: [facility];  Service: Endoscopy;  Laterality: N/A;   ESOPHAGOGASTRODUODENOSCOPY (EGD) WITH PROPOFOL  N/A 02/12/2023   Procedure: ESOPHAGOGASTRODUODENOSCOPY (EGD) WITH PROPOFOL ;  Surgeon: Cinderella Deatrice FALCON, MD;  [facility];  Service: Endoscopy;  Laterality: N/A;  1:45AM;ASA 1-2   ORIF NONUNION HUMERUS FRACTURE  07/2010   with bone graft.    No current facility-administered medications for this encounter.   No current facility-administered medications for this encounter. No Known Allergies Family History  Problem Relation Age of Onset   Stroke Mother    Kidney cancer Mother    Heart failure Father    Hypertension Father    Heart disease Father    Diabetes Father    Diabetes Sister    Hypertension Sister    Colon cancer Maternal Uncle    Social History   Socioeconomic History   Marital status: Widowed    Spouse name: Not on file   Number of children: 2   Years of education: Not on file   Highest education level: Not on file  Occupational History   Occupation:  Retired  Tobacco Use   Smoking status: Every Day    Current packs/day: 1.50    Average packs/day: 1.5 packs/day for 20.0 years (30.0 ttl pk-yrs)    Types: Cigarettes    Passive exposure: Current   Smokeless tobacco: Never  Vaping Use   Vaping status: Never Used  Substance and Sexual Activity   Alcohol use: Yes    Comment: wine coolers occasional   Drug use: No   Sexual activity: Yes    Birth control/protection: None  Other Topics Concern   Not on file  Social History Narrative   Not on file   Social Drivers  of Health   Financial Resource Strain: Medium Risk (10/19/2021)   Received from [facility]   Overall Financial Resource Strain (CARDIA)    Difficulty of Paying Living Expenses: Somewhat hard  Food Insecurity: No Food Insecurity (10/19/2021)   Received from [facility]   Hunger Vital Sign    Within the past 12 months, you worried that your food would run out before you got the money to buy more.: Never true    Within the past 12 months, the food you bought just didn't last and you didn't have money to get more.: Never true  Transportation Needs: Unmet Transportation Needs (10/19/2021)   Received from [facility] - Transportation    Lack of Transportation (Medical): No    Lack of Transportation (Non-Medical): Yes  Physical Activity: Insufficiently Active (10/19/2021)   Received from [facility]   Exercise Vital Sign    On average, how many days per week do you engage in moderate to strenuous exercise (like a brisk walk)?: 1 day    On average, how many minutes do you engage in exercise at this level?: 30 min  Stress: Stress Concern Present (10/19/2021)   Received from [facility] - Occupational Stress Questionnaire    Feeling of Stress : Very much  Social Connections: Unknown (10/31/2022)   Received from [facility]   Social Network    Social Network: Not on file  Intimate Partner Violence: Unknown (10/31/2022)   Received from [facility]   HITS    Physically Hurt: Not on file    Insult or Talk Down To: Not on file    Threaten Physical Harm: Not on file    Scream or Curse: Not on file    Physical Exam: Today's Vitals   01/09/24 1258 01/16/24 1119  BP:  (!) 193/82  Pulse:  65  Resp:  12  Temp:  97.8 F (36.6 C)  TempSrc:  Temporal  SpO2:  95%  Weight: 64 kg 64.4 kg  Height:  5' 4 (1.626 m)  PainSc:  7    Body mass index is 24.37 kg/m. GEN: NAD EYE: Sclerae anicteric ENT: MMM CV: Non-tachycardic GI:  Soft, NT/ND NEURO:  Alert & Oriented x 3  Lab Results: No results for input(s): WBC, HGB, HCT, PLT in the last 72 hours. BMET No results for input(s): NA, K, CL, CO2, GLUCOSE, BUN, CREATININE, CALCIUM  in the last 72 hours. LFT No results for input(s): PROT, ALBUMIN, AST, ALT, ALKPHOS, BILITOT, BILIDIR, IBILI in the last 72 hours. PT/INR No results for input(s): LABPROT, INR in the last 72 hours.   Impression / Plan: This is a 68 y.o.female who presents for EGD/EUS for Chronic pancreatitis, EPI, dilated main duct and sidebranches.  Rule out ampullary lesion and PD stones.  The risks of an EUS  including intestinal perforation, bleeding, infection, aspiration, and medication effects were discussed as was the possibility it may not give a definitive diagnosis if a biopsy is performed.  When a biopsy of the pancreas is done as part of the EUS, there is an additional risk of pancreatitis at the rate of about 1-2%.  It was explained that procedure related pancreatitis is typically mild, although it can be severe and even life threatening, which is why we do not perform random pancreatic biopsies and only biopsy a lesion/area we feel is concerning enough to warrant the risk.   The risks and benefits of endoscopic evaluation/treatment were discussed with the patient and/or family; these include but are not limited to the risk of perforation, infection, bleeding, missed lesions, lack of diagnosis, severe illness requiring hospitalization, as well as anesthesia and sedation related illnesses.  The patient's history has been reviewed, patient examined, no change in status, and deemed stable for procedure.  The patient and/or family is agreeable to proceed.    Aloha Finner, MD [facility] Office # [phone]

## 2024-01-16 NOTE — Op Note (Signed)
 [facility] Patient Name: Krista Carter Procedure Date: 01/16/2024 MRN: [id number] Attending MD: Aloha Finner , MD, [id number] Date of Birth: 03/20/1956 CSN: [id number] Age: 68 Admit Type: Outpatient Procedure:                Upper EUS Indications:              Dilated pancreatic duct on MRCP, Exclusion of                            chronic pancreatitis, Generalized abdominal pain Providers:                Aloha Finner, MD, Almarie Masters, RN, Janie                            Billups, Technician, Felice Sar, Technician, Charleen Hasty, CRNA Referring MD:              Medicines:                Monitored Anesthesia Care Complications:            No immediate complications. Estimated Blood Loss:     Estimated blood loss was minimal. Procedure:                Pre-Anesthesia Assessment:                           - Prior to the procedure, a History and Physical                            was performed, and patient medications and                            allergies were reviewed. The patient's tolerance of                            previous anesthesia was also reviewed. The risks                            and benefits of the procedure and the sedation                            options and risks were discussed with the patient.                            All questions were answered, and informed consent                            was obtained. Prior Anticoagulants: The patient has                            taken no anticoagulant or antiplatelet agents. ASA  Grade Assessment: III - A patient with severe                            systemic disease. After reviewing the risks and                            benefits, the patient was deemed in satisfactory                            condition to undergo the procedure.                           After obtaining informed consent, the endoscope was                             passed under direct vision. Throughout the                            procedure, the patient's blood pressure, pulse, and                            oxygen saturations were monitored continuously. The                            [id number]-[id number] ([id number]) Olympus endoscope was introduced                            through the mouth, and advanced to the second part                            of duodenum. The [id number] ([id number]) Olympus                            endosonoscope was introduced through the mouth, and                            advanced to the duodenum for ultrasound examination                            from the stomach and duodenum. The upper EUS was                            accomplished without difficulty. The patient                            tolerated the procedure. Scope In: Scope Out: Findings:      ENDOSCOPIC FINDING: :      No gross lesions were noted in the entire esophagus.      The Z-line was irregular and was found 36 cm from the incisors.      A 2 cm hiatal hernia was present.      Patchy mildly erythematous mucosa without bleeding was found in the       entire examined stomach. Biopsies were taken with a cold forceps for  histology and Helicobacter pylori testing.      No gross lesions were noted in the duodenal bulb, in the first portion       of the duodenum and in the second portion of the duodenum.      The major papilla was normal.      ENDOSONOGRAPHIC FINDING: :      Endosonographic imaging of the pancreas showed sonographic changes       indicative of moderate-severe chronic pancreatitis in the entire       pancreas. The parenchyma had hyperechoic foci with shadowing, lobularity       without honeycombing and hyperechoic strands. The pancreatic duct had       multiple intraductal calculi, irregularity of contour, dilated       side-branches, duct dilation and a hyperechoic duct margin. The       pancreatic duct in the head measured 8.6 -> 6.0 mm  (this is directly       upstream from from 2 intraductal calculi in the head. The pancreatic       duct in the neck measured 4.9 -> 3.8 mm. The pancreatic duct in the body       measured 4.6 mm. The pancreatic duct in the tail measured 3.3 mm.      There was no sign of significant endosonographic abnormality in the       common bile duct (3.0 mm) and in the common hepatic duct (4.0 mm). Ducts       of normal caliber were identified.      Endosonographic imaging of the ampulla showed no intramural       (subepithelial) lesion.      Endosonographic imaging in the visualized portion of the liver showed no       mass.      No malignant-appearing lymph nodes were visualized in the celiac region       (level 20), peripancreatic region and porta hepatis region.      The celiac region was visualized. Impression:               EGD Impression:                           - No gross lesions in the entire esophagus. Z-line                            irregular, 36 cm from the incisors.                           - 2 cm hiatal hernia.                           - Erythematous mucosa in the stomach. Biopsied.                           - No gross lesions in the duodenal bulb, in the                            first portion of the duodenum and in the second                            portion of the duodenum.                           -  Normal major papilla.                           EUS Impression:                           - Endosonographic imaging of the pancreas showed                            sonographic changes consistent with moderate-severe                            chronic pancreatitis. Significant pancreatic ductal                            dilation within the head to neck region as result                            of 2 intraductal calculi in the head.                           - In the setting of significant chronic                            pancreatitis changes, the sensitivity of EUS for                             finding masses/lesions is decreased.                           - There was no sign of significant pathology in the                            common bile duct and in the common hepatic duct.                           - No evidence of an intramural ampullary lesion was                            found.                           - No malignant-appearing lymph nodes were                            visualized in the celiac region (level 20),                            peripancreatic region and porta hepatis region. Moderate Sedation:      Not Applicable - Patient had care per Anesthesia. Recommendation:           - The patient will be observed post-procedure,                            until all discharge criteria are met.                           -  Discharge patient to home.                           - Patient has a contact number available for                            emergencies. The signs and symptoms of potential                            delayed complications were discussed with the                            patient. Return to normal activities tomorrow.                            Written discharge instructions were provided to the                            patient.                           - Continue prior diet.                           - Observe patient's clinical course.                           - Await path results.                           - Will discuss with primary GI the                            role/consideration of Pancreatic ERCP in future                            depending on patient's symptoms.                           - The findings and recommendations were discussed                            with the patient.                           - The findings and recommendations were discussed                            with the designated responsible adult. Procedure Code(s):        --- Professional ---                           [phone],  Esophagogastroduodenoscopy, flexible,                            transoral; with endoscopic ultrasound examination  limited to the esophagus, stomach or duodenum, and                            adjacent structures                           43239, Esophagogastroduodenoscopy, flexible,                            transoral; with biopsy, single or multiple Diagnosis Code(s):        --- Professional ---                           K22.89, Other specified disease of esophagus                           K44.9, Diaphragmatic hernia without obstruction or                            gangrene                           K31.89, Other diseases of stomach and duodenum                           I89.9, Noninfective disorder of lymphatic vessels                            and lymph nodes, unspecified                           K86.89, Other specified diseases of pancreas                           R10.84, Generalized abdominal pain                           R93.3, Abnormal findings on diagnostic imaging of                            other parts of digestive tract CPT copyright 2022 [employer]. All rights reserved. The codes documented in this report are preliminary and upon coder review may  be revised to meet current compliance requirements. Aloha Finner, MD 01/16/2024 12:53:59 PM Number of Addenda: 0

## 2024-01-16 NOTE — Transfer of Care (Signed)
 Immediate Anesthesia Transfer of Care Note  Patient: Krista Carter  Procedure(s) Performed: ULTRASOUND, UPPER GI TRACT, ENDOSCOPIC EGD (ESOPHAGOGASTRODUODENOSCOPY)  Patient Location: PACU  Anesthesia Type:MAC  Level of Consciousness: sedated, patient cooperative, and responds to stimulation  Airway & Oxygen Therapy: Patient Spontanous Breathing and Patient connected to face mask oxygen  Post-op Assessment: Report given to RN and Post -op Vital signs reviewed and stable  Post vital signs: Reviewed and stable  Last Vitals:  Vitals Value Taken Time  BP 91/47 01/16/24 12:53  Temp 36.7 C 01/16/24 12:49  Pulse 55 01/16/24 12:54  Resp 15 01/16/24 12:54  SpO2 100 % 01/16/24 12:54  Vitals shown include unfiled device data.  Last Pain:  Vitals:   01/16/24 1249  TempSrc: Temporal  PainSc:          Complications: No notable events documented.

## 2024-01-17 ENCOUNTER — Ambulatory Visit: Payer: Self-pay | Admitting: Gastroenterology

## 2024-01-17 LAB — SURGICAL PATHOLOGY

## 2024-01-19 ENCOUNTER — Encounter (HOSPITAL_COMMUNITY): Payer: Self-pay | Admitting: Gastroenterology

## 2024-01-21 ENCOUNTER — Other Ambulatory Visit (INDEPENDENT_AMBULATORY_CARE_PROVIDER_SITE_OTHER): Payer: Self-pay | Admitting: Gastroenterology

## 2024-02-05 ENCOUNTER — Other Ambulatory Visit: Payer: Self-pay | Admitting: Family Medicine

## 2024-02-07 NOTE — Telephone Encounter (Signed)
Pt called to report that she is completely out of her current supply, please advise

## 2024-02-10 ENCOUNTER — Ambulatory Visit

## 2024-02-13 ENCOUNTER — Other Ambulatory Visit: Payer: Self-pay | Admitting: Family Medicine

## 2024-02-13 DIAGNOSIS — F419 Anxiety disorder, unspecified: Secondary | ICD-10-CM

## 2024-02-18 ENCOUNTER — Other Ambulatory Visit: Payer: Self-pay | Admitting: Family Medicine

## 2024-02-18 DIAGNOSIS — E7800 Pure hypercholesterolemia, unspecified: Secondary | ICD-10-CM

## 2024-02-25 ENCOUNTER — Other Ambulatory Visit: Payer: Self-pay | Admitting: Family Medicine

## 2024-02-25 DIAGNOSIS — F5101 Primary insomnia: Secondary | ICD-10-CM

## 2024-03-05 ENCOUNTER — Other Ambulatory Visit: Payer: Self-pay | Admitting: Family Medicine

## 2024-03-05 DIAGNOSIS — I10 Essential (primary) hypertension: Secondary | ICD-10-CM

## 2024-03-12 ENCOUNTER — Telehealth: Payer: Self-pay | Admitting: *Deleted

## 2024-03-12 NOTE — Telephone Encounter (Signed)
 To do schedule colonoscopy. LM with call back #.

## 2024-03-13 ENCOUNTER — Telehealth: Admitting: Family Medicine

## 2024-03-13 DIAGNOSIS — R7301 Impaired fasting glucose: Secondary | ICD-10-CM

## 2024-03-13 DIAGNOSIS — E038 Other specified hypothyroidism: Secondary | ICD-10-CM | POA: Diagnosis not present

## 2024-03-13 DIAGNOSIS — E559 Vitamin D deficiency, unspecified: Secondary | ICD-10-CM

## 2024-03-13 DIAGNOSIS — E538 Deficiency of other specified B group vitamins: Secondary | ICD-10-CM

## 2024-03-13 DIAGNOSIS — E782 Mixed hyperlipidemia: Secondary | ICD-10-CM

## 2024-03-13 NOTE — Progress Notes (Signed)
 Virtual Visit via Video Note  I connected with QUINNE PIRES on 03/13/2024 at  9:00 AM EST by a video enabled telemedicine application and verified that I am speaking with the correct person using two identifiers.  Patient Location: Home Provider Location: Home Office  I discussed the limitations, risks, security, and privacy concerns of performing an evaluation and management service by video and the availability of in person appointments. I also discussed with the patient that there may be a patient responsible charge related to this service. The patient expressed understanding and agreed to proceed.  Subjective: PCP: Terry Wilhelmena Lloyd Hilario, FNP  Chief Complaint  Patient presents with   Medical Management of Chronic Issues    Follow up   Fatigue    Increased fatigue    HPI The patient is in today with complaints of increased fatigue over the past few weeks. She reports no fever, decreased appetite, or unintentional weight loss.   Fatigue -Vitamin B complex (containing fursultiamine 50 mg, hydroxocobalamin 250 micrograms, pyridoxal phosphate 30 mg, and riboflavin 5 mg) - B12 1000mcg daily  Here are evidence-based nonpharmacological interventions for fatigue, especially relevant for patients with chronic conditions, cancer-related fatigue, or general fatigue:  1. Physical Activity Regular, moderate exercise (e.g., walking, stretching, yoga) Shown to reduce fatigue and improve energy levels over time Tailored exercise programs (e.g., physical therapy) may be beneficial for deconditioned patients   2. Sleep Hygiene Maintain a consistent sleep schedule Avoid caffeine or electronics before bedtime Create a quiet, dark, and comfortable sleeping environment  3. Energy Conservation Techniques Prioritize tasks and pace activities throughout the day Alternate periods of activity with rest Use assistive devices when needed (e.g., walkers, shower chairs)  4. Nutritional  Support Eat small, frequent, balanced meals throughout the day Stay hydrated Address any underlying nutrient deficiencies (e.g., iron, B12)  5. Stress Management Use relaxation techniques: deep breathing, meditation, or guided imagery Consider counseling or cognitive behavioral therapy (CBT) if emotional distress is contributing to fatigue    ROS: Per HPI Current Medications[1]  Observations/Objective: There were no vitals filed for this visit. Physical Exam Patient is well-developed, well-nourished in no acute distress.  Resting comfortably at home.  Head is normocephalic, atraumatic.  No labored breathing.  Speech is clear and coherent with logical content.  Patient is alert and oriented at baseline.   Assessment and Plan: Vitamin B12 deficiency -     Vitamin B12  IFG (impaired fasting glucose) -     Hemoglobin A1c  Vitamin D  deficiency -     VITAMIN D  25 Hydroxy (Vit-D Deficiency, Fractures)  TSH (thyroid -stimulating hormone deficiency) -     TSH + free T4  Mixed hyperlipidemia -     Lipid panel -     CMP14+EGFR -     CBC with Differential/Platelet   Fatigue -Vitamin B complex (containing fursultiamine 50 mg, hydroxocobalamin 250 micrograms, pyridoxal phosphate 30 mg, and riboflavin 5 mg) - B12 1000mcg daily  Reviewed  evidence-based nonpharmacological interventions for fatigue:  1. Physical Activity Regular, moderate exercise (e.g., walking, stretching, yoga) Shown to reduce fatigue and improve energy levels over time Tailored exercise programs (e.g., physical therapy) may be beneficial for deconditioned patients   2. Sleep Hygiene Maintain a consistent sleep schedule Avoid caffeine or electronics before bedtime Create a quiet, dark, and comfortable sleeping environment  3. Energy Conservation Techniques Prioritize tasks and pace activities throughout the day Alternate periods of activity with rest Use assistive devices when needed (e.g., walkers,  shower chairs)  4. Nutritional Support Eat small, frequent, balanced meals throughout the day Stay hydrated Address any underlying nutrient deficiencies (e.g., iron, B12)  5. Stress Management Use relaxation techniques: deep breathing, meditation, or guided imagery Consider counseling or cognitive behavioral therapy (CBT) if emotional distress is contributing to fatigue   Follow Up Instructions: No follow-ups on file.   I discussed the assessment and treatment plan with the patient. The patient was provided an opportunity to ask questions, and all were answered. The patient agreed with the plan and demonstrated an understanding of the instructions.   The patient was advised to call back or seek an in-person evaluation if the symptoms worsen or if the condition fails to improve as anticipated.  The above assessment and management plan was discussed with the patient. The patient verbalized understanding of and has agreed to the management plan.   Meade JENEANE Gerlach, FNP     [1]  Current Outpatient Medications:    aspirin  EC 81 MG tablet, Take 81 mg by mouth daily.  , Disp: , Rfl:    Bismuth  262 MG CHEW, Chew 524 mg by mouth 3 (three) times daily., Disp: 180 tablet, Rfl: 1   clonazePAM  (KLONOPIN ) 0.5 MG tablet, Take 1 tablet by mouth twice daily as needed, Disp: 60 tablet, Rfl: 0   cyclobenzaprine  (FLEXERIL ) 10 MG tablet, Take 1 tablet (10 mg total) by mouth 2 (two) times daily as needed for muscle spasms., Disp: 60 tablet, Rfl: 2   dicyclomine  (BENTYL ) 10 MG capsule, Take 10 mg by mouth 4 (four) times daily., Disp: , Rfl:    gabapentin (NEURONTIN) 100 MG capsule, Take 100 mg by mouth 3 (three) times daily., Disp: , Rfl:    hydrOXYzine  (VISTARIL ) 25 MG capsule, TAKE 1 CAPSULE BY MOUTH TWICE DAILY AS NEEDED, Disp: 60 capsule, Rfl: 0   lipase/protease/amylase (CREON ) 36000 UNITS CPEP capsule, Take 2 capsules (72,000 Units total) by mouth 3 (three) times daily with meals. May also take 1  capsule (36,000 Units total) as needed (with snacks - up to 4 snacks daily)., Disp: 300 capsule, Rfl: 11   lisinopril  (ZESTRIL ) 10 MG tablet, Take 1 tablet (10 mg total) by mouth daily., Disp: 90 tablet, Rfl: 1   meclizine  (ANTIVERT ) 12.5 MG tablet, Take 1 tablet (12.5 mg total) by mouth 2 (two) times daily as needed for dizziness., Disp: 30 tablet, Rfl: 0   metoprolol  succinate (TOPROL -XL) 100 MG 24 hr tablet, Take 1 tablet by mouth once daily, Disp: 90 tablet, Rfl: 0   omeprazole  (PRILOSEC) 40 MG capsule, Take 1 capsule (40 mg total) by mouth daily., Disp: 90 capsule, Rfl: 1   rosuvastatin  (CRESTOR ) 20 MG tablet, TAKE 1 TABLET BY MOUTH AT BEDTIME, Disp: 90 tablet, Rfl: 0   traZODone  (DESYREL ) 50 MG tablet, TAKE 1 TABLET BY MOUTH AT BEDTIME AS NEEDED FOR SLEEP, Disp: 30 tablet, Rfl: 0

## 2024-03-14 ENCOUNTER — Other Ambulatory Visit: Payer: Self-pay | Admitting: Family Medicine

## 2024-03-14 DIAGNOSIS — I10 Essential (primary) hypertension: Secondary | ICD-10-CM

## 2024-03-17 ENCOUNTER — Other Ambulatory Visit: Payer: Self-pay | Admitting: Family Medicine

## 2024-03-17 NOTE — Progress Notes (Signed)
 Krista Carter                                          MRN: [id number]   03/17/2024   The [facility] Quality Team Specialist reviewed this patient medical record for the purposes of chart review for care gap closure. The following were reviewed: chart review for care gap closure-kidney health evaluation for diabetes:eGFR  and uACR.    [facility] Quality Team

## 2024-03-20 ENCOUNTER — Telehealth: Payer: Self-pay

## 2024-03-20 NOTE — Telephone Encounter (Signed)
 Copied from CRM #[id number]. Topic: Clinical - Medication Question >> Mar 20, 2024  2:58 PM Fonda T wrote: Reason for CRM: Pt calling to check status of medication that she needs refilled, states pharmacy has reached out multiple times to inquire on refill but no response from provider.  Medication: clonazePAM  (KLONOPIN ) 0.5 MG tablet  Per chart review, refill is in pending status.  Pt requesting a follow up call with status on refill.  Can be reached at [phone]   Pt aware of same day call back

## 2024-03-23 ENCOUNTER — Telehealth: Payer: Self-pay

## 2024-03-23 ENCOUNTER — Other Ambulatory Visit: Payer: Self-pay | Admitting: Family Medicine

## 2024-03-23 NOTE — Telephone Encounter (Signed)
 Copied from CRM #[id number]. Topic: Clinical - Prescription Issue >> Mar 23, 2024  1:48 PM Joesph B wrote: Reason for CRM: Patient has been waiting on a refill for her medication, clonazePAM  (KLONOPIN ) 0.5 MG tablet [[id number]]. She would like to know why it has not been called in yet. Please FU with patient.

## 2024-04-03 ENCOUNTER — Other Ambulatory Visit: Payer: Self-pay | Admitting: Family Medicine

## 2024-04-03 DIAGNOSIS — F5101 Primary insomnia: Secondary | ICD-10-CM

## 2024-04-07 ENCOUNTER — Ambulatory Visit

## 2024-04-07 VITALS — Ht 64.0 in | Wt 145.0 lb

## 2024-04-07 DIAGNOSIS — Z0001 Encounter for general adult medical examination with abnormal findings: Secondary | ICD-10-CM | POA: Diagnosis not present

## 2024-04-07 DIAGNOSIS — F17210 Nicotine dependence, cigarettes, uncomplicated: Secondary | ICD-10-CM | POA: Diagnosis not present

## 2024-04-07 DIAGNOSIS — Z0000 Encounter for general adult medical examination without abnormal findings: Secondary | ICD-10-CM

## 2024-04-07 DIAGNOSIS — Z1231 Encounter for screening mammogram for malignant neoplasm of breast: Secondary | ICD-10-CM

## 2024-04-07 NOTE — Patient Instructions (Signed)
 Ms. Jungwirth,  Thank you for taking the time for your Medicare Wellness Visit. I appreciate your continued commitment to your health goals. Please review the care plan we discussed, and feel free to reach out if I can assist you further.  Please note that Annual Wellness Visits do not include a physical exam. Some assessments may be limited, especially if the visit was conducted virtually. If needed, we may recommend an in-person follow-up with your provider.  Ongoing Care Seeing your primary care provider every 3 to 6 months helps us  monitor your health and provide consistent, personalized care.   1 year follow up for Medicare well visit: April 09, 2025 at 8:40 am with medicare wellness nurse in office  Referrals If a referral was made during today's visit and you haven't received any updates within two weeks, please contact the referred provider directly to check on the status.  Mammogram at [facility] Call [phone] to schedule your screening No perfumes, lotions, or deodorants the day of your screening. You can schedule your mammogram through mychart!   Lung Cancer Screening-[place] Office [address]-First Floor Medical Building directly across from [facility] Phone Number:[phone]   Recommended Screenings:  Health Maintenance  Topic Date Due   Medicare Annual Wellness Visit  Never done   Pneumococcal Vaccine for age over 90 (1 of 2 - PCV) Never done   Screening for Lung Cancer  Never done   Zoster (Shingles) Vaccine (1 of 2) Never done   Breast Cancer Screening  08/10/2023   COVID-19 Vaccine (1 - 2025-26 season) Never done   Flu Shot  06/30/2024*   Osteoporosis screening with Bone Density Scan  11/24/2025   DTaP/Tdap/Td vaccine (2 - Td or Tdap) 06/19/2032   Colon Cancer Screening  02/11/2033   Hepatitis C Screening  Completed   Meningitis B Vaccine  Aged Out  *Topic was postponed. The date shown is not the original due date.       04/07/2024    9:33 AM   Advanced Directives  Does Patient Have a Medical Advance Directive? No  Would patient like information on creating a medical advance directive? Yes (MAU/Ambulatory/Procedural Areas - Information given)    Vision: Annual vision screenings are recommended for early detection of glaucoma, cataracts, and diabetic retinopathy. These exams can also reveal signs of chronic conditions such as diabetes and high blood pressure.  Dental: Annual dental screenings help detect early signs of oral cancer, gum disease, and other conditions linked to overall health, including heart disease and diabetes.  Please see the attached documents for additional preventive care recommendations.

## 2024-04-07 NOTE — Progress Notes (Signed)
 "  No voiced or noted concerns at this time. HM Addressed: Mammogram ordered Referral sent for Low Dose Chest CT (smoker/hx smoking) Chief Complaint  Patient presents with   Medicare Wellness     Subjective:   Krista Carter is a 69 y.o. female who presents for a Medicare Annual Wellness Visit.  Visit info / Clinical Intake: Medicare Wellness Visit Type:: Initial Annual Wellness Visit Persons participating in visit and providing information:: patient Medicare Wellness Visit Mode:: Telephone If telephone:: video error Since this visit was completed virtually, some vitals may be partially provided or unavailable. Missing vitals are due to the limitations of the virtual format.: Documented vitals are patient reported If Telephone or Video please confirm:: I connected with patient using audio/video enable telemedicine. I verified patient identity with two identifiers, discussed telehealth limitations, and patient agreed to proceed. Patient Location:: home Provider Location:: office Interpreter Needed?: No Pre-visit prep was completed: yes AWV questionnaire completed by patient prior to visit?: no Patient's Overall Health Status Rating: (!) fair Typical amount of pain: some Does pain affect daily life?: no Are you currently prescribed opioids?: no  Dietary Habits and Nutritional Risks How many meals a day?: (!) 1 Eats fruit and vegetables daily?: yes Most meals are obtained by: preparing own meals In the last 2 weeks, have you had any of the following?: none Diabetic:: no  Functional Status Activities of Daily Living (to include ambulation/medication): Independent Ambulation: Independent Medication Administration: Independent Home Management (perform basic housework or laundry): Independent Manage your own finances?: yes Primary transportation is: driving Concerns about vision?: no *vision screening is required for WTM* Concerns about hearing?: no  Fall Screening Falls in the  past year?: 0 Number of falls in past year: 0 Was there an injury with Fall?: 0 Fall Risk Category Calculator: 0 Patient Fall Risk Level: Low Fall Risk  Fall Risk Patient at Risk for Falls Due to: No Fall Risks Fall risk Follow up: Falls evaluation completed; Education provided; Falls prevention discussed  Home and Transportation Safety: All rugs have non-skid backing?: yes All stairs or steps have railings?: (!) no Grab bars in the bathtub or shower?: (!) no Have non-skid surface in bathtub or shower?: yes Good home lighting?: yes Regular seat belt use?: yes Hospital stays in the last year:: no  Cognitive Assessment Difficulty concentrating, remembering, or making decisions? : no Will 6CIT or Mini Cog be Completed: yes What year is it?: 0 points What month is it?: 0 points Give patient an address phrase to remember (5 components): [address] [place] About what time is it?: 0 points Count backwards from 20 to 1: 0 points Say the months of the year in reverse: 0 points Repeat the address phrase from earlier: 0 points 6 CIT Score: 0 points  Advance Directives (For Healthcare) Does Patient Have a Medical Advance Directive?: No Would patient like information on creating a medical advance directive?: Yes (MAU/Ambulatory/Procedural Areas - Information given)  Reviewed/Updated  Reviewed/Updated: Reviewed All (Medical, Surgical, Family, Medications, Allergies, Care Teams, Patient Goals)    Allergies (verified) Patient has no known allergies.   Current Medications (verified) Outpatient Encounter Medications as of 04/07/2024  Medication Sig   aspirin  EC 81 MG tablet Take 81 mg by mouth daily.     Bismuth  262 MG CHEW Chew 524 mg by mouth 3 (three) times daily.   clonazePAM  (KLONOPIN ) 0.5 MG tablet Take 1 tablet by mouth twice daily as needed   cyclobenzaprine  (FLEXERIL ) 10 MG tablet Take  1 tablet (10 mg total) by mouth 2 (two) times daily as needed for muscle spasms.    dicyclomine  (BENTYL ) 10 MG capsule Take 10 mg by mouth 4 (four) times daily.   gabapentin (NEURONTIN) 100 MG capsule Take 100 mg by mouth 3 (three) times daily.   hydrOXYzine  (VISTARIL ) 25 MG capsule TAKE 1 CAPSULE BY MOUTH TWICE DAILY AS NEEDED   lipase/protease/amylase (CREON ) 36000 UNITS CPEP capsule Take 2 capsules (72,000 Units total) by mouth 3 (three) times daily with meals. May also take 1 capsule (36,000 Units total) as needed (with snacks - up to 4 snacks daily).   lisinopril  (ZESTRIL ) 10 MG tablet Take 1 tablet by mouth once daily   meclizine  (ANTIVERT ) 12.5 MG tablet Take 1 tablet (12.5 mg total) by mouth 2 (two) times daily as needed for dizziness.   metoprolol  succinate (TOPROL -XL) 100 MG 24 hr tablet Take 1 tablet by mouth once daily   omeprazole  (PRILOSEC) 40 MG capsule Take 1 capsule (40 mg total) by mouth daily.   rosuvastatin  (CRESTOR ) 20 MG tablet TAKE 1 TABLET BY MOUTH AT BEDTIME   traZODone  (DESYREL ) 50 MG tablet TAKE 1 TABLET BY MOUTH AT BEDTIME AS NEEDED FOR SLEEP   No facility-administered encounter medications on file as of 04/07/2024.    History: Past Medical History:  Diagnosis Date   Acute alcoholic pancreatitis    Adrenal adenoma 02/2015   left, seen on CT.    Anemia 07/2010, 04/2015   normocytic.    Asthma, extrinsic, without status asthmaticus 07/09/2022   Atrial fibrillation (HCC)    Depression with anxiety 2008   Diabetes mellitus 2011   type 2.    Duodenitis 2005   HH also seen on EGD.    Dyslipidemia 2011   Expressive aphasia 03/2010   subjective word finding difficulty and slurred speach.  MRI/MRA, CT scan: mild small vessel disease, mild narrowing bilateral int carotids and RCA   Gastric ulcer    Hydronephrosis 2010   per ultrasound: mild, chronic, non-obstructing, bilateral.   Hypertension 2011   IBS (irritable bowel syndrome) 2008   diarrhea prone   Mitral valve regurgitation 03/2010   Mild MVR, EF 60-65% per echo.   Multiple gastric ulcers  09/2013   Benign, due to ASA powders.  H Pylori negative.     Pancreatitis    PVD (peripheral vascular disease)    Renal insufficiency 2014   AKI 10/2012 due to dehydration, acute (probably infectious/viral) colitis, ACE use.    Severe protein-calorie malnutrition 10/2012   Tibia/fibula fracture 04/2015   left.  Dital tibia, proximal fibula.    Past Surgical History:  Procedure Laterality Date   ABDOMINAL HYSTERECTOMY     BIOPSY  02/12/2023   Procedure: BIOPSY;  Surgeon: Cinderella Deatrice FALCON, MD;  Location: [facility];  Service: Endoscopy;;   BONE MARROW BIOPSY  07/2010.    Benign fibroconnective and soft tissue with abundant acute and chronic inflammation, with foreign body multinucleated giant cell reaction. No atypia, no malignancy   colonoscopies  2008, 09/2013   both normal.    COLONOSCOPY WITH PROPOFOL  N/A 02/12/2023   Procedure: COLONOSCOPY WITH PROPOFOL ;  Surgeon: Cinderella Deatrice FALCON, MD;  Location: [facility];  Service: Endoscopy;  Laterality: N/A;  1:45PM;ASA 1-2   ESOPHAGOGASTRODUODENOSCOPY  2005, 09/2013, 10/2013   ESOPHAGOGASTRODUODENOSCOPY N/A 01/16/2024   Procedure: EGD (ESOPHAGOGASTRODUODENOSCOPY);  Surgeon: Wilhelmenia Aloha Raddle., MD;  Location: [facility];  Service: Gastroenterology;  Laterality: N/A;   ESOPHAGOGASTRODUODENOSCOPY (EGD) WITH PROPOFOL  N/A  04/13/2015   Procedure: ESOPHAGOGASTRODUODENOSCOPY (EGD) WITH PROPOFOL ;  Surgeon: Gordy CHRISTELLA Starch, MD;  Location: [facility];  Service: Endoscopy;  Laterality: N/A;   ESOPHAGOGASTRODUODENOSCOPY (EGD) WITH PROPOFOL  N/A 02/12/2023   Procedure: ESOPHAGOGASTRODUODENOSCOPY (EGD) WITH PROPOFOL ;  Surgeon: Cinderella Deatrice FALCON, MD;  Location: [facility];  Service: Endoscopy;  Laterality: N/A;  1:45AM;ASA 1-2   EUS N/A 01/16/2024   Procedure: ULTRASOUND, UPPER GI TRACT, ENDOSCOPIC;  Surgeon: Wilhelmenia Aloha Raddle., MD;  Location: [facility];  Service: Gastroenterology;  Laterality: N/A;   ORIF NONUNION HUMERUS FRACTURE  07/2010    with bone graft.    Family History  Problem Relation Age of Onset   Stroke Mother    Kidney cancer Mother    Heart failure Father    Hypertension Father    Heart disease Father    Diabetes Father    Lung cancer Father        smoker   Cirrhosis Father    Diabetes Sister    Hypertension Sister    Hypertension Sister    High Cholesterol Sister    Colon cancer Maternal Uncle    Cancer Paternal Grandfather    Hypertension Son    Hypertension Son    Pancreatitis Son        (alcohol)   Crohn's disease Granddaughter    Social History   Occupational History   Occupation: Retired  Tobacco Use   Smoking status: Every Day    Current packs/day: 1.50    Average packs/day: 1.5 packs/day for 41.0 years (61.5 ttl pk-yrs)    Types: Cigarettes    Start date: 1985    Passive exposure: Current   Smokeless tobacco: Never  Vaping Use   Vaping status: Never Used  Substance and Sexual Activity   Alcohol use: Yes    Comment: wine coolers occasional   Drug use: No   Sexual activity: Yes    Birth control/protection: None   Tobacco Counseling Ready to quit: No Counseling given: Yes  SDOH Screenings   Food Insecurity: No Food Insecurity (04/07/2024)  Housing: Low Risk (04/07/2024)  Transportation Needs: Unmet Transportation Needs (04/07/2024)  Utilities: Not At Risk (04/07/2024)  Depression (PHQ2-9): Low Risk (04/07/2024)  Financial Resource Strain: Medium Risk (10/19/2021)   Received from [facility]  Physical Activity: Inactive (04/07/2024)  Social Connections: Socially Isolated (04/07/2024)  Stress: No Stress Concern Present (04/07/2024)  Tobacco Use: High Risk (04/07/2024)  Health Literacy: Adequate Health Literacy (04/07/2024)   See flowsheets for full screening details  Depression Screen PHQ 2 & 9 Depression Scale- Over the past 2 weeks, how often have you been bothered by any of the following problems? Little interest or pleasure in doing things: 0 Feeling down, depressed, or hopeless  (PHQ Adolescent also includes...irritable): 0 PHQ-2 Total Score: 0 Trouble falling or staying asleep, or sleeping too much: 1 Feeling tired or having little energy: 0 Poor appetite or overeating (PHQ Adolescent also includes...weight loss): 3 Feeling bad about yourself - or that you are a failure or have let yourself or your family down: 0 Trouble concentrating on things, such as reading the newspaper or watching television (PHQ Adolescent also includes...like school work): 0 Moving or speaking so slowly that other people could have noticed. Or the opposite - being so fidgety or restless that you have been moving around a lot more than usual: 0 Thoughts that you would be better off dead, or of hurting yourself in some way: 0 PHQ-9 Total Score: 4 If you checked off any  problems, how difficult have these problems made it for you to do your work, take care of things at home, or get along with other people?: Not difficult at all  Depression Treatment Depression Interventions/Treatment : Patient refuses Treatment     Goals Addressed               This Visit's Progress     I just want to have a better year this year all the way around (pt-stated)               Objective:    Today's Vitals   04/07/24 0931  Weight: 145 lb (65.8 kg)  Height: 5' 4 (1.626 m)   Body mass index is 24.89 kg/m.  Hearing/Vision screen Hearing Screening - Comments:: Patient denies any hearing difficulties.   Vision Screening - Comments:: Patient does not have an eye doctor. A list of eye doctors has been provided to the patient.   Immunizations and Health Maintenance Health Maintenance  Topic Date Due   Medicare Annual Wellness (AWV)  Never done   Pneumococcal Vaccine: 50+ Years (1 of 2 - PCV) Never done   Lung Cancer Screening  Never done   Zoster Vaccines- Shingrix (1 of 2) Never done   Mammogram  08/10/2023   COVID-19 Vaccine (1 - 2025-26 season) Never done   Influenza Vaccine  06/30/2024  (Originally 11/01/2023)   Bone Density Scan  11/24/2025   DTaP/Tdap/Td (2 - Td or Tdap) 06/19/2032   Colonoscopy  02/11/2033   Hepatitis C Screening  Completed   Meningococcal B Vaccine  Aged Out        Assessment/Plan:  This is a routine wellness examination for Krista Carter.  Patient Care Team: Del Wilhelmena Falter, Hilario, FNP as PCP - General (Family Medicine) Mallipeddi, Diannah SQUIBB, MD as Consulting Physician (Cardiology) Johnson Laymon CHRISTELLA RIGGERS as Physician Assistant (Cardiology) Mansouraty, Aloha Raddle., MD as Consulting Physician (Gastroenterology)  I have personally reviewed and noted the following in the patients chart:   Medical and social history Use of alcohol, tobacco or illicit drugs  Current medications and supplements including opioid prescriptions. Functional ability and status Nutritional status Physical activity Advanced directives List of other physicians Hospitalizations, surgeries, and ER visits in previous 12 months Vitals Screenings to include cognitive, depression, and falls Referrals and appointments  Orders Placed This Encounter  Procedures   MM 3D SCREENING MAMMOGRAM BILATERAL BREAST    Standing Status:   Future    Expiration Date:   04/07/2025    Reason for Exam (SYMPTOM  OR DIAGNOSIS REQUIRED):   breast cancer screening    Preferred imaging location?:   [facility]   Ambulatory Referral for Lung Cancer Scre    Referral Priority:   Routine    Referral Type:   Consultation    Referral Reason:   Specialty Services Required    Number of Visits Requested:   1   In addition, I have reviewed and discussed with patient certain preventive protocols, quality metrics, and best practice recommendations. A written personalized care plan for preventive services as well as general preventive health recommendations were provided to patient.   Quinlynn Cuthbert, CMA   04/07/2024   Return April 08, 2025 at 8:40 am, for In office Medicare Well Visit w  Wellness  Nurse.  After Visit Summary: (MyChart) Due to this being a telephonic visit, the after visit summary with patients personalized plan was offered to patient via MyChart    "

## 2024-04-17 ENCOUNTER — Other Ambulatory Visit: Payer: Self-pay | Admitting: Family Medicine

## 2024-04-24 ENCOUNTER — Other Ambulatory Visit: Payer: Self-pay | Admitting: Family Medicine

## 2024-04-29 ENCOUNTER — Other Ambulatory Visit: Payer: Self-pay | Admitting: Family Medicine

## 2024-04-29 DIAGNOSIS — F5101 Primary insomnia: Secondary | ICD-10-CM

## 2024-09-02 ENCOUNTER — Ambulatory Visit: Payer: Self-pay | Admitting: Nurse Practitioner

## 2025-04-09 ENCOUNTER — Ambulatory Visit: Payer: Self-pay
